# Patient Record
Sex: Male | Born: 1935 | Race: White | Hispanic: No | Marital: Married | Smoking: Former smoker
Health system: Southern US, Community
[De-identification: ages and names within clinical notes are randomized; demographics above are authoritative.]

## PROBLEM LIST (undated history)

## (undated) DIAGNOSIS — I4892 Unspecified atrial flutter: Secondary | ICD-10-CM

## (undated) DIAGNOSIS — F329 Major depressive disorder, single episode, unspecified: Secondary | ICD-10-CM

## (undated) DIAGNOSIS — I779 Disorder of arteries and arterioles, unspecified: Secondary | ICD-10-CM

## (undated) DIAGNOSIS — Z8701 Personal history of pneumonia (recurrent): Secondary | ICD-10-CM

## (undated) DIAGNOSIS — R51 Headache: Secondary | ICD-10-CM

## (undated) DIAGNOSIS — D151 Benign neoplasm of heart: Secondary | ICD-10-CM

## (undated) DIAGNOSIS — J449 Chronic obstructive pulmonary disease, unspecified: Secondary | ICD-10-CM

## (undated) DIAGNOSIS — A389 Scarlet fever, uncomplicated: Secondary | ICD-10-CM

## (undated) DIAGNOSIS — E781 Pure hyperglyceridemia: Secondary | ICD-10-CM

## (undated) DIAGNOSIS — I2510 Atherosclerotic heart disease of native coronary artery without angina pectoris: Secondary | ICD-10-CM

## (undated) DIAGNOSIS — I739 Peripheral vascular disease, unspecified: Secondary | ICD-10-CM

## (undated) DIAGNOSIS — Z9981 Dependence on supplemental oxygen: Secondary | ICD-10-CM

## (undated) DIAGNOSIS — I10 Essential (primary) hypertension: Secondary | ICD-10-CM

## (undated) DIAGNOSIS — C3490 Malignant neoplasm of unspecified part of unspecified bronchus or lung: Secondary | ICD-10-CM

## (undated) DIAGNOSIS — I639 Cerebral infarction, unspecified: Secondary | ICD-10-CM

## (undated) DIAGNOSIS — M1990 Unspecified osteoarthritis, unspecified site: Secondary | ICD-10-CM

## (undated) DIAGNOSIS — R519 Headache, unspecified: Secondary | ICD-10-CM

## (undated) DIAGNOSIS — E119 Type 2 diabetes mellitus without complications: Secondary | ICD-10-CM

## (undated) DIAGNOSIS — K922 Gastrointestinal hemorrhage, unspecified: Secondary | ICD-10-CM

## (undated) DIAGNOSIS — F32A Depression, unspecified: Secondary | ICD-10-CM

## (undated) DIAGNOSIS — K219 Gastro-esophageal reflux disease without esophagitis: Secondary | ICD-10-CM

## (undated) HISTORY — PX: CARDIAC CATHETERIZATION: SHX172

## (undated) HISTORY — DX: Peripheral vascular disease, unspecified: I73.9

## (undated) HISTORY — DX: Cerebral infarction, unspecified: I63.9

## (undated) HISTORY — DX: Malignant neoplasm of unspecified part of unspecified bronchus or lung: C34.90

## (undated) HISTORY — DX: Essential (primary) hypertension: I10

## (undated) HISTORY — DX: Chronic obstructive pulmonary disease, unspecified: J44.9

## (undated) HISTORY — DX: Unspecified osteoarthritis, unspecified site: M19.90

## (undated) HISTORY — DX: Atherosclerotic heart disease of native coronary artery without angina pectoris: I25.10

## (undated) HISTORY — DX: Pure hyperglyceridemia: E78.1

## (undated) HISTORY — DX: Disorder of arteries and arterioles, unspecified: I77.9

## (undated) HISTORY — DX: Personal history of pneumonia (recurrent): Z87.01

## (undated) HISTORY — DX: Benign neoplasm of heart: D15.1

---

## 1998-04-13 ENCOUNTER — Ambulatory Visit (HOSPITAL_COMMUNITY): Admission: RE | Admit: 1998-04-13 | Discharge: 1998-04-13 | Payer: Self-pay | Admitting: Cardiothoracic Surgery

## 1998-04-28 ENCOUNTER — Encounter: Payer: Self-pay | Admitting: Cardiothoracic Surgery

## 1998-05-01 ENCOUNTER — Encounter: Payer: Self-pay | Admitting: Cardiothoracic Surgery

## 1998-05-01 ENCOUNTER — Ambulatory Visit (HOSPITAL_COMMUNITY): Admission: RE | Admit: 1998-05-01 | Discharge: 1998-05-02 | Payer: Self-pay | Admitting: Cardiothoracic Surgery

## 1998-05-01 HISTORY — PX: COMBINED MEDIASTINOSCOPY AND BRONCHOSCOPY: SUR303

## 1998-05-02 ENCOUNTER — Encounter: Payer: Self-pay | Admitting: Cardiothoracic Surgery

## 1998-05-03 ENCOUNTER — Inpatient Hospital Stay (HOSPITAL_COMMUNITY): Admission: RE | Admit: 1998-05-03 | Discharge: 1998-05-09 | Payer: Self-pay | Admitting: Cardiothoracic Surgery

## 1998-05-03 ENCOUNTER — Encounter: Payer: Self-pay | Admitting: Cardiothoracic Surgery

## 1998-05-03 HISTORY — PX: PNEUMONECTOMY: SHX168

## 1998-05-04 ENCOUNTER — Encounter: Payer: Self-pay | Admitting: Cardiothoracic Surgery

## 1998-05-05 ENCOUNTER — Encounter: Payer: Self-pay | Admitting: Cardiothoracic Surgery

## 1998-05-08 ENCOUNTER — Encounter: Payer: Self-pay | Admitting: Cardiothoracic Surgery

## 1999-03-22 ENCOUNTER — Encounter: Payer: Self-pay | Admitting: Cardiothoracic Surgery

## 1999-03-22 ENCOUNTER — Encounter: Admission: RE | Admit: 1999-03-22 | Discharge: 1999-03-22 | Payer: Self-pay | Admitting: Cardiothoracic Surgery

## 1999-05-31 ENCOUNTER — Ambulatory Visit (HOSPITAL_COMMUNITY): Admission: RE | Admit: 1999-05-31 | Discharge: 1999-05-31 | Payer: Self-pay | Admitting: Family Medicine

## 1999-09-20 ENCOUNTER — Encounter: Payer: Self-pay | Admitting: Cardiothoracic Surgery

## 1999-09-20 ENCOUNTER — Encounter: Admission: RE | Admit: 1999-09-20 | Discharge: 1999-09-20 | Payer: Self-pay | Admitting: Cardiothoracic Surgery

## 2000-03-20 ENCOUNTER — Encounter: Admission: RE | Admit: 2000-03-20 | Discharge: 2000-03-20 | Payer: Self-pay | Admitting: Cardiothoracic Surgery

## 2000-03-20 ENCOUNTER — Encounter: Payer: Self-pay | Admitting: Cardiothoracic Surgery

## 2000-09-18 ENCOUNTER — Encounter: Admission: RE | Admit: 2000-09-18 | Discharge: 2000-09-18 | Payer: Self-pay | Admitting: Cardiothoracic Surgery

## 2000-09-18 ENCOUNTER — Encounter: Payer: Self-pay | Admitting: Cardiothoracic Surgery

## 2001-03-12 ENCOUNTER — Encounter: Admission: RE | Admit: 2001-03-12 | Discharge: 2001-03-12 | Payer: Self-pay | Admitting: Cardiothoracic Surgery

## 2001-03-12 ENCOUNTER — Encounter: Payer: Self-pay | Admitting: Cardiothoracic Surgery

## 2001-09-17 ENCOUNTER — Encounter: Payer: Self-pay | Admitting: Cardiothoracic Surgery

## 2001-09-17 ENCOUNTER — Encounter: Admission: RE | Admit: 2001-09-17 | Discharge: 2001-09-17 | Payer: Self-pay | Admitting: Cardiothoracic Surgery

## 2002-10-28 ENCOUNTER — Encounter: Payer: Self-pay | Admitting: Cardiothoracic Surgery

## 2002-10-28 ENCOUNTER — Encounter: Admission: RE | Admit: 2002-10-28 | Discharge: 2002-10-28 | Payer: Self-pay | Admitting: Cardiothoracic Surgery

## 2003-10-19 ENCOUNTER — Emergency Department (HOSPITAL_COMMUNITY): Admission: EM | Admit: 2003-10-19 | Discharge: 2003-10-19 | Payer: Self-pay | Admitting: Emergency Medicine

## 2003-11-03 ENCOUNTER — Encounter: Admission: RE | Admit: 2003-11-03 | Discharge: 2003-11-03 | Payer: Self-pay | Admitting: Cardiothoracic Surgery

## 2004-08-21 ENCOUNTER — Encounter: Admission: RE | Admit: 2004-08-21 | Discharge: 2004-09-13 | Payer: Self-pay | Admitting: Family Medicine

## 2004-11-22 ENCOUNTER — Encounter: Admission: RE | Admit: 2004-11-22 | Discharge: 2004-11-22 | Payer: Self-pay | Admitting: Cardiothoracic Surgery

## 2005-04-22 HISTORY — PX: CAROTID ENDARTERECTOMY: SUR193

## 2005-06-20 HISTORY — PX: TRANSESOPHAGEAL ECHOCARDIOGRAM: SHX273

## 2005-07-04 ENCOUNTER — Inpatient Hospital Stay (HOSPITAL_COMMUNITY): Admission: EM | Admit: 2005-07-04 | Discharge: 2005-07-18 | Payer: Self-pay | Admitting: Emergency Medicine

## 2005-07-05 ENCOUNTER — Encounter (INDEPENDENT_AMBULATORY_CARE_PROVIDER_SITE_OTHER): Payer: Self-pay | Admitting: Interventional Cardiology

## 2005-07-05 ENCOUNTER — Encounter (INDEPENDENT_AMBULATORY_CARE_PROVIDER_SITE_OTHER): Payer: Self-pay | Admitting: *Deleted

## 2005-07-12 ENCOUNTER — Encounter (INDEPENDENT_AMBULATORY_CARE_PROVIDER_SITE_OTHER): Payer: Self-pay | Admitting: Specialist

## 2005-07-12 HISTORY — PX: CORONARY ARTERY BYPASS GRAFT: SHX141

## 2005-07-21 HISTORY — PX: CARDIOVERSION: SHX1299

## 2005-08-16 ENCOUNTER — Inpatient Hospital Stay (HOSPITAL_COMMUNITY): Admission: EM | Admit: 2005-08-16 | Discharge: 2005-08-19 | Payer: Self-pay | Admitting: Emergency Medicine

## 2005-09-10 ENCOUNTER — Encounter (INDEPENDENT_AMBULATORY_CARE_PROVIDER_SITE_OTHER): Payer: Self-pay | Admitting: Specialist

## 2005-09-10 ENCOUNTER — Encounter: Admission: RE | Admit: 2005-09-10 | Discharge: 2005-09-10 | Payer: Self-pay | Admitting: General Surgery

## 2005-09-10 ENCOUNTER — Other Ambulatory Visit: Admission: RE | Admit: 2005-09-10 | Discharge: 2005-09-10 | Payer: Self-pay | Admitting: Interventional Radiology

## 2005-11-07 ENCOUNTER — Encounter: Admission: RE | Admit: 2005-11-07 | Discharge: 2005-11-07 | Payer: Self-pay | Admitting: Cardiothoracic Surgery

## 2005-12-21 HISTORY — PX: RENAL ANGIOGRAM: SHX6061

## 2005-12-30 ENCOUNTER — Ambulatory Visit (HOSPITAL_COMMUNITY): Admission: RE | Admit: 2005-12-30 | Discharge: 2005-12-30 | Payer: Self-pay | Admitting: Interventional Cardiology

## 2006-08-05 ENCOUNTER — Ambulatory Visit: Payer: Self-pay | Admitting: Vascular Surgery

## 2006-10-23 ENCOUNTER — Encounter: Admission: RE | Admit: 2006-10-23 | Discharge: 2006-10-23 | Payer: Self-pay | Admitting: Cardiothoracic Surgery

## 2006-10-23 ENCOUNTER — Ambulatory Visit: Payer: Self-pay | Admitting: Cardiothoracic Surgery

## 2007-06-02 ENCOUNTER — Emergency Department (HOSPITAL_COMMUNITY): Admission: EM | Admit: 2007-06-02 | Discharge: 2007-06-03 | Payer: Self-pay | Admitting: Emergency Medicine

## 2007-07-27 ENCOUNTER — Encounter: Admission: RE | Admit: 2007-07-27 | Discharge: 2007-07-27 | Payer: Self-pay | Admitting: Cardiology

## 2007-07-28 ENCOUNTER — Ambulatory Visit: Payer: Self-pay | Admitting: Vascular Surgery

## 2008-05-17 ENCOUNTER — Encounter: Admission: RE | Admit: 2008-05-17 | Discharge: 2008-06-02 | Payer: Self-pay | Admitting: Family Medicine

## 2008-09-13 ENCOUNTER — Ambulatory Visit: Payer: Self-pay | Admitting: Vascular Surgery

## 2008-10-06 ENCOUNTER — Ambulatory Visit: Payer: Self-pay | Admitting: Cardiothoracic Surgery

## 2009-10-05 ENCOUNTER — Ambulatory Visit: Payer: Self-pay | Admitting: Cardiothoracic Surgery

## 2009-10-05 ENCOUNTER — Encounter: Admission: RE | Admit: 2009-10-05 | Discharge: 2009-10-05 | Payer: Self-pay | Admitting: Cardiothoracic Surgery

## 2009-10-17 ENCOUNTER — Ambulatory Visit: Payer: Self-pay | Admitting: Vascular Surgery

## 2010-01-19 ENCOUNTER — Observation Stay (HOSPITAL_COMMUNITY): Admission: EM | Admit: 2010-01-19 | Discharge: 2010-01-22 | Payer: Self-pay | Admitting: Emergency Medicine

## 2010-01-21 ENCOUNTER — Ambulatory Visit: Payer: Self-pay | Admitting: Vascular Surgery

## 2010-01-21 ENCOUNTER — Encounter (INDEPENDENT_AMBULATORY_CARE_PROVIDER_SITE_OTHER): Payer: Self-pay | Admitting: Internal Medicine

## 2010-05-13 ENCOUNTER — Encounter: Payer: Self-pay | Admitting: Cardiothoracic Surgery

## 2010-07-05 LAB — BASIC METABOLIC PANEL
CO2: 30 mEq/L (ref 19–32)
Calcium: 9 mg/dL (ref 8.4–10.5)
Calcium: 9.1 mg/dL (ref 8.4–10.5)
Creatinine, Ser: 1.04 mg/dL (ref 0.4–1.5)
GFR calc Af Amer: >60 mL/min (ref >60)
GFR calc Af Amer: >60 mL/min (ref >60)
GFR calc non Af Amer: >60 mL/min (ref >60)
GFR calc non Af Amer: >60 mL/min (ref >60)
Glucose, Bld: 105 mg/dL — ABNORMAL HIGH (ref 70–99)
Potassium: 3.7 mEq/L (ref 3.5–5.1)
Sodium: 133 mEq/L — ABNORMAL LOW (ref 135–145)

## 2010-07-05 LAB — LIPASE, BLOOD: Lipase: 33 U/L (ref 11–59)

## 2010-07-05 LAB — CBC
HCT: 39.7 % (ref 39.0–52.0)
HCT: 42.7 % (ref 39.0–52.0)
HCT: 42.7 % (ref 39.0–52.0)
Hemoglobin: 13.2 g/dL (ref 13.0–17.0)
Hemoglobin: 13.9 g/dL (ref 13.0–17.0)
Hemoglobin: 14.3 g/dL (ref 13.0–17.0)
MCH: 31.1 pg (ref 26.0–34.0)
MCHC: 32.6 g/dL (ref 30.0–36.0)
MCHC: 33.2 g/dL (ref 30.0–36.0)
MCHC: 33.5 g/dL (ref 30.0–36.0)
MCV: 93.6 fL (ref 78.0–100.0)
RBC: 4.24 MIL/uL (ref 4.22–5.81)
RBC: 4.52 MIL/uL (ref 4.22–5.81)
RBC: 4.58 MIL/uL (ref 4.22–5.81)
WBC: 10.6 10*3/uL — ABNORMAL HIGH (ref 4.0–10.5)

## 2010-07-05 LAB — MAGNESIUM: Magnesium: 2.2 mg/dL (ref 1.5–2.5)

## 2010-07-05 LAB — POCT CARDIAC MARKERS
Myoglobin, poc: 125 ng/mL (ref 12–200)
Troponin i, poc: <0.05 ng/mL (ref 0.00–0.09)

## 2010-07-05 LAB — LIPID PANEL
Cholesterol: 217 mg/dL — ABNORMAL HIGH (ref 0–200)
HDL: 43 mg/dL (ref >39)
LDL Cholesterol: 131 mg/dL — ABNORMAL HIGH (ref 0–99)
Total CHOL/HDL Ratio: 5 RATIO
Triglycerides: 215 mg/dL — ABNORMAL HIGH (ref <150)

## 2010-07-05 LAB — CARDIAC PANEL(CRET KIN+CKTOT+MB+TROPI)
CK, MB: 3.2 ng/mL (ref 0.3–4.0)
CK, MB: 3.6 ng/mL (ref 0.3–4.0)
Relative Index: 2.1 (ref 0.0–2.5)
Relative Index: 2.6 — ABNORMAL HIGH (ref 0.0–2.5)

## 2010-07-05 LAB — GLUCOSE, CAPILLARY
Glucose-Capillary: 107 mg/dL — ABNORMAL HIGH (ref 70–99)
Glucose-Capillary: 114 mg/dL — ABNORMAL HIGH (ref 70–99)
Glucose-Capillary: 127 mg/dL — ABNORMAL HIGH (ref 70–99)
Glucose-Capillary: 86 mg/dL (ref 70–99)
Glucose-Capillary: 89 mg/dL (ref 70–99)

## 2010-07-05 LAB — TSH: TSH: 1.396 u[IU]/mL (ref 0.350–4.500)

## 2010-07-05 LAB — PROTIME-INR: Prothrombin Time: 13.2 seconds (ref 11.6–15.2)

## 2010-07-05 LAB — COMPREHENSIVE METABOLIC PANEL
ALT: 22 U/L (ref 0–53)
AST: 34 U/L (ref 0–37)
Alkaline Phosphatase: 61 U/L (ref 39–117)
CO2: 30 mEq/L (ref 19–32)
Calcium: 9.3 mg/dL (ref 8.4–10.5)
Chloride: 98 mEq/L (ref 96–112)
GFR calc Af Amer: >60 mL/min (ref >60)
GFR calc non Af Amer: >60 mL/min (ref >60)
Potassium: 3.9 mEq/L (ref 3.5–5.1)
Sodium: 137 mEq/L (ref 135–145)

## 2010-07-05 LAB — POCT I-STAT, CHEM 8
BUN: 18 mg/dL (ref 6–23)
Calcium, Ion: 1.17 mmol/L (ref 1.12–1.32)
Creatinine, Ser: 1.1 mg/dL (ref 0.4–1.5)
TCO2: 31 mmol/L (ref 0–100)

## 2010-07-05 LAB — DIFFERENTIAL
Basophils Relative: 1 % (ref 0–1)
Eosinophils Absolute: 0.3 10*3/uL (ref 0.0–0.7)
Eosinophils Relative: 3 % (ref 0–5)
Lymphs Abs: 1.9 10*3/uL (ref 0.7–4.0)

## 2010-07-05 LAB — D-DIMER, QUANTITATIVE: D-Dimer, Quant: 2.7 ug/mL-FEU — ABNORMAL HIGH (ref 0.00–0.48)

## 2010-07-05 LAB — T4, FREE: Free T4: 1.24 ng/dL (ref 0.80–1.80)

## 2010-07-05 LAB — CK TOTAL AND CKMB (NOT AT ARMC)
CK, MB: 4.5 ng/mL — ABNORMAL HIGH (ref 0.3–4.0)
Total CK: 151 U/L (ref 7–232)

## 2010-09-04 NOTE — Procedures (Signed)
CAROTID DUPLEX EXAM   INDICATION:  Carotid artery disease.   HISTORY:  Diabetes:  Yes.  Cardiac:  CABG.  Hypertension:  Yes.  Smoking:  Previous.  Previous Surgery:  Right carotid endarterectomy on 07/12/2005.  CV History:  Asymptomatic.  Amaurosis Fugax No, Paresthesias No, Hemiparesis No                                       RIGHT             LEFT  Brachial systolic pressure:         128               130  Brachial Doppler waveforms:         Normal            Normal  Vertebral direction of flow:        Antegrade         Antegrade  DUPLEX VELOCITIES (cm/sec)  CCA peak systolic                   83                117  ECA peak systolic                   99                132  ICA peak systolic                   58                167  ICA end diastolic                   16                45  PLAQUE MORPHOLOGY:                  Mixed             Calcific  PLAQUE AMOUNT:                      Mild              Moderate  PLAQUE LOCATION:                    CCA               ICA/ECA/CCA   IMPRESSION:  1. Patent right carotid endarterectomy site with no right internal      carotid artery stenosis noted.  2. Doppler velocity suggests a 40-59% stenosis of the left internal      carotid artery; however, this may be underestimated due to calcific      shadowing.  3. Velocities of the left internal carotid artery appear less than      previously recorded when compared to the previous exam on      07/28/2007 with the right internal carotid artery remaining stable.       ___________________________________________  Quita Skye. Hart Rochester, M.D.   CH/MEDQ  D:  09/14/2008  T:  09/14/2008  Job:  [id number]

## 2010-09-04 NOTE — Procedures (Signed)
CAROTID DUPLEX EXAM   INDICATION:  Followup, carotid artery disease.   HISTORY:  Diabetes:  No.  Cardiac:  CABG on 07/12/05.  Hypertension:  Yes.  Smoking:  Quit.  Previous Surgery:  Right CEA with DPA on 07/12/05 by Dr. Hart Rochester.  CV History:  Asymptomatic.  Amaurosis Fugax No, Paresthesias No, Hemiparesis No                                       RIGHT             LEFT  Brachial systolic pressure:         160               158  Brachial Doppler waveforms:         Biphasic          Biphasic  Vertebral direction of flow:        Antegrade         Antegrade  DUPLEX VELOCITIES (cm/sec)  CCA peak systolic                   91                114  ECA peak systolic                   177               197  ICA peak systolic                   83                207  ICA end diastolic                   26                56  PLAQUE MORPHOLOGY:                  Mixed             Calcified with  shadow  PLAQUE AMOUNT:                      Mild              Moderate  PLAQUE LOCATION:                    ECA/CCA           ICA/ECA   IMPRESSION:  1. Right internal carotid artery shows no evidence of restenosis,      status post carotid endarterectomy.  2. Left internal carotid artery shows evidence of 60-79% stenosis (low      end of range) an increase from the previous study.  3. Bilateral external carotid artery stenosis.  4. Known right thyroid nodule noted.     ___________________________________________  Quita Skye Hart Rochester, M.D.   AS/MEDQ  D:  07/28/2007  T:  07/28/2007  Job:  [id number]

## 2010-09-04 NOTE — Assessment & Plan Note (Signed)
OFFICE VISIT   Calvin [name], Calvin [name]  DOB:  1932-11-25                                        October 05, 2009  CHART #:  [id number]   HISTORY:  The patient returns to the office today in followup for his  lung cancer and history of myxoma resection.  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  After consultation with Oncology, he received no  further treatment and now is 11 years post resection without evidence of  recurrence.  In 2007, he underwent coronary artery bypass grafting,  excision of an atrial myxoma, and concomitant right carotid  endarterectomy.  Considering his previous history and age of 51 years,  he is doing reasonably well, though he still bothered by exertional  shortness of breath.  He is able to do some yard work, but notes that  climbing stairs or climbing hills quickly fatigues him.  He has had no  hemoptysis, no weight loss.   REVIEW OF SYSTEMS:  The patient denies constitutional symptoms.  Does  have shortness of breath.  Denies hemoptysis.  Denies amaurosis or TIAs.  He used to see Dr. Hart Rochester in several weeks for followup carotid  ultrasound.  He denies change in bowel habits.  Denies any blood in  urine.  Denies pedal edema.  Denies psychiatric history.   CURRENT MEDICATIONS:  He continues on:  1. Bystolic 10 mg a day.  2. Felodipine 10 mg a day.  3. Metformin 500 mg a day.  4. Aspirin 325 a day.  5. Multivitamin.  6. Fish oil.  7. Combivent.  8. He had been on Crestor, currently not on statin because of muscle      aches.  Also, on losartan and hydrochlorothiazide.   PHYSICAL EXAMINATION:  General:  The patient appears unchanged from seen  last year.  Considering his age, he still seems fairly robust.  Vital  Signs:  Blood pressure is 139/80, pulse is 72, respiratory rate is 18,  and O2 sat on room air is 88%.  Lungs:  His right lung is clear.  He  does not have any  cervical, supraclavicular, or axillary adenopathy.  Cardiac:  Regular rate and rhythm.  I do not appreciate any murmur.  Abdomen:  Moderate obesity without palpable masses.  Extremities:  Lower  extremities are without swelling.  He does have +1 DP and PT pulses.   DIAGNOSTIC TESTS:  Chest x-ray shows only postoperative findings of the  left pneumonectomy, otherwise are unchanged.   IMPRESSION:  The patient now 11 years after resection of a T2 N1 M0 3.2-  cm squamous cell carcinoma of the lung and subsequent coronary artery  bypass and myxoma resection with concomitant right carotid  endarterectomy in 2007.  Overall, the patient continues to do well.  He  is currently not on home oxygen and doing relatively well, though if he  becomes more dyspneic, may require home oxygen with.  He notes that he  is to have a followup echocardiogram done in Dr. Amil Amen' office next  week.  I will plan to see him back in 1 year with a followup chest x-  ray.   Sheliah Plane, MD  Electronically Signed   EG/MEDQ  D:  10/05/2009  T:  10/05/2009  Job:  [id number]   cc:   Quita Skye. Hart Rochester, M.D.  Francisca December, M.D.

## 2010-09-04 NOTE — Assessment & Plan Note (Signed)
OFFICE VISIT   Calvin [name], Calvin [name]  DOB:  03/08/1933                                       09/13/2008  EAVWU#:[id number]   The patient returns today for his annual follow-up regarding his carotid  occlusive disease.  He underwent a right carotid endarterectomy by me in  conjunction with removal of an atrial myxoma with Dr. Tyrone Sage in 2007.  His right carotid has remained widely patent and duplex scan today  continues to show no evidence of restenosis.  He does have some moderate  narrowing in the left internal carotid, which we have been following,  and again today the stenosis appears stable.  He denies any neurologic  symptoms such as aphasia, amaurosis fugax, diplopia, blurred vision or  syncope.  He is also not having any chest pain but does have some mild  dyspnea on exertion since he only has 1 more.  Lower extremity symptoms  are absent, including rest pain, claudication or infection.  He is  taking 1 aspirin a day.   PHYSICAL EXAMINATION:  Blood pressure is 144/68, heart rate 68,  respirations 14.  His carotid pulses are 3+ with soft bruit on the left.  Cardiovascular exam reveals a regular rhythm, no murmurs.  Abdomen is  soft, nontender, with no masses.  Has 3+ femoral and dorsalis pedis  pulses bilaterally.   I reassured him regarding these carotid findings, and we will see him on  an annual basis on the carotid protocol to follow the moderate left  internal carotid stenosis.  If he develops any symptoms in the interim,  he will be in touch with [place].   Quita Skye Hart Rochester, M.D.  Electronically Signed   JDL/MEDQ  D:  09/13/2008  T:  09/14/2008  Job:  [id number]

## 2010-09-04 NOTE — Assessment & Plan Note (Signed)
OFFICE VISIT   Calvin [name], KLUTTZ  DOB:  03/26/1933                                        October 23, 2006  CHART #:  [id number]   [name] returns today for a followup visit primarily for followup  yearly visit for previous carcinoma of the lung which was resected in  January of 2000, invasive squamous cell carcinoma, 3.2 cm, for which a  pneumonectomy was performed.  Since that time the patient has had no  evidence of recurrence, now more than 8 years later.  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  In  spite of this, he seems to continue to do reasonably well.  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   He had been complaining of headaches and this is being evaluated.  A CT  scan of the head was done.  He also notes that an echocardiogram was  done Aug 28, 2006, by Dr. Amil Amen.  I do not have the reports on these,  but the patient notes that he was told they were okay.   On exam today, his blood pressure 152/72, pulse of 56, respiratory rate  is 18, O2 sat is 93%.  Sternum:  Stable and well healed.  I do not  appreciate any murmurs.  Right Lung:  Breath sounds are clear.  He has  no pedal edema.   Followup chest x-ray shows no evidence of recurrence.   The patient continues on amiodarone 200 mg a day.  He will ask Dr.  Amil Amen about decreasing or potentially stopping this if it is no longer  felt to be necessary.  He continues using Combivent 2 puffs once a day  and rarely a second time per day.   Overall, I am very pleased with his progress.  I plan to see him back in  1 year with a followup chest x-ray.   Sheliah Plane, MD  Electronically Signed   EG/MEDQ  D:  10/23/2006  T:  10/23/2006  Job:  [id number]   cc:   Francisca December, M.D.  Vianne Bulls, M.D.

## 2010-09-04 NOTE — Assessment & Plan Note (Signed)
OFFICE VISIT   TRASON, SHIFFLET  DOB:  10-Dec-1932                                       07/28/2007  ZOXWR#:[id number]   Patient is two years status post right carotid endarterectomy done in  conjunction with coronary artery bypass grafting and removal of an  atrial myxoma by Dr. Tyrone Sage.  He has been followed for a moderate left  internal carotid stenosis, which remains asymptomatic.  He denies any  neurologic symptoms such as hemiparesis, aphasia, amaurosis fugax,  diplopia, blurred vision, or syncope.  He complains of some left thigh  discomfort which feels tight, particularly at night when he goes to  bed, with no calf or foot symptoms and no numbness or tingling.  He has  no history of nonhealing ulcers.  He has no specific claudication-type  symptoms.  He does take one aspirin per day.   PHYSICAL EXAMINATION:  Blood pressure 151/72, heart rate 65,  respirations 14.  His carotid pulses are 3+ with soft bruit on the left.  Neurologic exam is normal.  No palpable adenopathy in the neck.  Chest:  Clear to auscultation.  Abdomen is soft and nontender with no masses.  He has 3+ femoral, popliteal, and posterior tibial pulses palpable  bilaterally.   Carotid duplex exam reveals slight worsening of his left internal  carotid artery stenosis in the 70% range with the right internal carotid  being widely patent post endarterectomy.  He was reassured regarding  these findings and will return in one year for a follow-up carotid  duplex exam unless he has symptoms in the interim.   Quita Skye Hart Rochester, M.D.  Electronically Signed   JDL/MEDQ  D:  07/28/2007  T:  07/29/2007  Job:  [id number]   cc:   C. Duane Lope, M.D.

## 2010-09-04 NOTE — Assessment & Plan Note (Signed)
OFFICE VISIT   Calvin, [name]  DOB:  Jul 10, 1932                                        October 06, 2008  CHART #:  [id number]   The patient returns to the office today for followup visit and review of  his chest x-ray.  He is a patient well-known, who in January 2000  underwent a left pneumonectomy for a 3.2-cm T2, N1, M0 invasive squamous  cell carcinoma after consultation with Oncology.  The patient received  no further treatment and has done well from an Oncology point of view  without evidence of recurrence, now 10 years following resection.  In  2007 he underwent coronary artery bypass grafting and excision of atrial  myxoma.  Overall, the patient continues to do reasonably well.  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   PHYSICAL EXAMINATION:  VITAL SIGNS:  His blood pressure is 125/69, pulse  64, respiratory rate 18, and O2 sats 90%.  LUNGS:  He has no breath sounds over the left, clear over the right  without wheezing.  NECK:  I do not appreciate any cervical or supraclavicular adenopathy.  He has a well-healed right carotid endarterectomy incision.  CARDIAC:  I do not appreciate any murmur or gallop.  ABDOMEN:  Protuberant abdomen without palpable masses.  EXTREMITIES:  He has no edema in lower extremity.   Chest x-ray shows opacity of the left lung, left pleural space.  There  is no evidence of lesions in the right lung.   The patient continues on Bystolic, Benicar, felodipine, metformin,  aspirin, multivitamins, fish oil, Combivent, Crestor, Zyrtec, and Zantac  p.r.n.  Overall, he seems to be doing reasonably well, but with  borderline O2 saturations at rest, but remaining active.  I discussed  with the patient if he continues to have shortness of breath with  exertion to discuss with Dr. Tenny Craw' referral to  Pulmonology and consideration of home oxygen or  modification of his  current bronchodilator therapy.  I will plan to see him back in 1 year  with a followup chest x-ray.   Sheliah Plane, MD  Electronically Signed   EG/MEDQ  D:  10/06/2008  T:  10/07/2008  Job:  [id number]   cc:   C. Duane Lope, M.D.  Francisca December, M.D.

## 2010-09-04 NOTE — Procedures (Signed)
CAROTID DUPLEX EXAM   INDICATION:  Follow up known carotid disease.   HISTORY:  Diabetes:  Yes.  Cardiac:  CABG.  Hypertension:  Yes.  Smoking:  Previous.  Previous Surgery:  Right carotid endarterectomy on 07/12/05.  CV History:  Asymptomatic.  Amaurosis Fugax No, Paresthesias No, Hemiparesis No.                                       RIGHT             LEFT  Brachial systolic pressure:         130               130  Brachial Doppler waveforms:           Normal          Normal  Vertebral direction of flow:        Antegrade         Antegrade  DUPLEX VELOCITIES (cm/sec)  CCA peak systolic                   123               129  ECA peak systolic                   212               216  ICA peak systolic                   122               173  ICA end diastolic                   27                56  PLAQUE MORPHOLOGY:                  Mixed             Calcific  PLAQUE AMOUNT:                      Mild              Moderate  PLAQUE LOCATION:                    CCA               ICA, CCA, ECA   IMPRESSION:  1. Patent right carotid, post carotid endarterectomy.  2. Doppler velocities suggest 40% to 59% stenosis in the left internal      carotid artery.  3. Antegrade flow in bilateral vertebral arteries.  4. No significant changes from previous examination.   ___________________________________________  Quita Skye Hart Rochester, M.D.   NT/MEDQ  D:  10/17/2009  T:  10/17/2009  Job:  [id number]

## 2010-09-07 NOTE — Consult Note (Signed)
NAME:  Calvin [name], Calvin [name] NO.:  [id number]   MEDICAL RECORD NO.:  [id number]          PATIENT TYPE:  INP   LOCATION:  3729                         FACILITY:  [facility]   PHYSICIAN:  Sheliah Plane, MD    DATE OF BIRTH:  05-Aug-1932   DATE OF CONSULTATION:  07/05/2005  DATE OF DISCHARGE:  02/03/2005                                   CONSULTATION   FOLLOWUP CARDIOLOGIST:  Lyn Records, M.D.   PRIMARY CARE PHYSICIAN:  C. Duane Lope, M.D.   REASON FOR CONSULTATION:  Question left atrial mass.   HISTORY OF PRESENT ILLNESS:  The patient is a 77 year old male known to me  for at least the past 7 years, who presented with carcinoma of the left lung  and underwent left pneumonectomy. Since that time he has been seen on a  regular basis with followup chest x-rays and has done well with the  exception of mild shortness of breath with exertion. He comes to the  emergency room on July 04, 2005, because of worsening left-sided chest pain  and increasing shortness of breath especially at rest which he has not  previously had. He admitted that over the past several days having some  nausea, dizziness and on-and-off headaches.   PAST MEDICAL HISTORY:  1.  History of lung cancer, resected with pneumonectomy 7 years ago. The      patient's old chart is still not available from [facility] and will      have to obtain these records from my office.  2.  History of hypertension.  3.  History of hypertriglyceridemia. He denies diabetes.   PAST SURGICAL HISTORY:  As noted above; no other surgery.   SOCIAL AND FAMILY HISTORY:  The patient smoked for 40 years, quit prior to  his pneumonectomy and has not smoked since. He denies alcohol use. He worked  as a Naval architect. He notes he has a sister with heart rate problems. Mother  died of myocardial infarction. Father died of stomach cancer.   ALLERGIES:  AMOXICILLIN, BEE VENOM.   MEDICATIONS:  1.  Aspirin 81 mg a day.  2.   Multivitamins.  3.  Zetia 10 mg a day.  4.  Combivent 2 puffs.  5.  Benicar 40/12.5.   REVIEW OF SYSTEMS:  The patient denies any change in weight. He denies any  fever, chills, night sweats. As noted above, he has had increasing shortness  of breath even at rest which is new. Denies hemoptysis. Does occasionally  have wheezing. He denies vomiting, but has recently had nausea. Wife relates  that his blood pressure has been up and down. Has had no melena,  hematochezia or diarrhea. He denies psychiatric history, denies  claudication, denies amaurosis or TIAs. He does have a history of some  carotid disease which he has been followed by carotid Doppler's in the past  by Dr. Hart Rochester.   PHYSICAL EXAMINATION:  VITAL SIGNS:  The patient's temperature is 98.6,  blood pressure is 120/70, pulse of 80, respiratory rate 18, saturations 95%  on room air.  GENERAL:  The patient is awake and alert, neurologically intact, just  completing a transesophageal echocardiogram. He is hard of hearing.  NECK:  Without jugular venous distention.  CHEST:  Has no breath sounds on the left. There is no wheezing over the  right. The left chest incision is well healed.  HEART:  He has a regular S1 and S2 with no S3 or S4. He has an early  systolic murmur heard along the left sternal border.  ABDOMEN:  Soft, nontender, without palpable masses or tenderness.  NEUROLOGIC EXAM:  Grossly intact.   LABORATORY FINDINGS:  On admission showed hematocrit of 40. Creatinine of  1.0. Fibrin split products were elevated at 1.7. Chest x-ray is unchanged  from previous films, with postoperative changes. Since admission it was  noted the patient had an elevated D-dimer. A CT scan was done to rule out  possibility of pulmonary embolus. There was no evidence of pulmonary  embolus. He had some marginally enlarged lymph nodes and question of  bilateral adenomas, adrenal adenomas. There was a question of a thrombus in  the left  atrium which has led to echocardiography. He had a TEE this  afternoon which he just returned from that has a 1.5-cm pedunculated mass in  the left atrium, smooth appearing, freely mobile, but not interfering with  the function of the mitral valve; there is very trace mitral regurgitation.  The patient also had an adenosine stress test which was negative for  ischemia.   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 There is an outside  possibility that this mass could be clot, but it certainly appears with  echocardiography to be a myxoma.   RECOMMENDATION:  At this point I would recommend obtaining pulmonary  function studies, cardiology to continue to work up including cardiac  catheterization and presumably will lead to cardiac surgery for removal of  the myxoma. However, before surgery, because of his age of 40 years, history  of smoking, he would be at high risk for coronary disease in spite of a  negative adenosine stress test. After this information is obtained, we will  further discuss with the patient his options.      Sheliah Plane, MD  Electronically Signed     EG/MEDQ  D:  07/05/2005  T:  07/05/2005  Job:  [id number]

## 2010-09-07 NOTE — Op Note (Signed)
NAME:  Calvin [name], Calvin [name] NO.:  [id number]   MEDICAL RECORD NO.:  [id number]          PATIENT TYPE:  INP   LOCATION:  3742                         FACILITY:  [facility]   PHYSICIAN:  Francisca December, M.D.  DATE OF BIRTH:  [birth year]   DATE OF PROCEDURE:  08/16/2005  DATE OF DISCHARGE:                                 OPERATIVE REPORT   PROCEDURE:  Direct current cardioversion.   INDICATIONS:  Mr. [name] is a 77 year old man now five weeks S/P single-  vessel CABG, left atrial myxoma excision, bilateral carotid endarterectomy,  with a history of normal LV systolic function.  He had the abrupt onset of  tachypalpitation yesterday evening.  He came to the hospital and was found  to be in atrial flutter with 2:1 block.  He has been resistant to IV  amiodarone and IV Cardizem as far as heart rate control.  He is to undergo  elective cardioversion at this time.   ANESTHESIA:  Quita Skye. Krista Blue, M.D., 150 mg of Pentothal IV.   FINDINGS:  While monitoring, heart rate, blood pressure, O2 saturation and  ECG and under the direct supervision of Dr. Claybon Jabs of the anesthesia  department, the patient received 150 mg of IV Pentothal.  After establishing  deep anesthesia, the patient received a single dose of transthoracic  biphasic synchronized direct current energy at 50 joules.  There was prompt  return to sinus rhythm.   IMPRESSION:  Successful elective cardioversion, atrial flutter 2:1 block to  sinus rhythm.   PLAN:  Continue IV amiodarone and IV Cardizem, the latter at 5 mg/hr.  Begin  p.o. amiodarone.  Continue Lovenox x24 hours more and continue telemetry  monitoring.      Francisca December, M.D.  Electronically Signed     JHE/MEDQ  D:  08/16/2005  T:  08/17/2005  Job:  [id number]

## 2010-09-07 NOTE — Op Note (Signed)
NAME:  Calvin [name], Calvin [name] NO.:  [id number]   MEDICAL RECORD NO.:  [id number]          PATIENT TYPE:  INP   LOCATION:  2306                         FACILITY:  [facility]   PHYSICIAN:  Burna Forts, M.D.DATE OF BIRTH:  08-Jul-1932   DATE OF PROCEDURE:  07/12/2005  DATE OF DISCHARGE:                                 OPERATIVE REPORT   PROCEDURE PERFORMED:  Transesophageal echocardiogram.   INDICATIONS FOR PROCEDURE:  Mr. Kamel is a 77 year old gentleman who presents  today for coronary artery bypass grafting, carotid endarterectomy and  possible excision of an atrial myxoma.  This is being performed by Sheliah Plane, MD.  On the morning of surgery the patient was brought to the  holding area where under local anesthesia with sedation, pulmonary catheter  and radial arterial lines were placed.  He was taken to the operating room  for routine induction of general anesthesia.  After induction, the TEE probe  was protected, lubricated and passed oropharyngeally into the stomach and  slightly withdrawn for imaging of the cardiac structures.   PRE CARDIOPULMONARY BYPASS TEE EXAMINATION:  Left ventricle.  This was  mildly thickened, concentrically thickened.  Left ventricular chamber seen  in a short axis view.  Papillary muscles are well outlined.  There is good  overall contractility with just some suggestion of inferior wall  hypokinesis.  Overall assessment of ejection fraction would be good and  above 40%.  Long axis views were obtained as well, again showing well  anterior posterior aspects of the heart in the apical areas.  Again,  satisfactory contractility is noted.   Mitral valve.  There is a mildly thickened anterior leaflet noted.  Posterior leaflet was also seen with just a mild degree of prolapse.  There  is good overall opening of the mitral valve during diastolic inflow and  appropriate coaptation during the systolic ejection phase.  Doppler  examination  reveals only trace mitral regurgitant flow noted.  Several small  jets were noted in multiple views that were obtained.   Left atrium.  In the left atrial chamber now is seen a fairly large circular  to round solid mass that appears to be attached by a very small stalk just  behind the area of the left atrium in the proximity of the aortic valve on  the screen.  It appears to be near the interatrial septal area of the wall.  It does appear to be on a small stalk.  It is quite mobile and moving  throughout the left atrial chamber.  Its size is approximately 2.2 cm in  length and about 1.5 cm in its diameter.  The interatrial septum wall is not  able to be visualized completely.  There do not appear to be shunts or  regurgitant flow areas noted.  The size of this mass, however, does not  obstruct mitral inflow.   Aortic valve.  This is a mildly aortic sclerotic looking valve.  The three  cusps are seen well.  Edges were seen well, satisfactorily and appropriately  during systolic ejection.  There is no ejection flow during systolic outflow  and there is no diastolic insufficiency noted.   Right ventricle.  Normal right ventricular chamber is appreciated.   Tricuspid valve.  Normal, thin, compliant, mobile, tricuspid valve.   Right atrium.  Normal right atrial chamber.  Pulmonary artery catheter noted  within.   Patient placed on cardiopulmonary bypass.  Coronary artery bypass grafting  was carried out.  Carotid endarterectomy had already been performed.  The  atrial area had been incised and the atrial myxoma was excised by Dr.  Tyrone Sage.  Deairing maneuvers were carried out.  Closures carried out.  Patient was rewarmed and separated from cardiopulmonary bypass with the  initial attempt.   POST CARDIOPULMONARY BYPASS TEE EXAMINATION:  Left ventricle.  Newly  bypassed.  There is small left ventricular chamber indicative of a low  volume status; however, there is good excellent left  ventricular chamber  function seen in the short axis view with all segmental wall areas  contractile and thickening.   Mitral valve.  Essentially unchanged from the prebypass period.   Left atrium.  The area of the myxoma is interrogated in detail.  There did  not appear to be any remnants, fragments or additional shunt areas of  imperfections noted in the area of the left atrial chamber where the myxoma  was excised.  Obviously the myxoma itself had been removed.   Rest of the cardiac examination was as previously described.  Patient did  satisfactory and was returned to cardiac intensive care unit in stable  condition.           ______________________________  Burna Forts, M.D.     JTM/MEDQ  D:  07/12/2005  T:  07/15/2005  Job:  [id number]

## 2010-09-07 NOTE — Discharge Summary (Signed)
NAME:  Calvin [name], Calvin [name] NO.:  [id number]   MEDICAL RECORD NO.:  [id number]          PATIENT TYPE:  INP   LOCATION:  2008                         FACILITY:  [facility]   PHYSICIAN:  Sheliah Plane, MD    DATE OF BIRTH:  [birth year]   DATE OF ADMISSION:  07/04/2005  DATE OF DISCHARGE:  07/18/2005                                 DISCHARGE SUMMARY   PRIMARY DIAGNOSES:  1.  Critical carotid artery disease.  2.  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  3.  Left atrial myxoma.   IN-HOSPITAL DIAGNOSES:  1.  Diabetes mellitus type 2.  2.  Acute blood loss anemia postoperatively.  3.  Volume overload postoperatively.   SECONDARY DIAGNOSES:  1.  Status post left pneumonectomy for squamous cell carcinoma seven years      ago.  2.  Hypertension.  3.  Hyperlipidemia.   ALLERGIES:  AMOXICILLIN and BEE VENOM.   IN-HOSPITAL OPERATIONS/PROCEDURES:  1.  Cardiac catheterization with left heart catheterization, coronary      angiography, left ventriculogram.  2.  Right carotid endarterectomy with Dacron patch angioplasty.  3.  Coronary artery bypass grafting x1 with reverse saphenous vein graft to      the distal right coronary artery.  4.  Excision of left atrial myxoma.  5.  Intraoperative transesophageal echocardiogram.   HISTORY AND PHYSICAL/HOSPITAL COURSE:  The patient is a 77 year old male who  approximately seven years ago had undergo left pneumonectomy for squamous  cell carcinoma of the lung.  The patient has done well since.  He recently  presented to the emergency room with onset of right chest pain.  CT scan was  done to rule out pulmonary embolism which showed to be negative.  No  evidence of recurrent carcinoma was noted as well.  It did show a vague  filling defect in left atrium.  Following CT scan, echocardiogram was  ordered including a transesophageal echocardiogram which confirmed a  moderate-sized left atrial myxoma.  Following  this, a cardiac  catheterization was done and this was showed the patient to have sequential  critical right coronary artery lesions.  He was also found to have severe  carotid disease greater than 80-90% on the right and to a lesser degree on  the left.  Also noted a small left kidney with severe left renal artery  stenosis and normal size right kidney with moderate renal artery stenosis.  Following these studies, Dr. Tyrone Sage was consulted.   Dr. Tyrone Sage discussed with the patient undergoing coronary artery bypass  grafting as well as excision of the left atrial myxoma.  Dr. Tyrone Sage  discussed risks and benefits with the patient.  The patient acknowledged  understanding and agreed to proceed.  On the following evaluation of  bilateral carotid duplex ultrasound, the right showing 80-99% ICA stenosis.  Vascular surgery was consulted.  The patient was seen and evaluated by Dr.  Hart Rochester.  Dr. Hart Rochester discussed with the patient undergoing right carotid  endarterectomy.  He discussed risks and benefits.  The patient acknowledged  understanding and agreed  to proceed.  Surgery was scheduled for  March23,2007.  Prior to surgery, the patient had no complications.   The patient was taken to the operating room on March23,2007 where he  underwent coronary bypass grafting x1 with reverse saphenous vein graft to  the distal right coronary artery and excision of left atrial myxoma done  concomitantly with right carotid endarterectomy.  The patient tolerated  these procedures well and was transferred up to the intensive care unit in  stable condition.  Following surgery, the patient was seen to be  hemodynamically stable.  The patient was extubated later that evening early  morning following surgery.  Following extubation, the patient was seen to be  alert and oriented x3.  Neuro intact.  During the patient's postoperative  course, he did develop acute blood loss anemia.  His hemoglobin and  hematocrit  had dropped down to 7.9 and 23.6 on postoperative day #4.  Iron  pill was started at that time.  A hemoglobin and hematocrit were monitored  during the remainder of his hospital stay.  They did increase slightly and  prior to discharge, hemoglobin and hematocrit were 8.3 and 24.6.  The  patient will be discharged home on iron pills.  Also following surgery, the  patient was seen to have elevated blood sugars.  The patient's hemoglobin  A1c was checked and seen to be 6.7%.  The patient's blood sugars had been  controlled using Lantus insulin.  Due to this being new onset, the patient  was switched from the Lantus insulin to Glucophage p.o.  Blood sugars were  controlled.  He will be given a Glucometer for him prior to discharge home.  The patient also had respiratory insufficiency.  He continued to require  nasal cannula O2 with ambulation.  He was able to be off the oxygen at rest  sating greater than 90% but with ambulation would decrease O2 saturations  down to 80s.  The patient was encouraged to use his incentive spirometer.  He was restarted back on his Combivent inhaler.  After aggressive pulmonary  toilet, the patient was able to be weaned off oxygen sating greater than 90%  at rest as well as with ambulation.  The patient complains the shortness of  breath had improved as well.   The patient's chest tubes and lines were discontinued in the normal fashion.  He was out of bed ambulating well following surgery.  The patient's  incisions remained clear, dry and intact.  Neuro remained intact during his  postoperative course.  Vital signs were monitored and were well controlled.  Due to the patient's bilateral renal artery stenosis, it was recommended  during his immediate postoperative course not to start on ACE inhibitor.  Possibility of starting the patient on ACE inhibitor in several weeks.  The patient's weight was monitored during his postoperative course.  He was seen  to be  overloaded and started on diuretics.  Prior to discharge home, the  patient was back near his baseline.  He will be discharged home on  diuretics.  The patient was tolerating diet well.  No nausea, vomiting  noted.  Bowel movements within normal limits.  The patient remained afebrile  during his postoperative course.   The patient is discharged home [phone] on postoperative day #6.  A  follow-up appointment was scheduled with Dr. Tyrone Sage for [phone] 11:20  a.m.  The patient is to follow up with Dr. Katrinka Blazing in the next 1-2 weeks.  He  is to  call for an appointment.  The patient will obtain a PA and lateral  chest x-ray done before this appointment which he will then bring with him  to Dr. Dennie Maizes appointment.  Mr. Yapp received instructions on diet,  activity level, incisional care.  He was told no driving until released to  do so.  No heavy lifting over 10 pounds.  The patient is told he is allowed  to shower washing his incisions using soap and water.  He is to contact the  office if he develops any drainage or opening from any of his incision  sites.  The patient was told to ambulate 3-4 times per day, progress as  tolerated and to continue his breathing exercises.  The patient was educated  on diet to be low-fat, low-salt.   DISCHARGE MEDICATIONS:  1.  Aspirin 325 mg daily.  2.  Lopressor 25 mg b.i.d.  3.  Zetia 10 mg daily.  4.  Niferex 150 mg b.i.d.  5.  Lasix 40 mg daily x5 days.  6.  Potassium chloride 20 mEq daily x5 days.  7.  Folic acid 1 mg daily.  8.  Multivitamin daily.  9.  Combivent inhaler as used at home.  10. Glucophage 500 mg b.i.d.  11. Oxycodone 5 mg 1-2 tablets q.4-6h. p.r.n. pain.      Theda Belfast, [place]      Sheliah Plane, MD  Electronically Signed    KMD/MEDQ  D:  07/18/2005  T:  07/18/2005  Job:  [id number]   cc:   Sheliah Plane, MD  [address]  [place]  [place] [address]   Quita Skye. Hart Rochester, M.D.  [address]   [place]  [place] [address]   Lyn Records, M.D.  Fax: [phone]

## 2010-09-07 NOTE — Op Note (Signed)
NAME:  Calvin [name], Calvin [name] NO.:  [id number]   MEDICAL RECORD NO.:  [id number]          PATIENT TYPE:  INP   LOCATION:  2306                         FACILITY:  [facility]   PHYSICIAN:  Quita Skye. Hart Rochester, M.D.  DATE OF BIRTH:  03/18/1933   DATE OF PROCEDURE:  07/12/2005  DATE OF DISCHARGE:                                 OPERATIVE REPORT   PREOP DIAGNOSIS:  Severe coronary artery disease, atrial myxoma, and severe  right internal carotid stenosis.   POSTOP DIAGNOSIS:  Severe coronary artery disease, atrial myxoma, and severe  right internal carotid stenosis.   OPERATION:  For Dr. Hart Rochester, right carotid endarterectomy with Dacron patch  angioplasty.   SURGEON:  Dr. Hart Rochester.   FIRST ASSISTANT:  Sheliah Plane, MD   SECOND ASSISTANT:  Coral Ceo, PA.   Dr. Tyrone Sage will dictate the cardiac portion of the procedure.  Procedure  for Dr. Hart Rochester is right carotid endarterectomy with Dacron patch  angioplasty.   PROCEDURE:  The patient was taken to the operating room, placed in the  supine position at which time satisfactory general endotracheal anesthesia  was administered.  The neck, chest, abdomen and lower extremities were  prepped with Betadine scrub solution draped routine sterile manner after  appropriate monitoring lines were inserted by anesthesia.  Right neck  incision was made on the anterior border sternocleidomastoid muscle carried  down through subcutaneous tissue and platysma using Bovie.  The common  facial vein, external jugular veins ligated with 3-0 silk ties, divided  exposing the common, internal and external carotid arteries.  There was  calcified atherosclerotic plaque in the distal common carotid extending up  the internal carotid artery posteriorly with a severe stenotic lesion  proximally.  Distal vessel appeared normal with a diminished pulse.  #10  shunt was prepared.  The patient was heparinized.  The carotid vessels were  occluded with vascular  clamps.  Longitudinal opening made in the common  carotid 15 blade extended up the internal carotid with Potts scissors to a  point distal to the disease.  The plaque was at least 80-90% stenotic in  severity and heavily ulcerated.  #10 shunt was inserted without difficulty  reestablishing flow in about 2 minutes.  Standard endarterectomy was then  performed using elevator and Potts scissors with eversion endarterectomy the  external carotid.  The plaque feathered off the distal internal carotid  artery nicely not requiring any tacking sutures.  The lumen was thoroughly  irrigated heparin saline.  All loose debris carefully removed.  Arteriotomy  was closed with a patch using continuous 6-0 Prolene.  Prior to completion  of the closure the shunt was removed and after antegrade and retrograde  flushing the closure was completed reestablishing  flow initially up the external and up the internal branch.  Carotid was  occluded for less than 2 minutes for removal of the shunt.  No protamine was  given at this time.  The wound was packed during the cardiac portion of the  procedure and then following that Dr. Tyrone Sage closed the wound in routine  fashion.           ______________________________  Quita Skye Hart Rochester, M.D.     JDL/MEDQ  D:  07/12/2005  T:  07/15/2005  Job:  [id number]

## 2010-09-07 NOTE — Op Note (Signed)
NAME:  Calvin [name], KONDRACKI NO.:  [id number]   MEDICAL RECORD NO.:  [id number]          PATIENT TYPE:  INP   LOCATION:  2306                         FACILITY:  [facility]   PHYSICIAN:  Sheliah Plane, MD    DATE OF BIRTH:  1933-04-14   DATE OF PROCEDURE:  07/12/2005  DATE OF DISCHARGE:                                 OPERATIVE REPORT   PREOPERATIVE DIAGNOSES:  1.  Critical carotid artery disease.  2.  Right severe coronary occlusive disease with unstable angina.  3.  Left atrial myxoma.  4.  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   SURGEON:  Sheliah Plane, MD   FIRST ASSISTANT:  Stephanie Acre Dominick, [place]   PROCEDURE PERFORMED:  Coronary artery bypass grafting x 1 with reverse  saphenous vein graft to the distal right coronary artery and excision of  left atrial myxoma done concomitantly with right carotid endarterectomy by  Dr. Hart Rochester, dictated under separate note.   BRIEF HISTORY:  The patient is a 77 year old male who approximately 8 years  prior had undergone left pneumonectomy for carcinoma of the lung.  Since  that time, he has done relatively well but was admitted urgently through the  emergency room for new onset of right chest pain.  CT scan was performed to  rule out pulmonary embolus.  No evidence of recurrent carcinoma was noted.  However, t did have a vague filling defect in the left atrium.  This led to  echocardiogram and including a transesophageal echocardiogram which  confirmed a moderate-size left atrial myxoma.  A cardiac catheterization was  performed prior to resection of the left atrial myxoma and the patient was  found to have sequential critical right coronary artery lesions.  He also  was found to have severe carotid disease greater than 80-90% on the right  and to a lesser degree on the left.  He also has a small left shrunken  kidney with a severe left renal artery stenosis and a normal-sized right  kidney with  moderate renal artery stenosis.  Creatinine is 1.  Carotid  endarterectomy, coronary artery bypass grafting and resection of atrial  myxoma were recommended to the patient who agreed and signed informed  consent.  He was aware of increased risk because of his previous  pneumonectomy and limited respiratory reserve.   DESCRIPTION OF PROCEDURE:  With Swan-Ganz and arterial line and monitors in  place, the patient underwent general endotracheal anesthesia without  incident.  The skin of the chest and legs and neck was then prepped with  Betadine and draped in the usual sterile manner.  Transesophageal echo probe  confirmed the presence of a pedunculated left atrial mass.  Dictated under  separate note is description of the right carotid endarterectomy performed  first by Dr. Hart Rochester.   Following the carotid endarterectomy, endo vein was harvested from the right  thigh with Guidant scope.  The vein was of good quality and caliber.  Median  sternotomy was performed.  The right lung and mediastinum was shifted to the  left secondary  to the old pneumonectomy.  The pericardium was opened.  There  was shift of the mediastinal contents but this was able to be compensated  for and access to the aorta was possible.  The patient was systemically  heparinized.  Ascending aorta was cannulated.  Superior and inferior vena  caval cannulas were placed directly into the cava.  Umbilical Silastic tapes  were placed around the superior and inferior vena cava.  An aortic root vent  and cardioplegia needle was introduced into the ascending aorta.  The  patient was placed on cardiopulmonary bypass 2.4 L/min/M2.  The right  coronary artery distally was identified and opened and using a running 7-0  Prolene, the distal anastomosis was performed.  Additional cold blood  cardioplegia was administered down the vein.   The table tapes were then secured.  The right atrium was opened and through  the intraatrial septum  in the area of fossa ovalis, the left atrium was  entered.  This gave good visualization of a 1.5 cm left atrial myxoma with a  small pedunculated stalk.  Circumferential portion of atrial wall was  excised and the specimen removed and submitted to pathology.  The  intraatrial septum was then closed with a horizontal mattress 4-0 Prolene  suture.  Prior to complete closure of the left atrium, the left atrium was  allowed and left side of the heart was allowed to passively fill and de-air.  The closure was completed.  The right atrium was then closed with a  horizontal mattress 4-0 Prolene suture.  Prior to complete closure of the  right atrium, the aortic root vent cardioplegia needle was removed and a  single punch aortotomy was performed.  The vein graft to the distal right  coronary artery was then anastomosed to the ascending aorta.  Air was  evacuated from the graft and the aorta cross-clamp was removed.  The right  atrium was allowed to fill and de-air and the right atriotomy closure was  completed.  A second layer of running 4-0 Prolene was used on the atriotomy.  A McGoon needle was placed in the ascending aorta for further de-airing of  the heart.  Atrial and ventricular pacing wires were applied.  Wrap markers  were applied.  With the patient's body temperature rewarmed to 37 degrees,  he was ventilated and weaned from cardiopulmonary bypass without difficulty,  remained hemodynamically stable.  He was decannulated in the usual fashion.  Protamine sulfate was administered with operative field hemostatic.   Two atrial and two ventricular pacing wires were applied.  Graft markers  were applied.  The left pleural tube and two mediastinal tubes were left in  place.  The sternum was closed with #6 stainless steel wire.  Fascia closed  with interrupted 0 Vicryl running 3-0 Vicryl and subcutaneous tissue with 4- 0 subcuticular stitch in skin edges.  Dry dressings were applied.  Sponge   and needle count was reported as correct.  The patient tolerated the  procedure without obvious complication and was transferred to the surgical  intensive care unit for further postoperative care.      Sheliah Plane, MD  Electronically Signed     EG/MEDQ  D:  07/15/2005  T:  07/15/2005  Job:  [id number]   cc:   Lyn Records, M.D.  Fax: [phone]   C. Duane Lope, M.D.  Fax: [phone]

## 2010-09-07 NOTE — H&P (Signed)
NAME:  Calvin [name], Calvin [name] NO.:  [id number]   MEDICAL RECORD NO.:  [id number]          PATIENT TYPE:  EMS   LOCATION:  MAJO                         FACILITY:  [facility]   PHYSICIAN:  Melissa L. Ladona Ridgel, MD  DATE OF BIRTH:  13-Apr-1933   DATE OF ADMISSION:  07/04/2005  DATE OF DISCHARGE:                                HISTORY & PHYSICAL   CHIEF COMPLAINT:  Chest pain and shortness of breath, acute in onset.   PRIMARY CARE PHYSICIAN:  C. Duane Lope, M.D.   CARDIOLOGIST:  Lyn Records, M.D.   HISTORY OF PRESENT ILLNESS:  The patient is a 77 year old male who was up  and around this morning when he developed the acute onset of left-sided  chest pain with shortness of breath.  The patient relates that he has been  having chronic left-sided pain which seems to wax and wane and exacerbate  especially when he is eating.  He relates a lot of belching.  He states that  last week, he saw Dr. Tenny Craw in the office and complained of some dizziness,  nausea, left-sided pain for which he was given Phenergan.  The patient  persisted in having symptoms at home and therefore he came to the emergency  room.  At this time he had a significant association with shortness of  breath.   REVIEW OF SYSTEMS:  He states that he has been having on and off headaches  and he has had this left-sided chest discomfort right about where his  incision was for his pneumonectomy.  He has been dizzy on and off.  He  denied fever or chills.  He denied vomiting, but did have nausea.  His wife  relates this week that his blood pressure has been up and down.  His heart  rate also has been up and down.  The patient relates no melena, no  hematochezia, no diarrhea, no constipation.  All other review of systems  appear to be negative, except for the patient is hard of hearing.   PAST MEDICAL HISTORY:  The patient has had cancer of the left lung, question  of whether this was non small cell.  Records are not  available at this time.  He had pneumonectomy and no chemotherapy.  He has hypertension.  He denies  diabetes, but he does have high triglycerides.   PAST SURGICAL HISTORY:  7 years ago, he had a pneumonectomy.  Other than  that he has had no surgical procedures.   SOCIAL HISTORY:  He smokes for at least 40 years, but had quit around the  time of his surgery.  He denies ethanol.  He was a Naval architect by trade.   FAMILY HISTORY:  His sister has problems with a high heart rate.  Mother is  deceased with an MI, father is deceased with stomach cancer.   ALLERGIES:  AMOXICILLIN, BEE VENOM.   MEDICATIONS:  1.  Aspirin 81 mg daily.  2.  Multivitamin one tablet daily.  3.  Zetia 10 mg once daily.  4.  Combivent two puffs.  5.  Benicar 40/12.5 mg once daily.   PHYSICAL EXAMINATION:  VITAL SIGNS:  Temperature 99 at max, his current is  98.5, blood pressure 123/72, pulse 89, respirations 22, saturation 94%.  GENERAL:  The patient is in no acute distress.  He is normocephalic and  atraumatic.  Pupils equal, round, and reactive to light.  Extraocular  muscles are intact.  Mucous membranes are moist.  NECK:  Supple.  There is no JVD, but he does have a prominent stroke and a J  wave.  He is very hard of hearing.  He has dry mucous membranes.  He is  mostly edentulous with the presence of frontal lower teeth.  CHEST:  No breath sounds in the left field.  He has positive breath sounds  in the right field without rhonchi, rales, or wheezes.  CARDIOVASCULAR:  Regular rate and rhythm.  Positive S1 and S2.  No S3 or S4.  He has distant heart sounds with a 2/6 systolic ejection murmur heard at the  left sternal border and at the right second intercostal space.  ABDOMEN:  Soft, nontender, and nondistended with positive bowel sounds.  EXTREMITIES:  No cyanosis, clubbing, or edema.  He has a corn on his left  foot.  NEUROLOGY:  He has decreased hearing.  Cranial nerves II-XII grossly intact.  Power  is 5/5.  DTR's are 2+.   LABORATORY DATA:  White count 10.7, hemoglobin 14.6, hematocrit 40.2,  platelets 271, sodium 138, potassium 4.0, chloride 99, CO2 30, BUN 13,  creatinine 1.0, glucose 116, calcium 9.6.  Point of care enzymes is negative  x1. Fibrin derivatives are elevated at 1.7.   EKG shows normal sinus rhythm at 80 beats per minute with no significant ST  T wave changes.   Chest x-ray shows pneumonectomy on the left, no post surgical changes are  identified on the x-ray.   ASSESSMENT:  This is a 77 year old white male with left-sided chest pain  with associated shortness of breath, nausea, with burping which came on very  acutely this morning.  The patient is noted to have an elevated D-dimer, but  otherwise has a negative cardiac workup.   Problem 1.  Cardiovascular.  Left-sided chest pain possibly related to his  incisional scar.  However, pertinent studies that need to be elucidated  would be a chest CT to rule out PE since his D-dimer is elevated.  The  patient also should be ruled out on serial cardiac markers.  We will start  him on Lovenox 1 mg/kg, continue his aspirin, Benicar, and consider starting  Lopressor.   Problem 2.  Pulmonary.  He has a history of a pneumonectomy on the left  secondary to lung cancer, the type of which is unknown at this time.  We  will attempt to obtain his old records.  We will obtain a chest CT to rule  out PE.   Problem 3.  GI dyspepsia with frequent eructations.  The patient may need to  have a CT of the abdomen to evaluate possible sources for gastric upset as  well as pain.  Plan is to go ahead and do a chest, abdomen, and pelvis CT if  the patient is agreeable.   Problem 4.  GU.  He has a negative UA.  We will follow up his I&O's.   Problem 5.  Endocrine.  We will check a TSH.   Problem 6.  DVT prophylaxis will be with Lovenox.      Melissa L. Ladona Ridgel,  MD  Electronically Signed    MLT/MEDQ  D:  07/04/2005  T:   07/04/2005  Job:  [id number]

## 2010-09-07 NOTE — Op Note (Signed)
NAME:  Calvin [name], Calvin [name] NO.:  [id number]   MEDICAL RECORD NO.:  [id number]          PATIENT TYPE:  AMB   LOCATION:  SDS                          FACILITY:  [facility]   PHYSICIAN:  Corky Crafts, MDDATE OF BIRTH:  May 15, 1932   DATE OF PROCEDURE:  12/30/2005  DATE OF DISCHARGE:  12/30/2005                                 OPERATIVE REPORT   PROCEDURES PERFORMED:  Abdominal aortogram, bilateral selective renal  angiogram.   OPERATOR:  Corky Crafts, MD   INDICATIONS:  Refractory hypertension and renal artery stenosis by MR  angiogram.   PROCEDURE NARRATIVE:  The risks, benefits of renal angiography were  explained to the patient and informed consent was obtained.  He was brought  to the [facility] lab, placed on the table. He was prepped and draped in the usual  sterile fashion.  His right groin was infiltrated with 1% lidocaine.  A 5-  French arterial sheath was placed into his right groin using modified  Seldinger technique.  A pigtail catheter was advanced to the abdominal  aorta.  A power injection of contrast was done in the AP projection to  visualize the renal arteries as well as the infrarenal abdominal aorta.  A 5-  [place] JR-4.0 catheter was then advanced to the ostium of the right renal  artery and placed under fluoroscopic guidance.  Digital angiography was  performed using injection assist device.  The left renal artery was then  selectively engaged with the same JR-4.0 catheter.  Digitally angiography  was performed using the assist device.  The sheath will be removed using  manual compression.   IMPRESSIONS:  1. Mild bilateral renal artery stenosis.  There is mild plaquing of the      right renal artery.  The single renal artery supplies a normal size      right kidney.  The left renal artery is small and has a proximal 40%      stenosis.  There was no pressure gradient across this stenosis.  The      left kidney is atrophic.   RECOMMENDATIONS:  1. The patient should continue with his secondary prevention for      atherosclerotic vascular disease including aspirin.  Should also      continue with aggressive antihypertensive medications.  2. The patient will have 4 hours of bedrest and assuming there is no      bleeding will be allowed to go home this evening.  3. The patient will follow-up with Dr. Amil Amen and Dr. Tenny Craw.   INDICATIONS:  Thanks      Corky Crafts, MD  Electronically Signed    JSV/MEDQ  D:  12/30/2005  T:  12/31/2005  Job:  [id number]   cc:   C. Duane Lope, M.D.

## 2010-09-07 NOTE — H&P (Signed)
NAME:  Calvin [name], Calvin [name] NO.:  [id number]   MEDICAL RECORD NO.:  [id number]          PATIENT TYPE:  INP   LOCATION:  3742                         FACILITY:  [facility]   PHYSICIAN:  Lyn Records, M.D.   DATE OF BIRTH:  05-11-1932   DATE OF ADMISSION:  08/16/2005  DATE OF DISCHARGE:                                HISTORY & PHYSICAL   INDICATION FOR ADMISSION:  Atrial flutter with rapid ventricular response.   PRIMARY CARDIOLOGIST:  Dr. Garnette Scheuermann.   HISTORY OF PRESENT ILLNESS:  The patient is a very pleasant 77 year old  white male with past medical history of lung cancer (status post left  pneumonectomy in 2000), history of left atrial myxoma (status post excision,  [id number]), single-vessel coronary artery disease, diabetes mellitus,  peripheral vascular disease (status post right carotid endarterectomy), past  history of goiter, presents for evaluation of heart rate racing started  approximately 8:30 tonight.  The patient reports recent hospitalization in  March for evaluation of left atrial mass.  At complete workup there  including TEE, cath, followed by single-vessel CABG, right carotid  endarterectomy and left atrial myxoma excision.  The patient initially did  well and after discharge, over the last week to 10 days, he has been having  aching on the right side of his chest typically relieved by applying  pressure with a pillow or taking oxycodone.  He states, I feel better when  I will overall on that side.  Tonight, while watching TV, he started to  feel woozy, noticed his heart racing and he went to check his blood  pressure and his heart rate was noted to be in the 150-160 range.  He then  came to the emergency room for further evaluation.  On arrival, the patient  was found to be in atrial flutter with rapid ventricular response with heart  rate in the 150-160 range.  He was given Cardizem bolus 25mg  and started on  Cardizem drip  5 mg/hr his blood pressure  fell 180/110 to 105/60.  He is  currently hemodynamically stable with heart rate varying from 130-150 and  blood pressure in the 105-120 range.  The patient is stable and asymptomatic  at this point.   CURRENT MEDICATIONS:  1.  Inferex 50 mg b.i.d.  2.  Metoprolol 25 mg daily.  3.  Glucophage 1 g b.i.d.  4.  Multivitamin once daily.  5.  Zetia 10 mg daily.  6.  Aspirin once daily.  7.  Combivent once daily.   ALLERGIES:  PENICILLIN.   FAMILY HISTORY:  Positive for heart disease.   SOCIAL HISTORY:  He is married.  He has a history of tobacco, quit in 2000  and no significant alcohol intake.   EXAM:  VITAL SIGNS:  He is afebrile.  Pulse rate is 130-150, blood pressure  is 105/60.  He is saturating 96% on room air.  GENERAL:  He is a very pleasant white male, alert and oriented x4, in no  acute distress.  He does have mild facial droop on the right.  NECK:  His neck shows a right carotid endarterectomy incision which is  healing, no significant bruit noted.  LUNGS:  His right lung field is clear.  Status post left pneumonectomy.  HEART:  Irregular regular with soft 1-2/6 murmur noted.  ABDOMEN:  Obese, soft, nontender and nondistended.  No rebound or guarding.  EXTREMITIES: Warm with trace pedal edema.   LABORATORY AND ACCESSORY CLINICAL DATA:  His ECG shows atrial flutter at  rate of 147 beats per minute with nonspecific ST-T wave changes.   His blood work shows white count of 12.6, hemoglobin 11.9, platelet count  412,000; BUN and creatinine of 13 and 1.0, potassium of 4.0, glucose of 121.  Myoglobin was 92.  MB was 1.7.  Troponin I is less than 0.05.   IMPRESSION:  1.  New-onset atrial flutter with rapid ventricular response starting 8:30      p.m. on August 15, 2005, now with duration of approximately 5 hours.      Patient with decrease in blood pressure on starting Cardizem drip,      limiting potential antiarrhythmic because of history of coronary disease      as well as  blood pressure changes.  2.  Recent left atrial myxoma incision and single-vessel coronary artery      bypass graft.  3.  History of lung cancer.  4.  History of goiter.   PLAN:  1.  From A flutter standpoint, the patient will be admitted to a telemetry      bed to Dr. Garnette Scheuermann with [facility].  We will continue IV      Cardizem and start an amiodarone drip.  Should he continue to have      difficulty with rate control, likely will benefit from elective DC      cardioversion in the morning.  Will check a TSH, serial cardiac enzymes      and will start Lovenox 1 mg/kg subcu b.i.d. as well as an aspirin.  I      will continue his prior medications.  Further measures per Dr. Garnette Scheuermann.      Calvin [name]., M.D.  Electronically Signed      Lyn Records, M.D.  Electronically Signed    TWK/MEDQ  D:  08/16/2005  T:  08/16/2005  Job:  [id number]

## 2010-09-07 NOTE — Discharge Summary (Signed)
NAME:  Calvin [name], HIPPE NO.:  [id number]   MEDICAL RECORD NO.:  [id number]          PATIENT TYPE:  INP   LOCATION:  3742                         FACILITY:  [facility]   PHYSICIAN:  Lyn Records, M.D.   DATE OF BIRTH:  08-30-1932   DATE OF ADMISSION:  08/15/2005  DATE OF DISCHARGE:  08/19/2005                                 DISCHARGE SUMMARY   DISCHARGE DIAGNOSES:  1.  Atrial flutter with rapid ventricular response spontaneously converted      to normal sinus rhythm.  2.  Atrial myxoma, status post excision, March 2007.  3.  Single-vessel coronary artery disease.  4.  Coronary artery bypass graft times one with reverse saphenous vein graft      to the distal right coronary artery, March 2007.  5.  History of lung carcinoma, status post left pneumonectomy.  6.  History of coronary artery disease.  7.  Diabetes.  8.  Hyperlipidemia times three years.  9.  Right carotid stenosis, status post right carotid endarterectomy.   HOSPITAL COURSE:  [name] is a 77 year old male patient who in March 2007  underwent atrial myxoma excision as well as single-vessel bypass as  mentioned above. There is known right carotid stenosis and underwent a right  carotid endarterectomy as well. He began having tachypalpitations in April  2007 and felt woozy and presents to the emergency room where he was found  to be in atrial flutter with a rapid ventricular response. Ultimately he  underwent cardioversion on August 16, 2005, with successful  conversion with  one transthoracic biphasic synchronized direct current energy at 50 joules.  He was continued on IV amiodarone, IV Cardizem, and over the next several  days was switched over to oral medications. By August 19, 2005, he was felt  to be ready for discharge home.   His lab studies during this day included:  Hemoglobin 11.7, hematocrit 35.7,  platelet count 428,000. Normal hepatic function studies. Potassium 3.7, BUN  6, creatinine 0.9.  TSH 1.057.   Chest x-ray shows findings compatible with a prior pneumonectomy. He has a  small right pleural effusion. On discharge his EKG shows a normal sinus  rhythm with a ventricular rate of 85 beats per minute, nonspecific lateral  ST-T wave changes, otherwise normal.   DISCHARGE MEDICATIONS:  1.  Amiodarone 200 mg two tablets b.i.d.  2.  Cardizem 180 mg daily.  3.  Inferex 150 mg b.i.d.  4.  Metoprolol 50 mg b.i.d.  5.  Zetia 10 mg daily.  6.  Enteric-coated aspirin 325 mg daily.  7.  Glucophage 1000 mg b.i.d.  8.  Combivent inhaler p.r.n.   The patient is to call for chronic symptoms, no change in activity or wound  care from bypass/myxoma resection. He is to be on a low-fat, diabetic diet.  He has seen Dr. Amil Amen during this hospital stay and will follow up with  him in two weeks on Sep 03, 2005, at 10:20 a.m.      Guy Franco, P.A.      Lyn Records,  M.D.  Electronically Signed    LB/MEDQ  D:  08/19/2005  T:  08/19/2005  Job:  [id number]

## 2010-09-07 NOTE — Cardiovascular Report (Signed)
NAME:  Calvin [name], Calvin [name] NO.:  [id number]   MEDICAL RECORD NO.:  [id number]          PATIENT TYPE:  INP   LOCATION:  3729                         FACILITY:  [facility]   PHYSICIAN:  Francisca December, M.D.  DATE OF BIRTH:  26-Apr-1932   DATE OF PROCEDURE:  07/08/2005  DATE OF DISCHARGE:                              CARDIAC CATHETERIZATION   PROCEDURES PERFORMED:  1.  Left heart catheterization.  2.  Coronary angiography.  3.  Left ventriculogram.   INDICATIONS:  [name] is a 77 year old man who was admitted four days  ago with acute chest pain. Myocardial infarction was ruled out. A CT scan of  the chest for PE protocol identified a filling defect in the left atrium.  Myocardial perfusion scintigraphy was performed and was normal. A TEE was  performed and identified a left atrial myxoma. He is brought to  catheterization laboratory at this time to identify the extent of coronary  disease and to provide for preoperative planning and risk assessment.   PROCEDURE NOTE:  The patient is brought to the cardiac catheterization  laboratory in the fasting state. The right groin was prepped and draped in  the usual sterile fashion. Local anesthesia was obtained with infiltration  of 1% lidocaine. A 6-French catheter sheath was inserted percutaneously in  the right femoral artery utilizing an anterior approach over a guiding J-  wire. A 110 cm pigtail catheter was used to measure pressures in the  ascending aorta and in the left ventricle both prior to and following the  ventriculogram. A 30 degree RAO cine left ventriculogram was performed  utilizing a power injector. 36 cc were injected at 12 cc per second. The  pigtail catheter was then removed and exchanged for a 6-French #4 left  Judkins catheter.   Cineangiography of the left coronary artery was conducted in the LAO and RAO  projections. The left Judkins catheter was exchanged for a 6-French #4 right  Judkins  catheter. Cineangiography of the right coronary artery was conducted  in LAO and RAO projections. At the end of the procedure, catheter and  catheter sheaths were removed. Hemostasis was achieved by direct pressure.  The patient was transported to the recovery area in stable condition with an  intact distal pulse.   HEMODYNAMIC RESULTS:  Systemic arterial pressure was 121/76 with a mean of  97 mmHg. There was no systolic gradient across the aortic valve. The left  ventricular end-diastolic pressure was 12 mmHg pre ventriculogram.   ANGIOGRAPHY:  The left ventriculogram demonstrated normal chamber size and  normal global systolic function without regional wall motion abnormality. A  visual estimate of the ejection fraction is 65%. There was no mitral  regurgitation. There was left coronary artery calcification seen. The aortic  valve was trileaflet and opened normally during systole.   There was a right dominant coronary system present. The main left coronary  artery was normal.   Left anterior descending artery and its branches were normal. No significant  obstructions were seen within the main trunk of the anterior descending  artery  and it is transapical. A single small diagonal branch arises and is  without significant obstruction.   The left circumflex coronary and its branches are minimally diseased; the  vessel gives rise to a very small first marginal branch. The second marginal  branch is large and perfuses the inferolateral mid to apical portion of the  LV. There is a 30% stenosis in the left circumflex just prior to the origin  of this vessel. The ongoing circumflex then gives rise to a single small  posterolateral branch.   The right coronary artery and its branches are highly diseased; the vessel  contains a tubular 80% proximal narrowing and then in the mid-portion just  before the bifurcation of a right ventricular branch there was a subtotal 95-  99% stenosis. This  was rather focal in nature. The ongoing right coronary  was relatively small. It gives rise to a small posterior descending artery  and small posterolateral segment and branch. There were no obstructions in  the distal portion of the right coronary. Collateral vessels are not seen.   FINAL IMPRESSION:  1.  Atherosclerotic cardiovascular disease, single vessel.  2.  Intact ventricular size and systolic function.  3.  Previous noninvasive study indicative of the presence of a left atrial      myxoma.   PLAN/RECOMMENDATIONS:  Dr. Sheliah Plane to review the cineangiograms and  consider operative risk. If the decision is made not to proceed with  surgical intervention then percutaneous revascularization of the right  coronary could be undertaken.      Francisca December, M.D.  Electronically Signed     JHE/MEDQ  D:  07/08/2005  T:  07/09/2005  Job:  [id number]   cc:   Sheliah Plane, MD  [address]  [place]  [place] [address]   C. Duane Lope, M.D.  Fax: [phone]

## 2010-10-18 ENCOUNTER — Other Ambulatory Visit: Payer: Self-pay

## 2010-10-26 ENCOUNTER — Other Ambulatory Visit (INDEPENDENT_AMBULATORY_CARE_PROVIDER_SITE_OTHER): Payer: Medicare Other

## 2010-10-26 DIAGNOSIS — I6529 Occlusion and stenosis of unspecified carotid artery: Secondary | ICD-10-CM

## 2010-10-26 DIAGNOSIS — Z48812 Encounter for surgical aftercare following surgery on the circulatory system: Secondary | ICD-10-CM

## 2010-10-31 ENCOUNTER — Other Ambulatory Visit: Payer: Self-pay | Admitting: Cardiothoracic Surgery

## 2010-10-31 DIAGNOSIS — I2510 Atherosclerotic heart disease of native coronary artery without angina pectoris: Secondary | ICD-10-CM

## 2010-11-01 ENCOUNTER — Ambulatory Visit (INDEPENDENT_AMBULATORY_CARE_PROVIDER_SITE_OTHER): Payer: Medicare Other | Admitting: Cardiothoracic Surgery

## 2010-11-01 ENCOUNTER — Ambulatory Visit
Admission: RE | Admit: 2010-11-01 | Discharge: 2010-11-01 | Disposition: A | Discharge status: Home / Self Care | Payer: Medicare Other | Source: Ambulatory Visit | Attending: Cardiothoracic Surgery | Admitting: Cardiothoracic Surgery

## 2010-11-01 DIAGNOSIS — I2510 Atherosclerotic heart disease of native coronary artery without angina pectoris: Secondary | ICD-10-CM

## 2010-11-01 DIAGNOSIS — C3490 Malignant neoplasm of unspecified part of unspecified bronchus or lung: Secondary | ICD-10-CM

## 2010-11-01 DIAGNOSIS — D492 Neoplasm of unspecified behavior of bone, soft tissue, and skin: Secondary | ICD-10-CM

## 2010-11-01 NOTE — Assessment & Plan Note (Signed)
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  Subsequently, the patient had a right carotid endarterectomy and atrial myxoma resected in 2007.  He continues to do well though he over the years has had noted increasing shortness of breath with exertion.  He still remains functional, doing yard work using a Engineering geologist.  He notes that his shortness of breath is more severe when he bends over.  His room air saturation is 88% but in discussion today, he is reluctant at this point start using oxygen.  PHYSICAL EXAMINATION:  VITAL SIGNS:  His blood pressure is 131/75, pulse is 68, respiratory rate is 18, and on room air his O2 saturation is 88%. CHEST:  Sternum is stable and well healed.  He has very slight expiratory wheeze over the right lung. LYMPHATIC:  I do not appreciate any cervical, supraclavicular, or axillary adenopathy. ABDOMEN:  Benign. EXTREMITIES:  He has no pedal edema.  Followup chest x-ray is unchanged without evidence of recurrent tumor in the right lung.  The patient did have a follow up echocardiogram done 1 year ago without evidence of recurrent myxoma and normal LV size and function.  A CT scan was done on admission for shortness of breath in September 2011 that showed no evidence of pulmonary emboli or recurrent tumor.  Overall, the patient continues to do well following pneumonectomy and atrial myxoma excision though he is starting have increasing symptoms of dyspnea on exertion.  I have discussed with him the testing for his O2 saturation with exercise, referral to a pulmonologist.  At this point, he does not wish to do either of these and will further discuss it with Dr. Tenny Craw on his  next visit.  I do plan to see him back in 6 months.  Sheliah Plane, MD Electronically Signed  EG/MEDQ  D:  11/01/2010  T:  11/01/2010  Job:  [id number]  cc:   Duane Lope, MD Corky Crafts, MD

## 2010-11-07 NOTE — Procedures (Unsigned)
CAROTID DUPLEX EXAM  INDICATION:  Followup carotid artery disease.  HISTORY: Diabetes:  Yes. Cardiac:  CABG. Hypertension:  Yes. Smoking:  Previously. Previous Surgery:  Right carotid endarterectomy 07/12/2005. CV History:  Currently asymptomatic. Amaurosis Fugax No, Paresthesias No, Hemiparesis No                                      RIGHT             LEFT Brachial systolic pressure:         133               130 Brachial Doppler waveforms:         Normal            Normal Vertebral direction of flow:        Antegrade         Antegrade DUPLEX VELOCITIES (cm/sec) CCA peak systolic                   109               89 ECA peak systolic                   94                211 ICA peak systolic                   53                209 ICA end diastolic                   17                61 PLAQUE MORPHOLOGY:                  Mixed             Calcific PLAQUE AMOUNT:                      Mild              Moderate PLAQUE LOCATION:                    CCA               ICA, CCA, ECA  IMPRESSION: 1. Patent right carotid endarterectomy site with no evidence of     restenosis of the internal carotid artery. 2. Left internal carotid artery velocity suggests 40%-59% stenosis     (high end of range). 3. Antegrade vertebral arteries bilaterally.  ___________________________________________ Quita Skye Hart [place], M.D.  EM/MEDQ  D:  10/26/2010  T:  10/26/2010  Job:  [id number]

## 2010-12-27 ENCOUNTER — Other Ambulatory Visit: Payer: Self-pay | Admitting: Diagnostic Neuroimaging

## 2011-01-03 ENCOUNTER — Ambulatory Visit
Admission: RE | Admit: 2011-01-03 | Discharge: 2011-01-03 | Disposition: A | Discharge status: Home / Self Care | Payer: Medicare Other | Source: Ambulatory Visit | Attending: Diagnostic Neuroimaging | Admitting: Diagnostic Neuroimaging

## 2011-01-03 MED ORDER — GADOBENATE DIMEGLUMINE 529 MG/ML IV SOLN
20.0000 mL | Freq: Once | INTRAVENOUS | Status: AC | PRN
  Administered 2011-01-03: 20 mL via INTRAVENOUS

## 2011-01-11 ENCOUNTER — Other Ambulatory Visit: Payer: Medicare Other

## 2011-01-11 LAB — DIFFERENTIAL
Basophils Absolute: 0
Basophils Relative: 0
Eosinophils Absolute: 0.1
Neutrophils Relative %: 79 — ABNORMAL HIGH

## 2011-01-11 LAB — POCT CARDIAC MARKERS: Troponin i, poc: <0.05

## 2011-01-11 LAB — CBC
MCHC: 33.5
MCV: 92.6
Platelets: 201
WBC: 10.2

## 2011-01-11 LAB — URINALYSIS, ROUTINE W REFLEX MICROSCOPIC
Hgb urine dipstick: NEGATIVE
Protein, ur: NEGATIVE
Urobilinogen, UA: 0.2

## 2011-01-11 LAB — I-STAT 8, (EC8 V) (CONVERTED LAB)
Acid-Base Excess: 6 — ABNORMAL HIGH
TCO2: 34
pCO2, Ven: 51.7 — ABNORMAL HIGH
pH, Ven: 7.399 — ABNORMAL HIGH

## 2011-01-11 LAB — POCT I-STAT CREATININE
Creatinine, Ser: 1.3
Operator id: 282201

## 2011-05-08 ENCOUNTER — Other Ambulatory Visit: Payer: Self-pay | Admitting: Cardiothoracic Surgery

## 2011-05-08 DIAGNOSIS — C3490 Malignant neoplasm of unspecified part of unspecified bronchus or lung: Secondary | ICD-10-CM

## 2011-05-09 ENCOUNTER — Ambulatory Visit (INDEPENDENT_AMBULATORY_CARE_PROVIDER_SITE_OTHER): Payer: Medicare Other | Admitting: Cardiothoracic Surgery

## 2011-05-09 ENCOUNTER — Ambulatory Visit
Admission: RE | Admit: 2011-05-09 | Discharge: 2011-05-09 | Disposition: A | Discharge status: Home / Self Care | Payer: Medicare Other | Source: Ambulatory Visit | Attending: Cardiothoracic Surgery | Admitting: Cardiothoracic Surgery

## 2011-05-09 ENCOUNTER — Encounter: Payer: Self-pay | Admitting: Cardiothoracic Surgery

## 2011-05-09 VITALS — BP 111/60 | HR 65 | Resp 20 | Ht 71.0 in | Wt 210.0 lb

## 2011-05-09 DIAGNOSIS — C3490 Malignant neoplasm of unspecified part of unspecified bronchus or lung: Secondary | ICD-10-CM

## 2011-05-09 DIAGNOSIS — E781 Pure hyperglyceridemia: Secondary | ICD-10-CM

## 2011-05-09 DIAGNOSIS — I10 Essential (primary) hypertension: Secondary | ICD-10-CM

## 2011-05-09 DIAGNOSIS — Z85118 Personal history of other malignant neoplasm of bronchus and lung: Secondary | ICD-10-CM

## 2011-05-09 DIAGNOSIS — D151 Benign neoplasm of heart: Secondary | ICD-10-CM

## 2011-05-09 DIAGNOSIS — I779 Disorder of arteries and arterioles, unspecified: Secondary | ICD-10-CM

## 2011-05-09 DIAGNOSIS — I2510 Atherosclerotic heart disease of native coronary artery without angina pectoris: Secondary | ICD-10-CM | POA: Insufficient documentation

## 2011-05-09 DIAGNOSIS — H9190 Unspecified hearing loss, unspecified ear: Secondary | ICD-10-CM

## 2011-05-09 NOTE — Progress Notes (Signed)
[address]            [place] [address]          [phone]      Calvin [name] [facility] Medical Record #[id number] Date of Birth: 08-19-1932  Referring: Corky Crafts., * Primary Care: Daisy Floro, MD, MD  Chief Complaint:   Follow up from Lung CA and myxoma  History of Present Illness:     Patient is a 78 year old who 13 years ago underwent left pneumonectomy for a squamous cell carcinoma. 6 years ago he underwent coronary bypass grafting and resection of atrial myxoma. He has known underlying pulmonary disease is currently somewhat limited by shortness of breath. He does use oxygen at night. He has been recently taking care of his wife who had knee surgery complicated by pulmonary emboli. He returns today for followup visit and chest x-ray after previous pneumonectomy.    Past Medical History  Diagnosis Date  . HTN (hypertension)   . History of lung cancer   . Hypertriglyceridemia   . CAD (coronary artery disease)   . Atrial myxoma     Left  . Carotid artery disease   . Hearing loss      History  Smoking status  . Former Smoker  . Types: Cigarettes  . Quit date: 07/21/1997  Smokeless tobacco  . Never Used    History  Alcohol Use No     Allergies  Allergen Reactions  . Statins Other (See Comments)    Muscle aches  . Amoxicillin Rash    Current Outpatient Prescriptions  Medication Sig Dispense Refill  . acetaminophen (TYLENOL) 325 MG tablet Take 650 mg by mouth every 6 (six) hours as needed.      Marland Kitchen albuterol-ipratropium (COMBIVENT) 18-103 MCG/ACT inhaler Inhale 2 puffs into the lungs every 6 (six) hours as needed.      Marland Kitchen aspirin 325 MG EC tablet Take 325 mg by mouth daily.      . cetirizine (ZYRTEC) 10 MG tablet Take 10 mg by mouth daily. PRN      . felodipine (PLENDIL) 10 MG 24 hr tablet Take 10 mg by mouth daily.      . fish oil-omega-3 fatty acids 1000 MG capsule Take 1,200 g by mouth daily.      Marland Kitchen  losartan-hydrochlorothiazide (HYZAAR) 100-25 MG per tablet Take 1 tablet by mouth daily.      . metFORMIN (GLUCOPHAGE) 500 MG tablet Take 500 mg by mouth daily with breakfast.      . nebivolol (BYSTOLIC) 10 MG tablet Take 10 mg by mouth daily.      . ranitidine (ZANTAC) 150 MG tablet Take 150 mg by mouth 2 (two) times daily. PRN           Physical Exam: BP 111/60  Pulse 65  Resp 20  Ht 5\' 11"  (1.803 m)  Wt 210 lb (95.255 kg)  BMI 29.29 kg/m2  SpO2 95%  General appearance: alert, cooperative and no distress Neurologic: intact Heart: regular rate and rhythm, S1, S2 normal, no murmur, click, rub or gallop Lungs: diminished breath sounds left chest Abdomen: soft, non-tender; bowel sounds normal; no masses,  no organomegaly Extremities: extremities normal, atraumatic, no cyanosis or edema, no edema, redness or tenderness in the calves or thighs and no ulcers, gangrene or trophic changes Patient has no cervical supraclavicular adenopathy  Diagnostic Studies &  Laboratory data:     Recent Radiology Findings:   Dg Chest 2 View  05/09/2011  *RADIOLOGY REPORT*  Clinical Data: History of left lung cancer 13 years ago  CHEST - 2 VIEW  Comparison: November 01, 2010  Findings: Opacification, volume loss, and pleural calcification, consistent with prior pneumonectomy, are again noted on the left and unchanged.  The right lung remains clear.  Degenerative changes appear stable within the mid thoracic spine.  IMPRESSION: Stable chest x-ray with evidence of prior left pneumonectomy.  Original Report Authenticated By: Brandon Melnick, M.D.      Recent Lab Findings: Lab Results  Component Value Date   WBC 8.3 01/22/2010   HGB 13.2 01/22/2010   HCT 39.7 01/22/2010   PLT 181 01/22/2010   GLUCOSE 105* 01/20/2010   CHOL  Value: 217        ATP III CLASSIFICATION:  <200     mg/dL   Desirable  409-811  mg/dL   Borderline High  >=914    mg/dL   High       * 7/82/9562   TRIG 215* 01/19/2010   HDL 43 01/19/2010    LDLCALC  Value: 131        Total Cholesterol/HDL:CHD Risk Coronary Heart Disease Risk Table                     Men   Women  1/2 Average Risk   3.4   3.3  Average Risk       5.0   4.4  2 X Average Risk   9.6   7.1  3 X Average Risk  23.4   11.0        Use the calculated Patient Ratio above and the CHD Risk Table to determine the patient's CHD Risk.        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 Head atraumatic soma resected in 2007 His functional status with home oxygen at night appear stable Recent MRI scan of the head showed no evidence of metastatic disease, the scan was obtained because of headaches. Plan see the patient back in one year   Delight Ovens MD  Beeper [phone] Office [phone] 05/09/2011 11:40 AM

## 2011-05-17 ENCOUNTER — Inpatient Hospital Stay (HOSPITAL_COMMUNITY)
Admission: EM | Admit: 2011-05-17 | Discharge: 2011-05-19 | DRG: 069 | Disposition: A | Discharge status: Home / Self Care | Payer: Medicare Other | Attending: Internal Medicine | Admitting: Internal Medicine

## 2011-05-17 ENCOUNTER — Other Ambulatory Visit: Payer: Self-pay

## 2011-05-17 ENCOUNTER — Emergency Department (HOSPITAL_COMMUNITY): Payer: Medicare Other

## 2011-05-17 ENCOUNTER — Encounter (HOSPITAL_COMMUNITY): Payer: Self-pay | Admitting: *Deleted

## 2011-05-17 DIAGNOSIS — I6529 Occlusion and stenosis of unspecified carotid artery: Secondary | ICD-10-CM | POA: Diagnosis present

## 2011-05-17 DIAGNOSIS — Z79899 Other long term (current) drug therapy: Secondary | ICD-10-CM

## 2011-05-17 DIAGNOSIS — E871 Hypo-osmolality and hyponatremia: Secondary | ICD-10-CM | POA: Diagnosis present

## 2011-05-17 DIAGNOSIS — E119 Type 2 diabetes mellitus without complications: Secondary | ICD-10-CM | POA: Diagnosis present

## 2011-05-17 DIAGNOSIS — I10 Essential (primary) hypertension: Secondary | ICD-10-CM | POA: Diagnosis present

## 2011-05-17 DIAGNOSIS — Z85118 Personal history of other malignant neoplasm of bronchus and lung: Secondary | ICD-10-CM

## 2011-05-17 DIAGNOSIS — Z87891 Personal history of nicotine dependence: Secondary | ICD-10-CM

## 2011-05-17 DIAGNOSIS — H9190 Unspecified hearing loss, unspecified ear: Secondary | ICD-10-CM | POA: Diagnosis present

## 2011-05-17 DIAGNOSIS — Z7982 Long term (current) use of aspirin: Secondary | ICD-10-CM

## 2011-05-17 DIAGNOSIS — R200 Anesthesia of skin: Secondary | ICD-10-CM | POA: Diagnosis present

## 2011-05-17 DIAGNOSIS — R202 Paresthesia of skin: Secondary | ICD-10-CM | POA: Diagnosis present

## 2011-05-17 DIAGNOSIS — E785 Hyperlipidemia, unspecified: Secondary | ICD-10-CM | POA: Diagnosis present

## 2011-05-17 DIAGNOSIS — I2510 Atherosclerotic heart disease of native coronary artery without angina pectoris: Secondary | ICD-10-CM | POA: Diagnosis present

## 2011-05-17 DIAGNOSIS — G459 Transient cerebral ischemic attack, unspecified: Principal | ICD-10-CM | POA: Diagnosis present

## 2011-05-17 DIAGNOSIS — E669 Obesity, unspecified: Secondary | ICD-10-CM | POA: Diagnosis present

## 2011-05-17 DIAGNOSIS — Z951 Presence of aortocoronary bypass graft: Secondary | ICD-10-CM

## 2011-05-17 LAB — CREATININE, SERUM: Creatinine, Ser: 0.88 mg/dL (ref 0.50–1.35)

## 2011-05-17 LAB — COMPREHENSIVE METABOLIC PANEL
AST: 17 U/L (ref 0–37)
Alkaline Phosphatase: 70 U/L (ref 39–117)
BUN: 17 mg/dL (ref 6–23)
CO2: 32 mEq/L (ref 19–32)
Chloride: 94 mEq/L — ABNORMAL LOW (ref 96–112)
Creatinine, Ser: 0.99 mg/dL (ref 0.50–1.35)
GFR calc non Af Amer: 78 mL/min — ABNORMAL LOW (ref >90)
Potassium: 3.8 mEq/L (ref 3.5–5.1)
Total Bilirubin: 0.4 mg/dL (ref 0.3–1.2)

## 2011-05-17 LAB — DIFFERENTIAL
Basophils Absolute: 0 10*3/uL (ref 0.0–0.1)
Lymphocytes Relative: 19 % (ref 12–46)
Monocytes Absolute: 0.8 10*3/uL (ref 0.1–1.0)
Monocytes Relative: 8 % (ref 3–12)
Neutro Abs: 6.7 10*3/uL (ref 1.7–7.7)
Neutrophils Relative %: 70 % (ref 43–77)

## 2011-05-17 LAB — POCT I-STAT, CHEM 8
BUN: 19 mg/dL (ref 6–23)
Creatinine, Ser: 1 mg/dL (ref 0.50–1.35)
Glucose, Bld: 128 mg/dL — ABNORMAL HIGH (ref 70–99)
Hemoglobin: 15 g/dL (ref 13.0–17.0)
Potassium: 3.9 mEq/L (ref 3.5–5.1)
Sodium: 134 mEq/L — ABNORMAL LOW (ref 135–145)

## 2011-05-17 LAB — GLUCOSE, CAPILLARY: Glucose-Capillary: 128 mg/dL — ABNORMAL HIGH (ref 70–99)

## 2011-05-17 LAB — CBC
HCT: 41.9 % (ref 39.0–52.0)
HCT: 41.3 % (ref 39.0–52.0)
Hemoglobin: 14.3 g/dL (ref 13.0–17.0)
Hemoglobin: 14.3 g/dL (ref 13.0–17.0)
MCH: 31.4 pg (ref 26.0–34.0)
MCV: 90.8 fL (ref 78.0–100.0)
RBC: 4.55 MIL/uL (ref 4.22–5.81)
WBC: 9.5 10*3/uL (ref 4.0–10.5)

## 2011-05-17 LAB — APTT: aPTT: 28 seconds (ref 24–37)

## 2011-05-17 LAB — CK TOTAL AND CKMB (NOT AT ARMC): CK, MB: 3.8 ng/mL (ref 0.3–4.0)

## 2011-05-17 LAB — HEMOGLOBIN A1C: Hgb A1c MFr Bld: 6.2 % — ABNORMAL HIGH (ref <5.7)

## 2011-05-17 MED ORDER — ASPIRIN EC 325 MG PO TBEC
325.0000 mg | DELAYED_RELEASE_TABLET | Freq: Every day | ORAL | Status: DC
  Filled 2011-05-17: qty 1

## 2011-05-17 MED ORDER — ACETAMINOPHEN 325 MG PO TABS
650.0000 mg | ORAL_TABLET | Freq: Four times a day (QID) | ORAL | Status: DC | PRN
  Administered 2011-05-18: 650 mg via ORAL
  Filled 2011-05-17: qty 2

## 2011-05-17 MED ORDER — ENOXAPARIN SODIUM 40 MG/0.4ML ~~LOC~~ SOLN
40.0000 mg | SUBCUTANEOUS | Status: DC
  Administered 2011-05-17 – 2011-05-18 (×2): 40 mg via SUBCUTANEOUS
  Filled 2011-05-17 (×4): qty 0.4

## 2011-05-17 MED ORDER — MORPHINE SULFATE 2 MG/ML IJ SOLN
2.0000 mg | Freq: Once | INTRAMUSCULAR | Status: AC
  Administered 2011-05-17: 2 mg via INTRAVENOUS
  Filled 2011-05-17: qty 1

## 2011-05-17 MED ORDER — ASPIRIN 81 MG PO CHEW
324.0000 mg | CHEWABLE_TABLET | Freq: Once | ORAL | Status: AC
  Administered 2011-05-17: 324 mg via ORAL

## 2011-05-17 MED ORDER — SENNOSIDES-DOCUSATE SODIUM 8.6-50 MG PO TABS: 1.0000 | ORAL_TABLET | Freq: Every evening | ORAL | Status: DC | PRN

## 2011-05-17 MED ORDER — FELODIPINE ER 10 MG PO TB24
10.0000 mg | ORAL_TABLET | Freq: Every day | ORAL | Status: DC
  Administered 2011-05-17 – 2011-05-18 (×2): 10 mg via ORAL
  Filled 2011-05-17 (×3): qty 1

## 2011-05-17 MED ORDER — THERA M PLUS PO TABS
1.0000 | ORAL_TABLET | Freq: Every day | ORAL | Status: DC
  Administered 2011-05-17 – 2011-05-19 (×3): 1 via ORAL
  Filled 2011-05-17 (×3): qty 1

## 2011-05-17 MED ORDER — LORATADINE 10 MG PO TABS
10.0000 mg | ORAL_TABLET | Freq: Every day | ORAL | Status: DC
  Administered 2011-05-17 – 2011-05-19 (×3): 10 mg via ORAL
  Filled 2011-05-17 (×3): qty 1

## 2011-05-17 MED ORDER — OMEGA-3-ACID ETHYL ESTERS 1 G PO CAPS
1.0000 g | ORAL_CAPSULE | Freq: Every day | ORAL | Status: DC
  Administered 2011-05-17 – 2011-05-19 (×3): 1 g via ORAL
  Filled 2011-05-17 (×3): qty 1

## 2011-05-17 MED ORDER — INSULIN ASPART 100 UNIT/ML ~~LOC~~ SOLN: 0.0000 [IU] | Freq: Every day | SUBCUTANEOUS | Status: DC

## 2011-05-17 MED ORDER — NEBIVOLOL HCL 10 MG PO TABS
10.0000 mg | ORAL_TABLET | Freq: Every day | ORAL | Status: DC
  Administered 2011-05-17 – 2011-05-18 (×2): 10 mg via ORAL
  Filled 2011-05-17 (×3): qty 1

## 2011-05-17 MED ORDER — INSULIN ASPART 100 UNIT/ML ~~LOC~~ SOLN
0.0000 [IU] | Freq: Three times a day (TID) | SUBCUTANEOUS | Status: DC
  Filled 2011-05-17: qty 3

## 2011-05-17 MED ORDER — ONDANSETRON HCL 4 MG/2ML IJ SOLN: 4.0000 mg | Freq: Four times a day (QID) | INTRAMUSCULAR | Status: DC | PRN

## 2011-05-17 MED ORDER — FAMOTIDINE 20 MG PO TABS
20.0000 mg | ORAL_TABLET | Freq: Two times a day (BID) | ORAL | Status: DC
  Administered 2011-05-17 – 2011-05-19 (×4): 20 mg via ORAL
  Filled 2011-05-17 (×5): qty 1

## 2011-05-17 NOTE — ED Notes (Signed)
Presents c/o headache, left sided facial numbness, left arm numbness, and leg numbness. Pt also has slight deviation of naso mandibular fold on left side and left sided mouth droop. All other neuro assessment wnl. Pt in no distress.

## 2011-05-17 NOTE — ED Provider Notes (Signed)
History     CSN: [id number]  Arrival date & time 05/17/11  1144   First MD Initiated Contact with Patient 05/17/11 1126      Chief Complaint  Patient presents with  . Code Stroke    (Consider location/radiation/quality/duration/timing/severity/associated sxs/prior treatment) HPI Patient is a 78 yo M who presented today as a code stroke by EMS after developing LUE and LLE numbness as well as left facial numbness and possible facial droop.  Patient also had mild dysarthria.  Patient was driving his wife home from the doctor when this developed suddenly.  Symptoms persisted when he arrived home and EMS was contacted.  Upon arrival patient has headache but reports that the numbness has resolved in the LUE and left face.  Possible left facial droop was noted and patient continued to have LLE numbness.  There were no associated motor or visual deficits.  Patient has slightly strange speech but reported that he thought his speech was normal for him. No family was at bedside.  Patient was hemodynamically stable with mild headache on my assessment.  Past Medical History  Diagnosis Date  . HTN (hypertension)   . History of lung cancer   . Hypertriglyceridemia   . CAD (coronary artery disease)   . Atrial myxoma     Left  . Carotid artery disease   . Hearing loss   . Diabetes mellitus     Past Surgical History  Procedure Date  . Cabg x 1 with reverse saphenous vein graft to distal rt coronary artery and excision of left atrial myxoma 07/12/2005    Dr Tyrone Sage  . Left pneumonectomy 05/03/1998    Dr Tyrone Sage  . Combined mediastinoscopy and bronchoscopy 05/01/1998    Dr Tyrone Sage  . Rt  carotid endarterectomy     Dr Hart [place]  . Coronary artery bypass graft     Family History  Problem Relation Age of Onset  . Heart disease Mother   . Heart disease Sister   . Stomach cancer Father     History  Substance Use Topics  . Smoking status: Former Smoker    Types: Cigarettes    Quit date:  07/21/1997  . Smokeless tobacco: Never Used  . Alcohol Use: No      Review of Systems  Constitutional: Negative.   Eyes: Negative.   Respiratory: Negative.   Cardiovascular: Negative.   Gastrointestinal: Negative.   Genitourinary: Negative.   Musculoskeletal: Negative.   Skin: Negative.   Neurological: Positive for facial asymmetry, speech difficulty, numbness and headaches.  Hematological: Negative.   Psychiatric/Behavioral: Negative.   All other systems reviewed and are negative.    Allergies  Statins and Amoxicillin  Home Medications  No current outpatient prescriptions on file.  BP 121/71  Pulse 73  Temp(Src) 98.5 F (36.9 C) (Oral)  Resp 20  Ht 5\' 11"  (1.803 m)  Wt 209 lb 7 oz (95 kg)  BMI 29.21 kg/m2  SpO2 95%  Physical Exam  Nursing note and vitals reviewed. Constitutional: He is oriented to person, place, and time. He appears well-developed and well-nourished. No distress.  HENT:  Head: Normocephalic and atraumatic.       Facial asymmetry with possible left facial droop  Eyes: Conjunctivae and EOM are normal. Pupils are equal, round, and reactive to light.  Neck: Normal range of motion.  Cardiovascular: Normal rate, regular rhythm, normal heart sounds and intact distal pulses.  Exam reveals no gallop and no friction rub.   No murmur heard. Pulmonary/Chest: Effort  normal. No respiratory distress. He has no wheezes. He has no rales.       Patient with transmitted lung sounds from the right only on the left which are diminished given history of prior left lung removal  Abdominal: Soft. Bowel sounds are normal. He exhibits no distension. There is no tenderness. There is no rebound and no guarding.  Musculoskeletal: Normal range of motion. He exhibits no edema and no tenderness.  Neurological: He is alert and oriented to person, place, and time. He exhibits normal muscle tone. Coordination normal.       LLE decreased sensation, Possible slight left facial  droop vs facial asymmetry, no other deficits noted  Skin: Skin is warm and dry. No rash noted.  Psychiatric: He has a normal mood and affect.    ED Course  Procedures (including critical care time)  CRITICAL CARE Performed by: Cyndra Numbers   Total critical care time: 30 minutes  Critical care time was exclusive of separately billable procedures and treating other patients.  Critical care was necessary to treat or prevent imminent or life-threatening deterioration.  Critical care was time spent personally by me on the following activities: development of treatment plan with patient and/or surrogate as well as nursing, discussions with consultants, evaluation of patient's response to treatment, examination of patient, obtaining history from patient or surrogate, ordering and performing treatments and interventions, ordering and review of laboratory studies, ordering and review of radiographic studies, pulse oximetry and re-evaluation of patient's condition.  Date: 05/17/2011  Rate: 86  Rhythm: normal sinus rhythm  QRS Axis: normal  Intervals: normal  ST/T Wave abnormalities: nonspecific T wave changes  Conduction Disutrbances:none  Narrative Interpretation:   Old EKG Reviewed: unchanged  Labs Reviewed  COMPREHENSIVE METABOLIC PANEL - Abnormal; Notable for the following:    Sodium 134 (*)    Chloride 94 (*)    Glucose, Bld 127 (*)    GFR calc non Af Amer 78 (*)    GFR calc Af Amer 90 (*)    All other components within normal limits  CK TOTAL AND CKMB - Abnormal; Notable for the following:    Relative Index 3.4 (*)    All other components within normal limits  POCT I-STAT, CHEM 8 - Abnormal; Notable for the following:    Sodium 134 (*)    Glucose, Bld 128 (*)    All other components within normal limits  CBC - Abnormal; Notable for the following:    WBC 10.8 (*)    All other components within normal limits  CREATININE, SERUM - Abnormal; Notable for the following:    GFR  calc non Af Amer 81 (*)    All other components within normal limits  GLUCOSE, CAPILLARY - Abnormal; Notable for the following:    Glucose-Capillary 128 (*)    All other components within normal limits  GLUCOSE, CAPILLARY - Abnormal; Notable for the following:    Glucose-Capillary 124 (*)    All other components within normal limits  PROTIME-INR  APTT  CBC  DIFFERENTIAL  TROPONIN I  HEMOGLOBIN A1C  POCT CBG MONITORING  POCT CBG MONITORING  HEMOGLOBIN A1C  LIPID PANEL   Ct Head Wo Contrast  05/17/2011  *RADIOLOGY REPORT*  Clinical Data: Sudden onset of left-sided weakness.  CT HEAD WITHOUT CONTRAST  Technique:  Contiguous axial images were obtained from the base of the skull through the vertex without contrast.  Comparison: 11/22/2006 CT.  01/03/2011 MR.  Findings: No intracranial hemorrhage.  Small vessel  disease type changes. No CT evidence of large acute infarct.  Small acute infarct cannot be excluded by CT.  Global atrophy without hydrocephalus.  No intracranial mass lesion detected on this unenhanced exam.  Vascular calcifications.  Right middle cerebral artery in the region of the sylvian fissure appears slightly dense but without change.  IMPRESSION: No intracranial hemorrhage or CT evidence of large acute infarct.  Small vessel disease type changes.  Critical Value/emergent results were called by telephone at the time of interpretation on 05/17/2011  at 11:39 am  to  Dr. Thad Ranger, who verbally acknowledged these results.  Original Report Authenticated By: Fuller Canada, M.D.     1. Numbness and tingling of left arm and leg       MDM  Patient arrived as a code stroke with stable vital signs as well as normal blood glucose. He was taken directly to scanner with Dr. Thad Ranger from the stroke team at bedside.  His symptoms were already improving and mild.  Patient was not felt to be a good candidate for TPA.  Stroke team recommended admission for monitoring.  RN swallow screen  completed and ASA given.  Patient did have return of his symptoms with worsening of his headache.  No new symptoms were noted.  Patient received morphine 2 mg IV and Dr. Thad Ranger was notified of change.  Patient will still be admitted to hospitalist.  Patient was admitted to hospitalist and plan was discussed with family.        Cyndra Numbers, MD 05/18/11 1027

## 2011-05-17 NOTE — ED Notes (Signed)
Wife's number-[phone]

## 2011-05-17 NOTE — H&P (Signed)
PCP:  Daisy Floro, MD, MD   DOA:  05/17/2011 11:44 AM  Chief Complaint:  Left facial and left-sided numbness  HPI: This is a 79 years old man who was brought him to the ER today with chief complaint of left sided and left facial numbness started about 9:45 AM. Associated with headache however he denies any motor weakness, slurring of speech or vision complaints. In the ER or the stroke was initiated, patient was seen by neurology service, CT head showed no acute findings and patient's symptoms started to improve so  Code stroke was cancelled . While in the ED his symptoms recurred again however then improved. Patient was given aspirin and we were asked to admit for further management.  Allergies: Patient denied any allergies to medication however was previously listed as below Allergies  Allergen Reactions  . Statins Other (See Comments)    Muscle aches  . Amoxicillin Rash    Prior to Admission medications   Medication Sig Start Date End Date Taking? Authorizing Provider  acetaminophen (TYLENOL) 325 MG tablet Take 650 mg by mouth every 6 (six) hours as needed. For pain   Yes Historical Provider, MD  albuterol-ipratropium (COMBIVENT) 18-103 MCG/ACT inhaler Inhale 2 puffs into the lungs every 6 (six) hours as needed. For shortness of breath   Yes Historical Provider, MD  aspirin 325 MG EC tablet Take 325 mg by mouth daily.   Yes Historical Provider, MD  cetirizine (ZYRTEC) 10 MG tablet Take 10 mg by mouth daily as needed. For allergies   Yes Historical Provider, MD  felodipine (PLENDIL) 10 MG 24 hr tablet Take 10 mg by mouth daily.   Yes Historical Provider, MD  losartan-hydrochlorothiazide (HYZAAR) 100-25 MG per tablet Take 1 tablet by mouth daily.   Yes Historical Provider, MD  metFORMIN (GLUCOPHAGE-XR) 500 MG 24 hr tablet Take 500 mg by mouth daily with breakfast.   Yes Historical Provider, MD  Multiple Vitamins-Minerals (MULTIVITAMINS THER. W/MINERALS) TABS Take 1 tablet by mouth  daily.   Yes Historical Provider, MD  nebivolol (BYSTOLIC) 10 MG tablet Take 10 mg by mouth daily.   Yes Historical Provider, MD  Omega-3 Fatty Acids (FISH OIL) 1200 MG CAPS Take 1 capsule by mouth 2 (two) times daily.   Yes Historical Provider, MD  ranitidine (ZANTAC) 150 MG tablet Take 150 mg by mouth 2 (two) times daily as needed. For indigestion   Yes Historical Provider, MD    Past Medical History  Diagnosis Date  . HTN (hypertension)   . History of lung cancer   . Hypertriglyceridemia   . CAD (coronary artery disease)   . Atrial myxoma     Left  . Carotid artery disease   . Hearing loss   . Diabetes mellitus     Past Surgical History  Procedure Date  . Cabg x 1 with reverse saphenous vein graft to distal rt coronary artery and excision of left atrial myxoma 07/12/2005    Dr Tyrone Sage  . Left pneumonectomy 05/03/1998    Dr Tyrone Sage  . Combined mediastinoscopy and bronchoscopy 05/01/1998    Dr Tyrone Sage  . Rt  carotid endarterectomy     Dr Hart [place]  . Coronary artery bypass graft     Social History:  reports that he quit smoking about 15 years ago. He reports that he does not drink alcohol or use illicit drugs.  Family History Denies family history of stroke   Problem Relation Age of Onset  . Heart disease-mother  Mother  Sister   . Stomach cancer Father     Review of Systems:  Constitutional: Denies fever, chills, diaphoresis, appetite change and fatigue.  HEENT: Denies photophobia, eye pain, redness, hearing loss, ear pain, congestion, sore throat, rhinorrhea, sneezing, mouth sores, trouble swallowing, neck pain, neck stiffness and tinnitus.   Respiratory: Denies SOB, DOE, cough, chest tightness,  and wheezing.   Cardiovascular: Denies chest pain, palpitations and leg swelling.  Gastrointestinal: Denies nausea, vomiting, abdominal pain, diarrhea, constipation, blood in stool and abdominal distention.  Genitourinary: Denies dysuria, urgency, frequency, hematuria,  flank pain and difficulty urinating.  Neurological:c/o numbness and headaches.  Denies dizziness, seizures, syncope, weakness, light-headedness,     Physical Exam:  Filed Vitals:   05/17/11 1138 05/17/11 1238 05/17/11 1338  BP: 146/69 143/76 141/72  Pulse: 88 91 94  Temp:   98.6 F (37 C)  TempSrc:   Oral  Resp: 14 15 14   SpO2: 95% 95% 92%    Constitutional: Vital signs reviewed.  Patient is a in no acute distress and cooperative with exam. Alert and oriented x3.  Head: Normocephalic and atraumatic Eyes: PERRL, EOMI, conjunctivae normal, No scleral icterus.  Neck: Supple, Trachea midline normal ROM, No JVD, mass, thyromegaly, or carotid bruit present.  Cardiovascular: RRR, S1 normal, S2 normal, no MRG, pulses symmetric and intact bilaterally Pulmonary/Chest: CTAB, no wheezes, rales, or rhonchi Abdominal: Soft. Non-tender, non-distended, bowel sounds are normal, no masses, organomegaly, or guarding present.  Ext: no edema and no cyanosis, pulses palpable bilaterally  Neurological: A&O x3, flat right nasolabial fold and mouth deviated to the left.Strenght is normal and symmetric bilaterally, no focal motor deficit, sensory intact to light touch bilaterally.    Labs on Admission:  Results for orders placed during the hospital encounter of 05/17/11 (from the past 48 hour(s))  PROTIME-INR     Status: Normal   Collection Time   05/17/11 11:25 AM      Component Value Range Comment   Prothrombin Time 12.8  11.6 - 15.2 (seconds)    INR 0.94  0.00 - 1.49    APTT     Status: Normal   Collection Time   05/17/11 11:25 AM      Component Value Range Comment   aPTT 28  24 - 37 (seconds)   CBC     Status: Normal   Collection Time   05/17/11 11:25 AM      Component Value Range Comment   WBC 9.5  4.0 - 10.5 (K/uL)    RBC 4.58  4.22 - 5.81 (MIL/uL)    Hemoglobin 14.3  13.0 - 17.0 (g/dL)    HCT 62.1  30.8 - 65.7 (%)    MCV 91.5  78.0 - 100.0 (fL)    MCH 31.2  26.0 - 34.0 (pg)    MCHC 34.1   30.0 - 36.0 (g/dL)    RDW 84.6  96.2 - 95.2 (%)    Platelets 191  150 - 400 (K/uL)   DIFFERENTIAL     Status: Normal   Collection Time   05/17/11 11:25 AM      Component Value Range Comment   Neutrophils Relative 70  43 - 77 (%)    Neutro Abs 6.7  1.7 - 7.7 (K/uL)    Lymphocytes Relative 19  12 - 46 (%)    Lymphs Abs 1.8  0.7 - 4.0 (K/uL)    Monocytes Relative 8  3 - 12 (%)    Monocytes Absolute 0.8  0.1 - 1.0 (K/uL)  Eosinophils Relative 3  0 - 5 (%)    Eosinophils Absolute 0.3  0.0 - 0.7 (K/uL)    Basophils Relative 0  0 - 1 (%)    Basophils Absolute 0.0  0.0 - 0.1 (K/uL)   COMPREHENSIVE METABOLIC PANEL     Status: Abnormal   Collection Time   05/17/11 11:25 AM      Component Value Range Comment   Sodium 134 (*) 135 - 145 (mEq/L)    Potassium 3.8  3.5 - 5.1 (mEq/L)    Chloride 94 (*) 96 - 112 (mEq/L)    CO2 32  19 - 32 (mEq/L)    Glucose, Bld 127 (*) 70 - 99 (mg/dL)    BUN 17  6 - 23 (mg/dL)    Creatinine, Ser 1.61  0.50 - 1.35 (mg/dL)    Calcium 09.6  8.4 - 10.5 (mg/dL)    Total Protein 6.8  6.0 - 8.3 (g/dL)    Albumin 3.8  3.5 - 5.2 (g/dL)    AST 17  0 - 37 (U/L)    ALT 16  0 - 53 (U/L)    Alkaline Phosphatase 70  39 - 117 (U/L)    Total Bilirubin 0.4  0.3 - 1.2 (mg/dL)    GFR calc non Af Amer 78 (*) >90 (mL/min)    GFR calc Af Amer 90 (*) >90 (mL/min)   CK TOTAL AND CKMB     Status: Abnormal   Collection Time   05/17/11 11:25 AM      Component Value Range Comment   Total CK 112  7 - 232 (U/L)    CK, MB 3.8  0.3 - 4.0 (ng/mL)    Relative Index 3.4 (*) 0.0 - 2.5    TROPONIN I     Status: Normal   Collection Time   05/17/11 11:25 AM      Component Value Range Comment   Troponin I <0.30  <0.30 (ng/mL)   POCT I-STAT, CHEM 8     Status: Abnormal   Collection Time   05/17/11 11:37 AM      Component Value Range Comment   Sodium 134 (*) 135 - 145 (mEq/L)    Potassium 3.9  3.5 - 5.1 (mEq/L)    Chloride 98  96 - 112 (mEq/L)    BUN 19  6 - 23 (mg/dL)    Creatinine, Ser  0.45  0.50 - 1.35 (mg/dL)    Glucose, Bld 409 (*) 70 - 99 (mg/dL)    Calcium, Ion 8.11  1.12 - 1.32 (mmol/L)    TCO2 31  0 - 100 (mmol/L)    Hemoglobin 15.0  13.0 - 17.0 (g/dL)    HCT 91.4  78.2 - 95.6 (%)     Radiological Exams on Admission: Ct Head Wo Contrast  05/17/2011  *RADIOLOGY REPORT*  Clinical Data: Sudden onset of left-sided weakness.  CT HEAD WITHOUT  IMPRESSION: No intracranial hemorrhage or CT evidence of large acute infarct.  Small vessel disease type changes.  Critical Value/emergent results were called by telephone at the time of interpretation on 05/17/2011  at 11:39 am  to  Dr. Thad Ranger, who verbally acknowledged these results.  Original Report Authenticated By: Fuller Canada, M.D.    Assessment/Plan Principal Problem:  *Numbness and tingling of left arm and leg Active Problems:  HTN (hypertension)  Diabetes mellitus History of lung cancer status post pneumonectomy on nocturnal home oxygen. History of atrial myxoma status post exertion History of coronary disease status post  CABG Plan Admit to neurology /[facility] floor Stroke workup including MRI/MRA head, 2-D echocardiogram, carotid Doppler. Continue aspirin Neurology saw the patient and the ER, however consult note is not available at this time, follow any further neurology recommendations. Swallow screen, neuro  checks as per orders Check hemoglobin A1c and lipid panel Hold metformin, place on insulin sliding scale. Continue Bystolic  and felodipine, hold losartan/HCTZ for now. Nocturnal 02 supplementation to keep the O2 sat more than 92%. Full code. Further plan as per hospital progress and consultant input.   Time Spent on Admission: Approximately 50 minutes  Toney Difatta 05/17/2011, 2:59 PM

## 2011-05-17 NOTE — ED Notes (Signed)
Analgesia has decreased headache but numbness on left side remains.

## 2011-05-17 NOTE — ED Notes (Signed)
3023-01 Ready 

## 2011-05-17 NOTE — ED Notes (Addendum)
Pt states sx had been subsiding since presenting to the hospital but now states numbness in face arm and leg increasing. No motor components affected. edp aware and neuro being notified.

## 2011-05-17 NOTE — Progress Notes (Signed)
VASCULAR LAB PRELIMINARY  PRELIMINARY  PRELIMINARY  PRELIMINARY  Carotid duplex  completed.    Preliminary report:  Right:  No evidence of hemodynamically significant internal carotid artery stenosis.  CEA patent  Left:  60-79% internal carotid artery stenosis.  Bilateral:  Vertebral artery flow is antegrade.     Terance Hart, RVT 05/17/2011, 4:34 PM

## 2011-05-17 NOTE — ED Notes (Addendum)
Stroke log: Encoded and called: 1059 Pt arrival: 1120 EDP: 1121 Stroke Team: 1102 LSN: [id number] Arrival in CT: 1103 Lab: 1100 CT Read: 1139 Code stroke cancelled: 1145

## 2011-05-17 NOTE — Consult Note (Signed)
Referring Physician: Alto Denver    Chief Complaint: Left sided numbness  HPI: Calvin [name] is an 78 y.o. male who reports that he awakened normal today.  Was coming from an appointment with his wife and noted numbness in his LLE.  By the time he reached home his left face and left upper extremity was numb as well.  EMS was called and patient was brought in as a code stroke.  Initial NIHSS was 1.  After presentation symptoms resolved with only some mild facial asymmetry as residual.  LSN: [id number] tPA Given: No: Resolution of symptoms mRankin: 0  Past Medical History  Diagnosis Date  . HTN (hypertension)   . History of lung cancer   . Hypertriglyceridemia   . CAD (coronary artery disease)   . Atrial myxoma     Left  . Carotid artery disease   . Hearing loss   . Diabetes mellitus     Past Surgical History  Procedure Date  . Cabg x 1 with reverse saphenous vein graft to distal rt coronary artery and excision of left atrial myxoma 07/12/2005    Dr Tyrone Sage  . Left pneumonectomy 05/03/1998    Dr Tyrone Sage  . Combined mediastinoscopy and bronchoscopy 05/01/1998    Dr Tyrone Sage  . Rt  carotid endarterectomy     Dr Hart [place]  . Coronary artery bypass graft     Family History  Problem Relation Age of Onset  . Heart disease Mother   . Heart disease Sister   . Stomach cancer Father    Social History:  reports that he quit smoking about 13 years ago. His smoking use included Cigarettes. He has never used smokeless tobacco. He reports that he does not drink alcohol or use illicit drugs.  Allergies:  Allergies  Allergen Reactions  . Statins Other (See Comments)    Muscle aches  . Amoxicillin Rash    Medications:  I have reviewed the patient's current medications. Prior to Admission:  Prescriptions prior to admission  Medication Sig Dispense Refill  . acetaminophen (TYLENOL) 325 MG tablet Take 650 mg by mouth every 6 (six) hours as needed. For pain      . albuterol-ipratropium (COMBIVENT)  18-103 MCG/ACT inhaler Inhale 2 puffs into the lungs every 6 (six) hours as needed. For shortness of breath      . aspirin 325 MG EC tablet Take 325 mg by mouth daily.      . cetirizine (ZYRTEC) 10 MG tablet Take 10 mg by mouth daily as needed. For allergies      . felodipine (PLENDIL) 10 MG 24 hr tablet Take 10 mg by mouth daily.      Marland Kitchen losartan-hydrochlorothiazide (HYZAAR) 100-25 MG per tablet Take 1 tablet by mouth daily.      . metFORMIN (GLUCOPHAGE-XR) 500 MG 24 hr tablet Take 500 mg by mouth daily with breakfast.      . Multiple Vitamins-Minerals (MULTIVITAMINS THER. W/MINERALS) TABS Take 1 tablet by mouth daily.      . nebivolol (BYSTOLIC) 10 MG tablet Take 10 mg by mouth daily.      . Omega-3 Fatty Acids (FISH OIL) 1200 MG CAPS Take 1 capsule by mouth 2 (two) times daily.      . ranitidine (ZANTAC) 150 MG tablet Take 150 mg by mouth 2 (two) times daily as needed. For indigestion        ROS: History obtained from the patient  General ROS: negative for - chills, fatigue, fever, night sweats, weight gain  or weight loss Psychological ROS: negative for - behavioral disorder, hallucinations, memory difficulties, mood swings or suicidal ideation Ophthalmic ROS: negative for - blurry vision, double vision, eye pain or loss of vision ENT ROS: negative for - epistaxis, nasal discharge, oral lesions, sore throat, tinnitus or vertigo Allergy and Immunology ROS: negative for - hives or itchy/watery eyes Hematological and Lymphatic ROS: negative for - bleeding problems, bruising or swollen lymph nodes Endocrine ROS: negative for - galactorrhea, hair pattern changes, polydipsia/polyuria or temperature intolerance Respiratory ROS: negative for - cough, hemoptysis, shortness of breath or wheezing Cardiovascular ROS: negative for - chest pain, dyspnea on exertion, edema or irregular heartbeat Gastrointestinal ROS: negative for - abdominal pain, diarrhea, hematemesis, nausea/vomiting or stool  incontinence Genito-Urinary ROS: negative for - dysuria, hematuria, incontinence or urinary frequency/urgency Musculoskeletal ROS: negative for - joint swelling or muscular weakness Neurological ROS: as noted in HPI Dermatological ROS: negative for rash and skin lesion changes  Physical Examination: Blood pressure 150/72, pulse 92, temperature 98.3 F (36.8 C), temperature source Oral, resp. rate 18, height 5\' 11"  (1.803 m), weight 95 kg (209 lb 7 oz), SpO2 94.00%.  Neurologic Examination: Mental Status: Alert, oriented, thought content appropriate.  Speech fluent without evidence of aphasia but dysarthric.  Able to follow 3 step commands without difficulty. Cranial Nerves: II: visual fields grossly normal, pupils equal, round, reactive to light and accommodation III,IV, VI: ptosis not present, extra-ocular motions intact bilaterally V,VII: decrease in right NLF, facial light touch sensation normal bilaterally VIII: hearing normal bilaterally IX,X: gag reflex present XI: trapezius strength/neck flexion strength normal bilaterally XII: tongue strength normal  Motor: Right : Upper extremity   5/5    Left:     Upper extremity   5/5  Lower extremity   5/5     Lower extremity   5/5 Tone and bulk:normal tone throughout; no atrophy noted Sensory: Pinprick and light touch intact throughout, bilaterally Deep Tendon Reflexes: 2+ in the upper extremities, 1+ at the knees and ankles Plantars: Right: equivocal    Left: upgoing Cerebellar: normal finger-to-nose and normal heel-to-shin test   Results for orders placed during the hospital encounter of 05/17/11 (from the past 48 hour(s))  PROTIME-INR     Status: Normal   Collection Time   05/17/11 11:25 AM      Component Value Range Comment   Prothrombin Time 12.8  11.6 - 15.2 (seconds)    INR 0.94  0.00 - 1.49    APTT     Status: Normal   Collection Time   05/17/11 11:25 AM      Component Value Range Comment   aPTT 28  24 - 37 (seconds)     CBC     Status: Normal   Collection Time   05/17/11 11:25 AM      Component Value Range Comment   WBC 9.5  4.0 - 10.5 (K/uL)    RBC 4.58  4.22 - 5.81 (MIL/uL)    Hemoglobin 14.3  13.0 - 17.0 (g/dL)    HCT 16.1  09.6 - 04.5 (%)    MCV 91.5  78.0 - 100.0 (fL)    MCH 31.2  26.0 - 34.0 (pg)    MCHC 34.1  30.0 - 36.0 (g/dL)    RDW 40.9  81.1 - 91.4 (%)    Platelets 191  150 - 400 (K/uL)   DIFFERENTIAL     Status: Normal   Collection Time   05/17/11 11:25 AM      Component  Value Range Comment   Neutrophils Relative 70  43 - 77 (%)    Neutro Abs 6.7  1.7 - 7.7 (K/uL)    Lymphocytes Relative 19  12 - 46 (%)    Lymphs Abs 1.8  0.7 - 4.0 (K/uL)    Monocytes Relative 8  3 - 12 (%)    Monocytes Absolute 0.8  0.1 - 1.0 (K/uL)    Eosinophils Relative 3  0 - 5 (%)    Eosinophils Absolute 0.3  0.0 - 0.7 (K/uL)    Basophils Relative 0  0 - 1 (%)    Basophils Absolute 0.0  0.0 - 0.1 (K/uL)   COMPREHENSIVE METABOLIC PANEL     Status: Abnormal   Collection Time   05/17/11 11:25 AM      Component Value Range Comment   Sodium 134 (*) 135 - 145 (mEq/L)    Potassium 3.8  3.5 - 5.1 (mEq/L)    Chloride 94 (*) 96 - 112 (mEq/L)    CO2 32  19 - 32 (mEq/L)    Glucose, Bld 127 (*) 70 - 99 (mg/dL)    BUN 17  6 - 23 (mg/dL)    Creatinine, Ser 0.45  0.50 - 1.35 (mg/dL)    Calcium 40.9  8.4 - 10.5 (mg/dL)    Total Protein 6.8  6.0 - 8.3 (g/dL)    Albumin 3.8  3.5 - 5.2 (g/dL)    AST 17  0 - 37 (U/L)    ALT 16  0 - 53 (U/L)    Alkaline Phosphatase 70  39 - 117 (U/L)    Total Bilirubin 0.4  0.3 - 1.2 (mg/dL)    GFR calc non Af Amer 78 (*) >90 (mL/min)    GFR calc Af Amer 90 (*) >90 (mL/min)   CK TOTAL AND CKMB     Status: Abnormal   Collection Time   05/17/11 11:25 AM      Component Value Range Comment   Total CK 112  7 - 232 (U/L)    CK, MB 3.8  0.3 - 4.0 (ng/mL)    Relative Index 3.4 (*) 0.0 - 2.5    TROPONIN I     Status: Normal   Collection Time   05/17/11 11:25 AM      Component Value Range  Comment   Troponin I <0.30  <0.30 (ng/mL)   POCT I-STAT, CHEM 8     Status: Abnormal   Collection Time   05/17/11 11:37 AM      Component Value Range Comment   Sodium 134 (*) 135 - 145 (mEq/L)    Potassium 3.9  3.5 - 5.1 (mEq/L)    Chloride 98  96 - 112 (mEq/L)    BUN 19  6 - 23 (mg/dL)    Creatinine, Ser 8.11  0.50 - 1.35 (mg/dL)    Glucose, Bld 914 (*) 70 - 99 (mg/dL)    Calcium, Ion 7.82  1.12 - 1.32 (mmol/L)    TCO2 31  0 - 100 (mmol/L)    Hemoglobin 15.0  13.0 - 17.0 (g/dL)    HCT 95.6  21.3 - 08.6 (%)   CBC     Status: Abnormal   Collection Time   05/17/11  4:12 PM      Component Value Range Comment   WBC 10.8 (*) 4.0 - 10.5 (K/uL)    RBC 4.55  4.22 - 5.81 (MIL/uL)    Hemoglobin 14.3  13.0 - 17.0 (g/dL)    HCT  41.3  39.0 - 52.0 (%)    MCV 90.8  78.0 - 100.0 (fL)    MCH 31.4  26.0 - 34.0 (pg)    MCHC 34.6  30.0 - 36.0 (g/dL)    RDW 16.1  09.6 - 04.5 (%)    Platelets 207  150 - 400 (K/uL)   CREATININE, SERUM     Status: Abnormal   Collection Time   05/17/11  4:12 PM      Component Value Range Comment   Creatinine, Ser 0.88  0.50 - 1.35 (mg/dL)    GFR calc non Af Amer 81 (*) >90 (mL/min)    GFR calc Af Amer >90  >90 (mL/min)   GLUCOSE, CAPILLARY     Status: Abnormal   Collection Time   05/17/11  4:53 PM      Component Value Range Comment   Glucose-Capillary 128 (*) 70 - 99 (mg/dL)    Ct Head Wo Contrast  05/17/2011  *RADIOLOGY REPORT*  Clinical Data: Sudden onset of left-sided weakness.  CT HEAD WITHOUT CONTRAST  Technique:  Contiguous axial images were obtained from the base of the skull through the vertex without contrast.  Comparison: 11/22/2006 CT.  01/03/2011 MR.  Findings: No intracranial hemorrhage.  Small vessel disease type changes. No CT evidence of large acute infarct.  Small acute infarct cannot be excluded by CT.  Global atrophy without hydrocephalus.  No intracranial mass lesion detected on this unenhanced exam.  Vascular calcifications.  Right middle cerebral  artery in the region of the sylvian fissure appears slightly dense but without change.  IMPRESSION: No intracranial hemorrhage or CT evidence of large acute infarct.  Small vessel disease type changes.  Critical Value/emergent results were called by telephone at the time of interpretation on 05/17/2011  at 11:39 am  to  Dr. Thad Ranger, who verbally acknowledged these results.  Original Report Authenticated By: Fuller Canada, M.D.    Assessment: 78 y.o. male presenting with left sided numbness and dysarthria.  It should be noted that dentures were not present during evaluation.  Patient reported resolution of symptoms and CT of the brain was unremarkable.  Patent with multiple stroke risk factors.  TIA work up appropriate.  Stroke Risk Factors - carotid stenosis, diabetes mellitus, hyperlipidemia, hypertension and CAD  Plan: 1. HgbA1c, fasting lipid panel 2. MRI, MRA  of the brain without contrast 3. PT consult, OT consult, Speech consult 4. Echocardiogram 5. Carotid dopplers 6. Prophylactic therapy-Antiplatelet med: Plavix - dose 75mg  daily 7. Risk factor modification 8. Telemetry monitoring   Thana Farr, MD [facility] [phone] 05/17/2011, 6:23 PM

## 2011-05-18 ENCOUNTER — Inpatient Hospital Stay (HOSPITAL_COMMUNITY): Payer: Medicare Other

## 2011-05-18 LAB — LIPID PANEL
LDL Cholesterol: 144 mg/dL — ABNORMAL HIGH (ref 0–99)
Triglycerides: 291 mg/dL — ABNORMAL HIGH (ref <150)

## 2011-05-18 LAB — GLUCOSE, CAPILLARY
Glucose-Capillary: 121 mg/dL — ABNORMAL HIGH (ref 70–99)
Glucose-Capillary: 120 mg/dL — ABNORMAL HIGH (ref 70–99)

## 2011-05-18 LAB — HEMOGLOBIN A1C: Hgb A1c MFr Bld: 6 % — ABNORMAL HIGH (ref <5.7)

## 2011-05-18 MED ORDER — CLOPIDOGREL BISULFATE 75 MG PO TABS
75.0000 mg | ORAL_TABLET | Freq: Every day | ORAL | Status: DC
  Administered 2011-05-18: 75 mg via ORAL
  Filled 2011-05-18 (×2): qty 1

## 2011-05-18 MED ORDER — ROSUVASTATIN CALCIUM 10 MG PO TABS
10.0000 mg | ORAL_TABLET | Freq: Every day | ORAL | Status: DC
  Administered 2011-05-18: 10 mg via ORAL
  Filled 2011-05-18 (×2): qty 1

## 2011-05-18 MED ORDER — LORAZEPAM 2 MG/ML IJ SOLN
1.0000 mg | Freq: Once | INTRAMUSCULAR | Status: AC
  Administered 2011-05-18: 1 mg via INTRAVENOUS
  Filled 2011-05-18: qty 1

## 2011-05-18 MED ORDER — CLOPIDOGREL BISULFATE 75 MG PO TABS: 75.0000 mg | ORAL_TABLET | Freq: Every day | ORAL | Status: DC

## 2011-05-18 NOTE — Progress Notes (Signed)
Subjective: HEADACHE  Objective: Weight change:   Intake/Output Summary (Last 24 hours) at 05/18/11 1447 Last data filed at 05/18/11 1300  Gross per 24 hour  Intake    720 ml  Output    300 ml  Net    420 ml    Filed Vitals:   05/18/11 1334  BP: 136/73  Pulse: 69  Temp: 98 F (36.7 C)  Resp: 18  Head: Normocephalic and atraumatic   Cardiovascular: RRR, S1 normal, S2 normal, no MRG, pulses symmetric and intact bilaterally  Pulmonary/Chest: CTAB, no wheezes, rales, or rhonchi  Abdominal: Soft. Non-tender, non-distended, bowel sounds are normal, no masses, organomegaly, or guarding present.  Ext: no edema and no cyanosis, pulses palpable bilaterally  Neurological: A&O x3, PERSISTENT facial droop. Strenght is normal and symmetric bilaterally, no focal motor deficit, sensory intact to light touch bilaterally.     Lab Results: Results for orders placed during the hospital encounter of 05/17/11 (from the past 24 hour(s))  HEMOGLOBIN A1C     Status: Abnormal   Collection Time   05/17/11  4:12 PM      Component Value Range   Hemoglobin A1C 6.2 (*) <5.7 (%)   Mean Plasma Glucose 131 (*) <117 (mg/dL)  CBC     Status: Abnormal   Collection Time   05/17/11  4:12 PM      Component Value Range   WBC 10.8 (*) 4.0 - 10.5 (K/uL)   RBC 4.55  4.22 - 5.81 (MIL/uL)   Hemoglobin 14.3  13.0 - 17.0 (g/dL)   HCT 69.6  29.5 - 28.4 (%)   MCV 90.8  78.0 - 100.0 (fL)   MCH 31.4  26.0 - 34.0 (pg)   MCHC 34.6  30.0 - 36.0 (g/dL)   RDW 13.2  44.0 - 10.2 (%)   Platelets 207  150 - 400 (K/uL)  CREATININE, SERUM     Status: Abnormal   Collection Time   05/17/11  4:12 PM      Component Value Range   Creatinine, Ser 0.88  0.50 - 1.35 (mg/dL)   GFR calc non Af Amer 81 (*) >90 (mL/min)   GFR calc Af Amer >90  >90 (mL/min)  GLUCOSE, CAPILLARY     Status: Abnormal   Collection Time   05/17/11  4:53 PM      Component Value Range   Glucose-Capillary 128 (*) 70 - 99 (mg/dL)  GLUCOSE, CAPILLARY      Status: Abnormal   Collection Time   05/17/11  9:16 PM      Component Value Range   Glucose-Capillary 124 (*) 70 - 99 (mg/dL)   Comment 1 Documented in Chart     Comment 2 Notify RN    HEMOGLOBIN A1C     Status: Abnormal   Collection Time   05/18/11  5:00 AM      Component Value Range   Hemoglobin A1C 6.0 (*) <5.7 (%)   Mean Plasma Glucose 126 (*) <117 (mg/dL)  LIPID PANEL     Status: Abnormal   Collection Time   05/18/11  5:00 AM      Component Value Range   Cholesterol 238 (*) 0 - 200 (mg/dL)   Triglycerides 725 (*) <150 (mg/dL)   HDL 36 (*) >36 (mg/dL)   Total CHOL/HDL Ratio 6.6     VLDL 58 (*) 0 - 40 (mg/dL)   LDL Cholesterol 644 (*) 0 - 99 (mg/dL)  GLUCOSE, CAPILLARY     Status: Abnormal  Collection Time   05/18/11  7:04 AM      Component Value Range   Glucose-Capillary 121 (*) 70 - 99 (mg/dL)  GLUCOSE, CAPILLARY     Status: Abnormal   Collection Time   05/18/11 11:58 AM      Component Value Range   Glucose-Capillary 120 (*) 70 - 99 (mg/dL)    Micro Results: No results found for this or any previous visit (from the past 240 hour(s)).  Studies/Results: Dg Chest 2 View  05/09/2011  *RADIOLOGY REPORT*  Clinical Data: History of left lung cancer 13 years ago  CHEST - 2 VIEW  Comparison: November 01, 2010  Findings: Opacification, volume loss, and pleural calcification, consistent with prior pneumonectomy, are again noted on the left and unchanged.  The right lung remains clear.  Degenerative changes appear stable within the mid thoracic spine.  IMPRESSION: Stable chest x-ray with evidence of prior left pneumonectomy.  Original Report Authenticated By: Brandon Melnick, M.D.   Ct Head Wo Contrast  05/17/2011  *RADIOLOGY REPORT*  Clinical Data: Sudden onset of left-sided weakness.  CT HEAD WITHOUT CONTRAST  Technique:  Contiguous axial images were obtained from the base of the skull through the vertex without contrast.  Comparison: 11/22/2006 CT.  01/03/2011 MR.  Findings: No  intracranial hemorrhage.  Small vessel disease type changes. No CT evidence of large acute infarct.  Small acute infarct cannot be excluded by CT.  Global atrophy without hydrocephalus.  No intracranial mass lesion detected on this unenhanced exam.  Vascular calcifications.  Right middle cerebral artery in the region of the sylvian fissure appears slightly dense but without change.  IMPRESSION: No intracranial hemorrhage or CT evidence of large acute infarct.  Small vessel disease type changes.  Critical Value/emergent results were called by telephone at the time of interpretation on 05/17/2011  at 11:39 am  to  Dr. Thad Ranger, who verbally acknowledged these results.  Original Report Authenticated By: Fuller Canada, M.D.   Medications: Scheduled Meds:   . clopidogrel  75 mg Oral Q breakfast  . enoxaparin  40 mg Subcutaneous Q24H  . famotidine  20 mg Oral BID  . felodipine  10 mg Oral Daily  . insulin aspart  0-15 Units Subcutaneous TID WC  . insulin aspart  0-5 Units Subcutaneous QHS  . loratadine  10 mg Oral Daily  . LORazepam  1 mg Intravenous Once  . multivitamins ther. w/minerals  1 tablet Oral Daily  . nebivolol  10 mg Oral Daily  . omega-3 acid ethyl esters  1 g Oral Daily  . rosuvastatin  10 mg Oral q1800  . DISCONTD: aspirin  325 mg Oral Daily  . DISCONTD: clopidogrel  75 mg Oral Q breakfast   Continuous Infusions:  PRN Meds:.acetaminophen, ondansetron (ZOFRAN) IV, senna-docusate  Assessment/Plan:  LOS: 1 day  Numbness and Tingling of teh left arm and leg: improving. CVA work up pending. On plavix and crestor. Neurology input appreciated.  Diabetes Mellitus: continue with SSI CAD s/p CABG: Continue with plavix, crestor and nebivolol.   HTN: controlled. PT/OT  Evaluation.  Halo Laski 05/18/2011, 2:47 PM

## 2011-05-18 NOTE — Progress Notes (Signed)
Stroke Team Progress Note  HISTORY Calvin [name] is an 78 y.o. male who reports that he awakened normal 1/125/13. Was coming from an appointment with his wife and noted numbness in his LLE. By the time he reached home his left face and left upper extremity was numb as well. EMS was called and patient was brought in as a code stroke. Initial NIHSS was 1. After presentation symptoms resolved with only some mild facial asymmetry as residual.  LSN: [id number]  tPA Given: No: Resolution of symptoms  mRankin: 0 NIH- Interval: Baseline (01/25 1149) Level of Consciousness (1a. ): Alert, keenly responsive (01/25 2000) LOC Questions (1b. ): Answers both questions correctly (01/25 2000) LOC Commands (1c. ): Performs both tasks correctly (01/25 2000) Best Gaze (2. ): Normal (01/25 2000) Visual (3. ): No visual loss (01/25 2000) Facial Palsy (4. ): Normal symmetrical movements (01/25 2000) Motor Arm, Left (5a. ): No drift (01/25 2000) Motor Arm, Right (5b. ): No drift (01/25 2000) Motor Leg, Left (6a. ): No drift (01/25 2000) Motor Leg, Right (6b. ): No drift (01/25 2000) Limb Ataxia (7. ): Absent (01/25 2000) Sensory (8. ): Normal, no sensory loss (01/25 2000) Best Language (9. ): Mild-to-moderate aphasia (01/25 2000) Dysarthria (10. ): Mild-to-moderate dysarthria, patient slurs at least some words and, at worst, can be understood with some difficulty (01/25 2000) Inattention/Extinction: No Abnormality (01/25 2000) Total: 2  (01/25 2000)  SUBJECTIVE [name], but improved from yesterday.  Was unable to do MRI this am due to SOB and L shoulder pain.  Wears O2 at night at home.  OBJECTIVE Most recent Vital Signs: Temp: 98.4 F  (01/26 0558) Temp src: Oral  BP: 126/73 mmHg (01/26 0558) Pulse Rate: 65  (01/26 0558) Respiratory Rate: 18 O2 Saturation: 93%  CBG (last 3)  Basename 05/18/11 0704 05/17/11 2116 05/17/11 1653  GLUCAP 121* 124* 128*    Current Meds: .  aspirin  325 mg Oral Daily  . enoxaparin  40 mg Subcutaneous Q24H  . famotidine  20 mg Oral BID  . felodipine  10 mg Oral Daily  . insulin aspart  0-15 Units Subcutaneous TID WC  . insulin aspart  0-5 Units Subcutaneous QHS  . loratadine  10 mg Oral Daily  .  morphine injection  2 mg Intravenous Once  . multivitamins ther. w/minerals  1 tablet Oral Daily  . nebivolol  10 mg Oral Daily  . omega-3 acid ethyl esters  1 g Oral Daily  PRN acetaminophen, ondansetron (ZOFRAN) IV, senna-docusate  Diet:  Carb Control, thin liquids Activity:  Up with assistance DVT Prophylaxis:  lovenox  Mental Status: Alert, oriented, thought content appropriate.  Speech fluent with mild dysarthria, no aphasia. Able to follow 3 step commands without difficulty. Cranial Nerves: II- Visual fields grossly intact. III/IV/VI-Extraocular movements intact.  Pupils reactive bilaterally. V/VII-flattened L NLF, VIII-grossly intact IX/X-normal gag XI-bilateral shoulder shrug XII-midline tongue extension Motor: 5/5 bilaterally with normal tone and bulk. Sensory:  light touch intact throughout, bilaterally.   Deep Tendon Reflexes: 2+ UEs/ 1+ LEs Plantars: Downgoing bilaterally Cerebellar: Normal finger-to-nose, normal rapid alternating movements and normal heel-to-shin test.  Normal gait and station.   Significant Diagnostic Studies: CBC    Component Value Date/Time   WBC 10.8* 05/17/2011 1612   RBC 4.55 05/17/2011 1612   HGB 14.3 05/17/2011 1612   HCT 41.3 05/17/2011 1612   PLT 207 05/17/2011 1612   MCV 90.8 05/17/2011 1612  MCH 31.4 05/17/2011 1612   MCHC 34.6 05/17/2011 1612   RDW 12.9 05/17/2011 1612   LYMPHSABS 1.8 05/17/2011 1125   MONOABS 0.8 05/17/2011 1125   EOSABS 0.3 05/17/2011 1125   BASOSABS 0.0 05/17/2011 1125   CMP    Component Value Date/Time   NA 134* 05/17/2011 1137   K 3.9 05/17/2011 1137   CL 98 05/17/2011 1137   CO2 32 05/17/2011 1125   GLUCOSE 128* 05/17/2011 1137   BUN 19 05/17/2011 1137    CREATININE 0.88 05/17/2011 1612   CALCIUM 10.1 05/17/2011 1125   PROT 6.8 05/17/2011 1125   ALBUMIN 3.8 05/17/2011 1125   AST 17 05/17/2011 1125   ALT 16 05/17/2011 1125   ALKPHOS 70 05/17/2011 1125   BILITOT 0.4 05/17/2011 1125   GFRNONAA 81* 05/17/2011 1612   GFRAA >90 05/17/2011 1612   COAGS Lab Results  Component Value Date   INR 0.94 05/17/2011   Lipid Panel    Component Value Date/Time   CHOL 238* 05/18/2011 0500   TRIG 291* 05/18/2011 0500   HDL 36* 05/18/2011 0500   CHOLHDL 6.6 05/18/2011 0500   VLDL 58* 05/18/2011 0500   LDLCALC 144* 05/18/2011 0500   HgbA1C  Lab Results  Component Value Date   HGBA1C 6.2* 05/17/2011    05/17/2011   CT HEAD WITHOUT CONTRAST  Findings: No intracranial hemorrhage.  Small vessel disease type changes. No CT evidence of large acute infarct.  Small acute infarct cannot be excluded by CT.  Global atrophy without hydrocephalus.  No intracranial mass lesion detected on this unenhanced exam.  Vascular calcifications.  Right middle cerebral artery in the region of the sylvian fissure appears slightly dense but without change.  IMPRESSION: No intracranial hemorrhage or CT evidence of large acute infarct.  Small vessel disease type changes. Calvin [name], M.D.    05/17/11 Carotid Dopplers:  Preliminary report: Right: No evidence of hemodynamically significant internal carotid artery stenosis. CEA patent Left: 60-79% internal carotid artery stenosis. Bilateral: Vertebral artery flow is antegrade.   Therapies: ST: no need identified. PT: pending ASSESSMENT [name] is a 78 y.o. male with   1. Probable R CVA, L sided weakness improving. Unable to tolerate MRI this am.  Rec re-try with ativan and   O2 [place]el.  Primary team to manage. Head CT neg.  2. Hyperlipidemia- not on therapy.  LDL 144, goal <80  Has had myalgia with statins in past.    3. DM2- A1C controlled.  Being covered with SSI, on metformin at home.  4. CAD- asymp.  5. HTN- controlled.  Stroke  risk factors: HTN, Hyperlipidemia, DM, CAD,  obesity Hospital day # 1  TREATMENT/PLAN 1. Add Plavix 75 mg PO Q am for secondary stroke prevention.  D/C asa 2. Add crestor 10 mg PO QD.- if myalgias develop, consider pravachol and zetia or Welchol. 3. Re-try MRI head as above. 4. Physical Therapy consult pending.  Marya Fossa PA-C [facility] [phone] 05/18/11 1010  Pt seen and examined by Dr. Anne Hahn who agrees with above.

## 2011-05-18 NOTE — Progress Notes (Signed)
  Echocardiogram 2D Echocardiogram has been performed.  Calvin [name], Real Cons 05/18/2011, 4:40 PM

## 2011-05-18 NOTE — Evaluation (Signed)
Calvin [name],PT Acute Rehabilitation [phone] (pager)  

## 2011-05-18 NOTE — Evaluation (Signed)
Speech Language Pathology Evaluation Patient Details Name: Calvin [name] MRN: [id number] DOB: May 28, 1932 Today's Date: 05/18/2011  Problem List:  Patient Active Problem List  Diagnoses  . HTN (hypertension)  . History of lung cancer  . Hypertriglyceridemia  . CAD (coronary artery disease)  . Atrial myxoma  . Carotid artery disease  . Hearing loss  . Numbness and tingling of left arm and leg  . Diabetes mellitus   Past Medical History:  Past Medical History  Diagnosis Date  . HTN (hypertension)   . History of lung cancer   . Hypertriglyceridemia   . CAD (coronary artery disease)   . Atrial myxoma     Left  . Carotid artery disease   . Hearing loss   . Diabetes mellitus    Past Surgical History:  Past Surgical History  Procedure Date  . Cabg x 1 with reverse saphenous vein graft to distal rt coronary artery and excision of left atrial myxoma 07/12/2005    Dr Tyrone Sage  . Left pneumonectomy 05/03/1998    Dr Tyrone Sage  . Combined mediastinoscopy and bronchoscopy 05/01/1998    Dr Tyrone Sage  . Rt  carotid endarterectomy     Dr Hart Rochester  . Coronary artery bypass graft     SLP Assessment/Plan/Recommendation  Clinical Impression Statement: Based on today's assessment, cognitive-linguistic function appears to have returned to baseline. No acute SLP f/u indicated at this time. Please reconsult as needed.  SLP Recommendation/Assessment: Patient does not need any further Speech [facility]  [facility] MA, CCC-SLP [phone]  Ferdinand Lango Meryl 05/18/2011, 9:58 AM

## 2011-05-18 NOTE — Evaluation (Signed)
Physical Therapy Evaluation Patient Details Name: Calvin [name] MRN: [id number] DOB: Sep 23, 1932 Today's Date: 05/18/2011  Problem List:  Patient Active Problem List  Diagnoses  . HTN (hypertension)  . History of lung cancer  . Hypertriglyceridemia  . CAD (coronary artery disease)  . Atrial myxoma  . Carotid artery disease  . Hearing loss  . Numbness and tingling of left arm and leg  . Diabetes mellitus    Past Medical History:  Past Medical History  Diagnosis Date  . HTN (hypertension)   . History of lung cancer   . Hypertriglyceridemia   . CAD (coronary artery disease)   . Atrial myxoma     Left  . Carotid artery disease   . Hearing loss   . Diabetes mellitus    Past Surgical History:  Past Surgical History  Procedure Date  . Cabg x 1 with reverse saphenous vein graft to distal rt coronary artery and excision of left atrial myxoma 07/12/2005    Dr Tyrone Sage  . Left pneumonectomy 05/03/1998    Dr Tyrone Sage  . Combined mediastinoscopy and bronchoscopy 05/01/1998    Dr Tyrone Sage  . Rt  carotid endarterectomy     Dr Hart Rochester  . Coronary artery bypass graft     PT Assessment/Plan/Recommendation PT Assessment Clinical Impression Statement: Pt s/p R CVA. Pt presented today with WNL strength. He was able to complete all functional activities and transfers with supervision/independently. He is at low risk for falling as evident of scoring a 23/24 on the DGI and being able to tandum stand for 30 sec without LOB. Pt is able to ambulate without any assistive device but was educated on using a walker if he feels unsteady when he first goes home. Pt states he is SOB after activity but his SPO2 never went under 92%. PT does not recommend any futher PT services at this time. Pt lives at home with wife and feels that he will do fine at home, PT recommends D/C to home with no further follow up PT.  PT Recommendation/Assessment: Patent does not need any further PT services No Skilled PT:  Patient at baseline level of functioning;Patient is independent with all acitivity/mobility;All education completed PT Recommendation Follow Up Recommendations: No PT follow up Equipment Recommended: None recommended by PT (has RW at home if needed) PT Goals  n/a  PT Evaluation Precautions/Restrictions  none Prior Functioning  Home Living Lives With: Spouse (wife had knee surgery 3 months ago with post op PE) Receives Help From: Family Type of Home: House Home Layout: One level Home Access: Stairs to enter Entrance Stairs-Rails: None Secretary/administrator of Steps: 1 Bathroom Shower/Tub: Health visitor: Handicapped height Bathroom Accessibility: Yes How Accessible: Accessible via walker Home Adaptive Equipment: Walker - rolling;Straight cane Prior Function Level of Independence: Independent with basic ADLs;Independent with homemaking with ambulation;Independent with transfers;Independent with gait Driving: Yes Vocation: Retired Producer, television/film/video: Awake/alert Overall Cognitive Status: Appears within functional limits for tasks assessed Orientation Level: Oriented X4 Sensation/Coordination Sensation Light Touch: Appears Intact Stereognosis: Not tested Hot/Cold: Not tested Proprioception: Not tested Coordination Gross Motor Movements are Fluid and Coordinated: Yes Fine Motor Movements are Fluid and Coordinated: Yes Extremity Assessment RUE Assessment RUE Assessment: Within Functional Limits LUE Assessment LUE Assessment: Within Functional Limits RLE Assessment RLE Assessment: Within Functional Limits LLE Assessment LLE Assessment: Within Functional Limits Mobility (including Balance) Bed Mobility Bed Mobility: Yes Supine to Sit: 7: Independent Sitting - Scoot to Edge of Bed: 7: Independent Transfers Transfers: Yes  Sit to Stand: 7: Independent Stand to Sit: 7: Independent Ambulation/Gait Ambulation/Gait: Yes Ambulation/Gait  Assistance: 5: Supervision Ambulation Distance (Feet): 200 Feet Assistive device: None Gait Pattern: Within Functional Limits Stairs: No Wheelchair Mobility Wheelchair Mobility: No  Posture/Postural Control Posture/Postural Control: No significant limitations Balance Balance Assessed: Yes Static Sitting Balance Static Sitting - Balance Support: Feet unsupported;No upper extremity supported Static Sitting - Level of Assistance: 7: Independent Static Standing Balance Static Standing - Balance Support: No upper extremity supported Static Standing - Level of Assistance: 7: Independent Dynamic Standing Balance Dynamic Standing - Balance Support: No upper extremity supported;During functional activity Dynamic Standing - Level of Assistance: 5: Stand by assistance Dynamic Standing - Balance Activities: Other (comment) Dynamic Standing - Comments: tandum standing, standing with feet together, eyes closed  Dynamic Gait Index Level Surface: Normal Change in Gait Speed: Normal Gait with Horizontal Head Turns: Normal Gait with Vertical Head Turns: Normal Gait and Pivot Turn: Mild Impairment Step Over Obstacle: Normal Step Around Obstacles: Normal Steps: Normal Total Score: 23  End of Session PT - End of Session Equipment Utilized During Treatment: Gait belt Activity Tolerance: Patient tolerated treatment well Patient left: in chair;with call bell in reach General Behavior During Session: [facility] for tasks performed Cognition: [facility] for tasks performed  Elvera Bicker 05/18/2011, 11:23 AM

## 2011-05-18 NOTE — Progress Notes (Signed)
OT NOTE:  OT consult received and appreciated. Per P.T. And pt., pt. Functioning at baseline and with no acute OT needs currently. Will sign off acutely. Thanks!  Cassandria Anger, OTR/L Pager: [phone] 05/18/2011 .

## 2011-05-19 LAB — GLUCOSE, CAPILLARY: Glucose-Capillary: 115 mg/dL — ABNORMAL HIGH (ref 70–99)

## 2011-05-19 MED ORDER — ROSUVASTATIN CALCIUM 10 MG PO TABS: 10.0000 mg | ORAL_TABLET | Freq: Every day | ORAL | Status: DC

## 2011-05-19 MED ORDER — CLOPIDOGREL BISULFATE 75 MG PO TABS: 75.0000 mg | ORAL_TABLET | Freq: Every day | ORAL | Status: AC

## 2011-05-19 NOTE — Progress Notes (Signed)
Stroke Team Progress Note  HISTORY Calvin [name] is an 78 y.o. male who reports that he awakened normal 1/125/13. Was coming from an appointment with his wife and noted numbness in his LLE. By the time he reached home his left face and left upper extremity was numb as well. EMS was called and patient was brought in as a code stroke. Initial NIHSS was 1. After presentation symptoms resolved with only some mild facial asymmetry as residual.  LSN: [id number]  tPA Given: No: Resolution of symptoms  mRankin: 0 NIH- Interval: Baseline (01/25 1149) Level of Consciousness (1a. ): Alert, keenly responsive (01/25 2000) LOC Questions (1b. ): Answers both questions correctly (01/25 2000) LOC Commands (1c. ): Performs both tasks correctly (01/25 2000) Best Gaze (2. ): Normal (01/25 2000) Visual (3. ): No visual loss (01/25 2000) Facial Palsy (4. ): Normal symmetrical movements (01/25 2000) Motor Arm, Left (5a. ): No drift (01/25 2000) Motor Arm, Right (5b. ): No drift (01/25 2000) Motor Leg, Left (6a. ): No drift (01/25 2000) Motor Leg, Right (6b. ): No drift (01/25 2000) Limb Ataxia (7. ): Absent (01/25 2000) Sensory (8. ): Normal, no sensory loss (01/25 2000) Best Language (9. ): Mild-to-moderate aphasia (01/25 2000) Dysarthria (10. ): Mild-to-moderate dysarthria, patient slurs at least some words and, at worst, can be understood with some difficulty (01/25 2000) Inattention/Extinction: No Abnormality (01/25 2000) Total: 2  (01/25 2000)  SUBJECTIVE Patient says he feels good this am.  Getting ready for breakfast.  No weakness.     OBJECTIVE Most recent Vital Signs: Temp: 97.7 F (36.5 C) (01/27 0602) Temp src: Oral (01/27 0602) BP: 129/74 mmHg (01/27 0602) Pulse Rate: 68  (01/27 0602) Respiratory Rate: 20 O2 Saturation: 91%  CBG (last 3)  Basename 05/19/11 0703 05/18/11 2152 05/18/11 1637  GLUCAP 127* 120* 113*   Intake/Output from previous day: 01/26 0701 - 01/27 0700 In: 1321  [P.O.:1321] Out: 1175 [Urine:1175]  Current Meds: . aspirin  325 mg Oral Daily  . enoxaparin  40 mg Subcutaneous Q24H  . famotidine  20 mg Oral BID  . felodipine  10 mg Oral Daily  . insulin aspart  0-15 Units Subcutaneous TID WC  . insulin aspart  0-5 Units Subcutaneous QHS  . loratadine  10 mg Oral Daily  .  morphine injection  2 mg Intravenous Once  . multivitamins ther. w/minerals  1 tablet Oral Daily  . nebivolol  10 mg Oral Daily  . omega-3 acid ethyl esters  1 g Oral Daily  PRN acetaminophen, ondansetron (ZOFRAN) IV, senna-docusate  Diet:  Carb Control, thin liquids Activity:  Up with assistance DVT Prophylaxis:  lovenox  Mental Status: Alert, oriented, thought content appropriate.  Speech fluent with mild dysarthria, no aphasia. Able to follow 3 step commands without difficulty. Cranial Nerves: II- Visual fields grossly intact. III/IV/VI-Extraocular movements intact.  Pupils reactive bilaterally. V/VII-flattened R NLF, VIII-grossly intact IX/X-normal gag XI-bilateral shoulder shrug XII-midline tongue extension Motor: 5/5 bilaterally with normal tone and bulk. Sensory:  light touch intact throughout, bilaterally.   Deep Tendon Reflexes: 2+ UEs/ 1+ LEs Plantars: Downgoing bilaterally  Significant Diagnostic Studies: CBC    Component Value Date/Time   WBC 10.8* 05/17/2011 1612   RBC 4.55 05/17/2011 1612   HGB 14.3 05/17/2011 1612   HCT 41.3 05/17/2011 1612   PLT 207 05/17/2011 1612   MCV 90.8 05/17/2011 1612   MCH 31.4 05/17/2011 1612   MCHC 34.6 05/17/2011 1612   RDW 12.9 05/17/2011 1612  LYMPHSABS 1.8 05/17/2011 1125   MONOABS 0.8 05/17/2011 1125   EOSABS 0.3 05/17/2011 1125   BASOSABS 0.0 05/17/2011 1125   CMP    Component Value Date/Time   NA 134* 05/17/2011 1137   K 3.9 05/17/2011 1137   CL 98 05/17/2011 1137   CO2 32 05/17/2011 1125   GLUCOSE 128* 05/17/2011 1137   BUN 19 05/17/2011 1137   CREATININE 0.88 05/17/2011 1612   CALCIUM 10.1 05/17/2011 1125   PROT  6.8 05/17/2011 1125   ALBUMIN 3.8 05/17/2011 1125   AST 17 05/17/2011 1125   ALT 16 05/17/2011 1125   ALKPHOS 70 05/17/2011 1125   BILITOT 0.4 05/17/2011 1125   GFRNONAA 81* 05/17/2011 1612   GFRAA >90 05/17/2011 1612   COAGS Lab Results  Component Value Date   INR 0.94 05/17/2011   Lipid Panel    Component Value Date/Time   CHOL 238* 05/18/2011 0500   TRIG 291* 05/18/2011 0500   HDL 36* 05/18/2011 0500   CHOLHDL 6.6 05/18/2011 0500   VLDL 58* 05/18/2011 0500   LDLCALC 144* 05/18/2011 0500   HgbA1C  Lab Results  Component Value Date   HGBA1C 6.2* 05/17/2011    05/17/2011   CT HEAD WITHOUT CONTRAST  Findings: No intracranial hemorrhage.  Small vessel disease type changes. No CT evidence of large acute infarct.  Small acute infarct cannot be excluded by CT.  Global atrophy without hydrocephalus.  No intracranial mass lesion detected on this unenhanced exam.  Vascular calcifications.  Right middle cerebral artery in the region of the sylvian fissure appears slightly dense but without change.  IMPRESSION: No intracranial hemorrhage or CT evidence of large acute infarct.  Small vessel disease type changes. Fuller Canada, M.D.    05/17/11 Carotid Dopplers:  Preliminary report: Right: No evidence of hemodynamically significant internal carotid artery stenosis. CEA patent Left: 60-79% internal carotid artery stenosis. Bilateral: Vertebral artery flow is antegrade.   05/18/2011  MRI HEAD WITHOUT CONTRAST    Findings: Tiny lipoma at the tentorial incisura re-identified, unchanged, and inconsequential.  No restricted diffusion to suggest acute infarction.  No midline shift, mass effect, other intracranial mass lesion, ventriculomegaly, extra-axial collection or acute intracranial hemorrhage.  Cervicomedullary junction and pituitary are within normal limits.  Major intracranial vascular flow voids are stable, with dominant distal right vertebral artery.  Scattered mild for age T2 and FLAIR hyperintensity in the  cerebral white matter, mostly subcortical, is stable.  Superimposed dilated perivascular spaces.  Negative brain stem and cerebellum.  Negative for age visualized cervical spine; chronic ligamentous hypertrophy about the odontoid.  Minor paranasal sinus mucosal thickening. Visualized orbit soft tissues are within normal limits.  Mastoids are clear.  Negative scalp soft tissues.  IMPRESSION: 1. No acute intracranial abnormality. 2.  Mild for age nonspecific white matter changes .H.LEE HALL III, M.D.   05/18/11 MRA HEAD WITHOUT CONTRAST  Findings: Antegrade flow in the posterior circulation primarily supplied by the dominant distal right vertebral artery.  Right PICA is patent.  Left PICA might functionally terminating PICA.  No right vertebral or basilar artery stenosis.  Superior cerebellar arteries and right PCA origin are normal.  Fetal type left PCA origin.  Right posterior communicating artery is present. Bilateral PCA branches are stable; there is a long segment stenosis of the left PCA P2 segment with preserved distal flow.  Antegrade flow in both ICA siphon.  Mild ICA irregularity and no stenosis.  Ophthalmic and posterior communicating artery origins are within normal limits.  Carotid termini are patent.  MCA origins are normal.  The left ACA A1 segment is dominant and stable.  The anterior communicating artery and visualized ACA branches are within normal limits.  The visualized bilateral MCA branches are stable within normal limits.  IMPRESSION: 1.  Stable left PCA P2 segment stenosis with preserved distal PCA flow. 2.  Otherwise negative for age intracranial MRA.   H.LEE HALL III, M.D  Therapies: ST: no need identified. PT: pending  ASSESSMENT Calvin [name] is a 78 y.o. male with   1. Probable R TIA, L sided weakness resolved.  MRI/MRA Head neg for acute or subacute infarction.  2. Hyperlipidemia- not on therapy.  LDL 144, goal <80  Has had myalgia with statins in past.    3. DM2- A1C  controlled.  Being covered with SSI, on metformin at home.  4. CAD- asymp.  5. HTN- controlled.  Stroke risk factors: HTN, Hyperlipidemia, DM, CAD,  obesity Hospital day # 3  TREATMENT/PLAN 1. Plavix 75 mg PO Q am for secondary stroke prevention.  D/C asa 2. Crestor 10 mg PO QD.- if myalgias develop, consider pravachol and zetia or Welchol. 3. Neurology F/U 8 weeks. OK for discharge today. Dr. Hart Rochester is following the left carotid stenosis with serial doppler studies.  No further neurologic intervention is recommended at this time.  If further questions arise, please call or page at that time.  Thank you for allowing neurology to participate in the care of this patient.   Marya Fossa PA-C [facility] [phone] 05/19/2011  9:02 AM   Pt seen and examined by Dr. Anne Hahn who agrees with above.              Marland Kitchen

## 2011-05-19 NOTE — Discharge Summary (Signed)
Patient ID: Calvin [name] MRN: [id number] DOB/AGE: March 04, 1933 78 y.o.  Admit date: 05/17/2011 Discharge date: 05/19/2011  Primary Care Physician:  Daisy Floro, MD, MD   Discharge Diagnoses:    Present on Admission:  TIA  .Numbness and tingling of left arm and leg .HTN (hypertension) .Diabetes mellitus Mild hyponatremia  Medication List  As of 05/19/2011  1:15 PM   STOP taking these medications         aspirin 325 MG EC tablet      losartan-hydrochlorothiazide 100-25 MG per tablet         TAKE these medications         acetaminophen 325 MG tablet   Commonly known as: TYLENOL   Take 650 mg by mouth every 6 (six) hours as needed. For pain      albuterol-ipratropium 18-103 MCG/ACT inhaler   Commonly known as: COMBIVENT   Inhale 2 puffs into the lungs every 6 (six) hours as needed. For shortness of breath      cetirizine 10 MG tablet   Commonly known as: ZYRTEC   Take 10 mg by mouth daily as needed. For allergies      clopidogrel 75 MG tablet   Commonly known as: PLAVIX   Take 1 tablet (75 mg total) by mouth daily with breakfast.      felodipine 10 MG 24 hr tablet   Commonly known as: PLENDIL   Take 10 mg by mouth daily.      Fish Oil 1200 MG Caps   Take 1 capsule by mouth 2 (two) times daily.      metFORMIN 500 MG 24 hr tablet   Commonly known as: GLUCOPHAGE-XR   Take 500 mg by mouth daily with breakfast.      multivitamins ther. w/minerals Tabs   Take 1 tablet by mouth daily.      nebivolol 10 MG tablet   Commonly known as: BYSTOLIC   Take 10 mg by mouth daily.      ranitidine 150 MG tablet   Commonly known as: ZANTAC   Take 150 mg by mouth 2 (two) times daily as needed. For indigestion      rosuvastatin 10 MG tablet   Commonly known as: CRESTOR   Take 1 tablet (10 mg total) by mouth daily at 6 PM.             Consults:  Neurology   Significant Diagnostic Studies:  Dg Chest 2 View  05/09/2011  *RADIOLOGY REPORT*  Clinical Data: History  of left lung cancer 13 years ago  CHEST - 2 VIEW  Comparison: November 01, 2010  Findings: Opacification, volume loss, and pleural calcification, consistent with prior pneumonectomy, are again noted on the left and unchanged.  The right lung remains clear.  Degenerative changes appear stable within the mid thoracic spine.  IMPRESSION: Stable chest x-ray with evidence of prior left pneumonectomy.  Original Report Authenticated By: Brandon Melnick, M.D.   Ct Head Wo Contrast  05/17/2011  *RADIOLOGY REPORT*  Clinical Data: Sudden onset of left-sided weakness.  CT HEAD WITHOUT CONTRAST  Technique:  Contiguous axial images were obtained from the base of the skull through the vertex without contrast.  Comparison: 11/22/2006 CT.  01/03/2011 MR.  Findings: No intracranial hemorrhage.  Small vessel disease type changes. No CT evidence of large acute infarct.  Small acute infarct cannot be excluded by CT.  Global atrophy without hydrocephalus.  No intracranial mass lesion detected on this unenhanced exam.  Vascular calcifications.  Right middle cerebral artery in the region of the sylvian fissure appears slightly dense but without change.  IMPRESSION: No intracranial hemorrhage or CT evidence of large acute infarct.  Small vessel disease type changes.  Critical Value/emergent results were called by telephone at the time of interpretation on 05/17/2011  at 11:39 am  to  Dr. Thad Ranger, who verbally acknowledged these results.  Original Report Authenticated By: Fuller Canada, M.D.   Mr Brain Wo Contrast  05/18/2011  *RADIOLOGY REPORT*  Clinical Data:  78 year old male with left face and extremity numbness plus headache.  Stroke.  Comparison: Head CT 05/17/2011.  Brain MRI and MRA 01/03/2011.  MRI HEAD WITHOUT CONTRAST  Technique: Multiplanar, multiecho pulse sequences of the brain and surrounding structures were obtained according to standard protocol without intravenous contrast.  Findings: Tiny lipoma at the tentorial  incisura re-identified, unchanged, and inconsequential.  No restricted diffusion to suggest acute infarction.  No midline shift, mass effect, other intracranial mass lesion, ventriculomegaly, extra-axial collection or acute intracranial hemorrhage.  Cervicomedullary junction and pituitary are within normal limits.  Major intracranial vascular flow voids are stable, with dominant distal right vertebral artery.  Scattered mild for age T2 and FLAIR hyperintensity in the cerebral white matter, mostly subcortical, is stable.  Superimposed dilated perivascular spaces.  Negative brain stem and cerebellum.  Negative for age visualized cervical spine; chronic ligamentous hypertrophy about the odontoid.  Minor paranasal sinus mucosal thickening. Visualized orbit soft tissues are within normal limits.  Mastoids are clear.  Negative scalp soft tissues.  IMPRESSION: 1. No acute intracranial abnormality. 2.  Mild for age nonspecific white matter changes. 3.  MRA findings are below.  MRA HEAD WITHOUT CONTRAST  Technique: Angiographic images of the Circle of Willis were obtained using MRA technique without  intravenous contrast.  Findings: Antegrade flow in the posterior circulation primarily supplied by the dominant distal right vertebral artery.  Right PICA is patent.  Left PICA might functionally terminating PICA.  No right vertebral or basilar artery stenosis.  Superior cerebellar arteries and right PCA origin are normal.  Fetal type left PCA origin.  Right posterior communicating artery is present. Bilateral PCA branches are stable; there is a long segment stenosis of the left PCA P2 segment with preserved distal flow.  Antegrade flow in both ICA siphon.  Mild ICA irregularity and no stenosis.  Ophthalmic and posterior communicating artery origins are within normal limits.  Carotid termini are patent.  MCA origins are normal.  The left ACA A1 segment is dominant and stable.  The anterior communicating artery and visualized ACA  branches are within normal limits.  The visualized bilateral MCA branches are stable within normal limits.  IMPRESSION: 1.  Stable left PCA P2 segment stenosis with preserved distal PCA flow. 2.  Otherwise negative for age intracranial MRA.  Original Report Authenticated By: Harley Hallmark, M.D.   Mr Maxine Glenn Head/brain Wo Cm  05/18/2011  *RADIOLOGY REPORT*  Clinical Data:  78 year old male with left face and extremity numbness plus headache.  Stroke.  Comparison: Head CT 05/17/2011.  Brain MRI and MRA 01/03/2011.  MRI HEAD WITHOUT CONTRAST  Technique: Multiplanar, multiecho pulse sequences of the brain and surrounding structures were obtained according to standard protocol without intravenous contrast.  Findings: Tiny lipoma at the tentorial incisura re-identified, unchanged, and inconsequential.  No restricted diffusion to suggest acute infarction.  No midline shift, mass effect, other intracranial mass lesion, ventriculomegaly, extra-axial collection or acute intracranial hemorrhage.  Cervicomedullary junction and pituitary are within  normal limits.  Major intracranial vascular flow voids are stable, with dominant distal right vertebral artery.  Scattered mild for age T2 and FLAIR hyperintensity in the cerebral white matter, mostly subcortical, is stable.  Superimposed dilated perivascular spaces.  Negative brain stem and cerebellum.  Negative for age visualized cervical spine; chronic ligamentous hypertrophy about the odontoid.  Minor paranasal sinus mucosal thickening. Visualized orbit soft tissues are within normal limits.  Mastoids are clear.  Negative scalp soft tissues.  IMPRESSION: 1. No acute intracranial abnormality. 2.  Mild for age nonspecific white matter changes. 3.  MRA findings are below.  MRA HEAD WITHOUT CONTRAST  Technique: Angiographic images of the Circle of Willis were obtained using MRA technique without  intravenous contrast.  Findings: Antegrade flow in the posterior circulation primarily  supplied by the dominant distal right vertebral artery.  Right PICA is patent.  Left PICA might functionally terminating PICA.  No right vertebral or basilar artery stenosis.  Superior cerebellar arteries and right PCA origin are normal.  Fetal type left PCA origin.  Right posterior communicating artery is present. Bilateral PCA branches are stable; there is a long segment stenosis of the left PCA P2 segment with preserved distal flow.  Antegrade flow in both ICA siphon.  Mild ICA irregularity and no stenosis.  Ophthalmic and posterior communicating artery origins are within normal limits.  Carotid termini are patent.  MCA origins are normal.  The left ACA A1 segment is dominant and stable.  The anterior communicating artery and visualized ACA branches are within normal limits.  The visualized bilateral MCA branches are stable within normal limits.  IMPRESSION: 1.  Stable left PCA P2 segment stenosis with preserved distal PCA flow. 2.  Otherwise negative for age intracranial MRA.  Original Report Authenticated By: Harley Hallmark, M.D.    Brief H and P: For complete details please refer to admission H and P, but in brief  This is a 78 years old man who was brought him to the ER today with chief complaint of left sided and left facial numbness started about 9:45 AM. Associated with headache however he denies any motor weakness, slurring of speech or vision complaints. In the ER or the stroke was initiated, patient was seen by neurology service, CT head showed no acute findings and patient's symptoms started to improve so Code stroke was cancelled . While in the ED his symptoms recurred again however then improved. Patient was given aspirin and we were asked to admit for further management.      Hospital Course:  Principal Problem:  *Numbness and tingling of left arm and leg Resolved, was probably secondary to TIA. workup was negative for acute or subacute stroke as above. Patient was seen by neurology  service and he was started on Plavix and Crestor. Patient stated that he had myalgia with the statins in the past however as per neurology recommendation is to continue Crestor daily and if myalgia develop, consider pravachol and zetia or Welchol. Patient and wife instructed with the above and advised to call PCP for any concerns or if he develops any symptoms they verbalized understanding,all questions were answered and concerns were addressed  . Patient was seen by PT and OT and he was found to be at baseline was no acute need for OT or PT.  Active Problems:  HTN (hypertension) He was continued on Bystolic  and felodipine, losartan HCTZ was held during this admission because of mild hyponatremia however his systolic blood pressure stayed in  the 120s range. Patient to monitor  blood pressure home at home and follow his PCP. He would preferably need to be on ACEI  or ARB , however I will advise to avoid HCTZ. Will defer further adjustment of his antihypertensive regimen to PCP.   Diabetes mellitus: Hemagglutinin A1c 6%, continue metformin. CAD s/p CABG: Continue with plavix, crestor and nebivolol.    Subjective   patient seen and examined, denies any complaint   Filed Vitals:   05/19/11 1017  BP: 123/71  Pulse: 64  Temp: 98.3 F (36.8 C)  Resp: 20    General: Alert, awake, oriented x3, in no acute distress. Heart: Regular rate and rhythm, without murmurs, rubs, gallops. Lungs: Clear to auscultation bilaterally. Abdomen: Soft, nontender, nondistended, positive bowel sounds. Extremities: No clubbing cyanosis or edema with positive pedal pulses. Neuro: nonfocal.   Disposition and Follow-up:  His PCP in one week   with neurology in 8 weeks  cardiology and other physicians as scheduled.   Time spent on Discharge: 45 minutes    Signed: Taisia Fantini 05/19/2011, 1:15 PM

## 2011-05-20 NOTE — Progress Notes (Signed)
Utilization Review Completed.Calvin [name] T1/28/2013   

## 2011-11-06 ENCOUNTER — Encounter: Payer: Self-pay | Admitting: Neurosurgery

## 2011-11-07 ENCOUNTER — Ambulatory Visit (INDEPENDENT_AMBULATORY_CARE_PROVIDER_SITE_OTHER): Payer: Medicare Other | Admitting: Neurosurgery

## 2011-11-07 ENCOUNTER — Ambulatory Visit (INDEPENDENT_AMBULATORY_CARE_PROVIDER_SITE_OTHER): Payer: Medicare Other | Admitting: *Deleted

## 2011-11-07 ENCOUNTER — Encounter: Payer: Self-pay | Admitting: Neurosurgery

## 2011-11-07 VITALS — BP 120/68 | HR 70 | Resp 16 | Ht 70.0 in | Wt 218.0 lb

## 2011-11-07 DIAGNOSIS — I6529 Occlusion and stenosis of unspecified carotid artery: Secondary | ICD-10-CM

## 2011-11-07 DIAGNOSIS — Z48812 Encounter for surgical aftercare following surgery on the circulatory system: Secondary | ICD-10-CM

## 2011-11-07 NOTE — Progress Notes (Signed)
[facility] Carotid Office Note  CC: Annual carotid duplex Referring Physician: Hart Rochester  History of Present Illness: 78 year old male patient of Dr. Candie Chroman is status post a right CEA in March 2007. The patient reports having had a TIA on the right resulting in some left-sided paresthesias lasting less than 24 hours. The patient was hospitalized in January 2013 for this for 2 days. The patient and his wife state they are following up with [facility] regarding any symptoms or neural deficit from that TIA. The patient states he is recovered "fully". The patient currently denies any signs or symptoms of CVA, TIA, amaurosis fugax or any permanent neural deficit.  Past Medical History  Diagnosis Date  . HTN (hypertension)   . History of lung cancer   . Hypertriglyceridemia   . CAD (coronary artery disease)   . Atrial myxoma     Left  . Carotid artery disease   . Hearing loss   . Diabetes mellitus   . Arthritis     ROS: [x]  Positive   [ ]  Denies    General: [ ]  Weight loss, [ ]  Fever, [ ]  chills Neurologic: [ ]  Dizziness, [ ]  Blackouts, [ ]  Seizure [ ]  Stroke, [ ]  "Mini stroke", [ ]  Slurred speech, [ ]  Temporary blindness; [ ]  weakness in arms or legs, [ ]  Hoarseness Cardiac: [ ]  Chest pain/pressure, [ ]  Shortness of breath at rest [ ]  Shortness of breath with exertion, [ ]  Atrial fibrillation or irregular heartbeat Vascular: [ ]  Pain in legs with walking, [ ]  Pain in legs at rest, [ ]  Pain in legs at night,  [ ]  Non-healing ulcer, [ ]  Blood clot in vein/DVT,   Pulmonary: [ ]  Home oxygen, [ ]  Productive cough, [ ]  Coughing up blood, [ ]  Asthma,  [ ]  Wheezing Musculoskeletal:  [ ]  Arthritis, [ ]  Low back pain, [ ]  Joint pain Hematologic: [ ]  Easy Bruising, [ ]  Anemia; [ ]  Hepatitis Gastrointestinal: [ ]  Blood in stool, [ ]  Gastroesophageal Reflux/heartburn, [ ]  Trouble swallowing Urinary: [ ]  chronic Kidney disease, [ ]  on HD - [ ]  MWF or [ ]  TTHS, [  ] Burning with urination, [ ]  Difficulty urinating Skin: [ ]  Rashes, [ ]  Wounds Psychological: [ ]  Anxiety, [ ]  Depression   Social History History  Substance Use Topics  . Smoking status: Former Smoker    Types: Cigarettes    Quit date: 07/21/1997  . Smokeless tobacco: Never Used  . Alcohol Use: No    Family History Family History  Problem Relation Age of Onset  . Heart disease Mother   . Heart disease Sister   . Stomach cancer Father     Allergies  Allergen Reactions  . Statins Other (See Comments)    Muscle aches  . Amoxicillin Rash    Current Outpatient Prescriptions  Medication Sig Dispense Refill  . acetaminophen (TYLENOL) 325 MG tablet Take 650 mg by mouth every 6 (six) hours as needed. For pain      . albuterol-ipratropium (COMBIVENT) 18-103 MCG/ACT inhaler Inhale 2 puffs into the lungs every 6 (six) hours as needed. For shortness of breath      . cetirizine (ZYRTEC) 10 MG tablet Take 10 mg by mouth daily as needed. For allergies      . clopidogrel (PLAVIX) 75 MG tablet Take 1 tablet (75 mg total) by mouth daily with breakfast.  30 tablet  0  . EPIPEN 2-PAK 0.3 MG/0.3ML  DEVI Inject 0.3 mg into the skin as needed.      . felodipine (PLENDIL) 10 MG 24 hr tablet Take 10 mg by mouth daily.      Marland Kitchen losartan-hydrochlorothiazide (HYZAAR) 100-25 MG per tablet Take 25-100 mg by mouth daily.      . metFORMIN (GLUCOPHAGE-XR) 500 MG 24 hr tablet Take 500 mg by mouth daily with breakfast.      . Multiple Vitamins-Minerals (MULTIVITAMINS THER. W/MINERALS) TABS Take 1 tablet by mouth daily.      . nebivolol (BYSTOLIC) 10 MG tablet Take 10 mg by mouth daily.      . Omega-3 Fatty Acids (FISH OIL) 1200 MG CAPS Take 1 capsule by mouth 2 (two) times daily.      . ranitidine (ZANTAC) 150 MG tablet Take 150 mg by mouth 2 (two) times daily as needed. For indigestion      . rosuvastatin (CRESTOR) 10 MG tablet Take 1 tablet (10 mg total) by mouth daily at 6 PM.  30 tablet  0    Physical  Examination  Filed Vitals:   11/07/11 1456  BP: 120/68  Pulse: 70  Resp:     Body mass index is 31.28 kg/(m^2).  General:  WDWN in NAD Gait: Normal HEENT: WNL Eyes: Pupils equal Pulmonary: normal non-labored breathing , without Rales, rhonchi,  wheezing Cardiac: RRR, without  Murmurs, rubs or gallops; Abdomen: soft, NT, no masses Skin: no rashes, ulcers noted  Vascular Exam Pulses: 3+ radial pulses bilaterally Carotid bruits: Carotid pulses heard on the right with a mild bruit on the left Extremities without ischemic changes, no Gangrene , no cellulitis; no open wounds;  Musculoskeletal: no muscle wasting or atrophy   Neurologic: A&O X 3; Appropriate Affect ; SENSATION: normal; MOTOR FUNCTION:  moving all extremities equally. Speech is fluent/normal  Non-Invasive Vascular Imaging CAROTID DUPLEX 11/07/2011  Right ICA 0 - 19% stenosis Left ICA 60 - 79 % stenosis   ASSESSMENT/PLAN: Patient with a history of right brain TIA January 2013 and right CEA in March 2007. Currently asymptomatic. The patient will followup in 6 months for repeat carotid duplex. The patient knows the signs and symptoms of stroke and knows to report to the nearest emergency department should that occur. The patient and his wife's questions were encouraged and answered.  Lauree Chandler ANP   Clinic MD: Early

## 2011-11-08 NOTE — Addendum Note (Signed)
Addended by: Sharee Pimple on: 11/08/2011 09:09 AM   Modules accepted: Orders

## 2011-11-12 NOTE — Procedures (Unsigned)
CAROTID DUPLEX EXAM  INDICATION:  Follow up right carotid endarterectomy.  HISTORY: Diabetes:  Yes. Cardiac:  CABG. Hypertension:  Yes. Smoking:  Previous. Previous Surgery:  Right carotid endarterectomy 07/12/2005. CV History:  TIA January 2013; asymptomatic today. Amaurosis Fugax No, Paresthesias No, Hemiparesis No                                      RIGHT             LEFT Brachial systolic pressure:         150               146 Brachial Doppler waveforms:         Biphasic          Biphasic Vertebral direction of flow:        Antegrade         Antegrade DUPLEX VELOCITIES (cm/sec) CCA peak systolic                   172 (distal CCA/CE site)            118 ECA peak systolic                   172               312 ICA peak systolic                   68 (distal)       241 ICA end diastolic                   21                67 PLAQUE MORPHOLOGY:                  Heterogeneous     Calcific PLAQUE AMOUNT:                      Mild              Moderate to severe PLAQUE LOCATION:                    Bifurcation, ICA, CCAD, CE site     Bifurcation, ICA, ECA  IMPRESSION: 1. Right:  Patent carotid endarterectomy site with no significant     internal carotid artery stenosis.  Slight increase of velocity at     the distal common carotid artery/carotid endarterectomy site with     mild plaque visualized. 2. Left:  60-79% internal carotid artery stenosis, increased since     prior study of 05/17/2011. 3. Vertebral artery flow antegrade bilaterally.  ___________________________________________ Quita Skye Hart Rochester, M.D.  SS/MEDQ  D:  11/07/2011  T:  11/07/2011  Job:  [id number]

## 2012-05-11 ENCOUNTER — Other Ambulatory Visit: Payer: Self-pay | Admitting: *Deleted

## 2012-05-11 DIAGNOSIS — I10 Essential (primary) hypertension: Secondary | ICD-10-CM

## 2012-05-11 DIAGNOSIS — I2510 Atherosclerotic heart disease of native coronary artery without angina pectoris: Secondary | ICD-10-CM

## 2012-05-14 ENCOUNTER — Ambulatory Visit
Admission: RE | Admit: 2012-05-14 | Discharge: 2012-05-14 | Disposition: A | Discharge status: Home / Self Care | Payer: Medicare Other | Source: Ambulatory Visit | Attending: Cardiothoracic Surgery | Admitting: Cardiothoracic Surgery

## 2012-05-14 ENCOUNTER — Ambulatory Visit (INDEPENDENT_AMBULATORY_CARE_PROVIDER_SITE_OTHER): Payer: Medicare Other | Admitting: Cardiothoracic Surgery

## 2012-05-14 ENCOUNTER — Encounter: Payer: Self-pay | Admitting: Cardiothoracic Surgery

## 2012-05-14 VITALS — BP 131/62 | HR 80 | Resp 18 | Ht 70.0 in | Wt 220.0 lb

## 2012-05-14 DIAGNOSIS — Z85118 Personal history of other malignant neoplasm of bronchus and lung: Secondary | ICD-10-CM

## 2012-05-14 DIAGNOSIS — Z09 Encounter for follow-up examination after completed treatment for conditions other than malignant neoplasm: Secondary | ICD-10-CM

## 2012-05-14 DIAGNOSIS — I10 Essential (primary) hypertension: Secondary | ICD-10-CM

## 2012-05-14 DIAGNOSIS — I2510 Atherosclerotic heart disease of native coronary artery without angina pectoris: Secondary | ICD-10-CM

## 2012-05-14 NOTE — Progress Notes (Signed)
[address]            [place] [address]          [phone]     Calvin [name] [facility] Medical Record #[id number] Date of Birth: 07-Mar-1933  Referring: Gildardo Cranker, MD Primary Care: Daisy Floro, MD  Chief Complaint:   Follow up from Lung CA and myxoma  History of Present Illness:     Patient is a 79 year old who 14 years ago underwent left pneumonectomy for a squamous cell carcinoma. 7 years ago he underwent coronary bypass grafting and resection of atrial myxoma. He has known underlying pulmonary disease is currently somewhat limited by shortness of breath. He does use oxygen at night. . He returns today for followup visit and chest x-ray after previous pneumonectomy.    Past Medical History  Diagnosis Date  . HTN (hypertension)   . History of lung cancer   . Hypertriglyceridemia   . CAD (coronary artery disease)   . Atrial myxoma     Left  . Carotid artery disease   . Hearing loss   . Diabetes mellitus   . Arthritis      History  Smoking status  . Former Smoker  . Types: Cigarettes  . Quit date: 07/21/1997  Smokeless tobacco  . Never Used    History  Alcohol Use No     Allergies  Allergen Reactions  . Statins Other (See Comments)    Muscle aches  . Amoxicillin Rash    Current Outpatient Prescriptions  Medication Sig Dispense Refill  . acetaminophen (TYLENOL) 325 MG tablet Take 650 mg by mouth every 6 (six) hours as needed. For pain      . albuterol-ipratropium (COMBIVENT) 18-103 MCG/ACT inhaler Inhale 2 puffs into the lungs every 6 (six) hours as needed. For shortness of breath      . cetirizine (ZYRTEC) 10 MG tablet Take 10 mg by mouth daily as needed. For allergies      . cholecalciferol (VITAMIN D) 1000 UNITS tablet Take 1,000 Units by mouth daily.      . clopidogrel (PLAVIX) 75 MG tablet Take 1 tablet (75 mg total) by mouth daily with breakfast.  30 tablet  0  . EPIPEN 2-PAK 0.3 MG/0.3ML DEVI Inject 0.3  mg into the skin as needed.      . felodipine (PLENDIL) 10 MG 24 hr tablet Take 10 mg by mouth daily.      Marland Kitchen losartan-hydrochlorothiazide (HYZAAR) 100-25 MG per tablet Take 25-100 mg by mouth daily.      . metFORMIN (GLUCOPHAGE-XR) 500 MG 24 hr tablet Take 500 mg by mouth daily with breakfast.      . Multiple Vitamins-Minerals (MULTIVITAMINS THER. W/MINERALS) TABS Take 1 tablet by mouth daily.      . nebivolol (BYSTOLIC) 10 MG tablet Take 10 mg by mouth daily.      . Omega-3 Fatty Acids (FISH OIL) 1200 MG CAPS Take 1 capsule by mouth 2 (two) times daily.      . ranitidine (ZANTAC) 150 MG tablet Take 150 mg by mouth 2 (two) times daily as needed. For indigestion      . rosuvastatin (CRESTOR) 10 MG tablet Take 1 tablet (10 mg total) by mouth daily at 6 PM.  30 tablet  0       Physical Exam: BP 131/62  Pulse 80  Resp 18  Ht  5\' 10"  (1.778 m)  Wt 220 lb (99.791 kg)  BMI 31.57 kg/m2  SpO2 90%  General appearance: alert, cooperative and no distress Neurologic: intact Heart: regular rate and rhythm, S1, S2 normal, no murmur, click, rub or gallop Lungs: diminished breath sounds left chest Abdomen: soft, non-tender; bowel sounds normal; no masses,  no organomegaly Extremities: extremities normal, atraumatic, no cyanosis or edema, no edema, redness or tenderness in the calves or thighs and no ulcers, gangrene or trophic changes Patient has no cervical supraclavicular adenopathy  Diagnostic Studies & Laboratory data:     Recent Radiology Findings:   Dg Chest 2 View  05/14/2012  *RADIOLOGY REPORT*  Clinical Data: Shortness of breath, history prior cardiac and the lung surgery  CHEST - 2 VIEW  Comparison: Chest x-ray of 05/09/2011  Findings: The left hemithorax is completely opacified consistent with prior left pneumonectomy with some calcification throughout the left hemithorax.  The right lung is hyperaerated.  Heart size is difficult to assess with mediastinal shift toward the left. Median  sternotomy sutures are noted.  No acute skeletal abnormality is seen.  IMPRESSION: Stable chest x-ray with left fibrothorax.  The right lung remains hyperaerated.   Original Report Authenticated By: Dwyane Dee, M.D.       Recent Lab Findings: Lab Results  Component Value Date   WBC 10.8* 05/17/2011   HGB 14.3 05/17/2011   HCT 41.3 05/17/2011   PLT 207 05/17/2011   GLUCOSE 128* 05/17/2011   CHOL 238* 05/18/2011   TRIG 291* 05/18/2011   HDL 36* 05/18/2011   LDLCALC 144* 05/18/2011   ALT 16 05/17/2011   AST 17 05/17/2011   NA 134* 05/17/2011   K 3.9 05/17/2011   CL 98 05/17/2011   CREATININE 0.88 05/17/2011   BUN 19 05/17/2011   CO2 32 05/17/2011   TSH 1.396 01/19/2010   INR 0.94 05/17/2011   HGBA1C 6.0* 05/18/2011   ECHO last year: [facility] *[facility]* [address] [place], [place] [address] [phone]  ------------------------------------------------------------ Echocardiography  Patient: Calvin [name], Calvin [name] MR #: [id number] Study Date: 05/18/2011 Gender: M Age: 39 Height: 180.3cm Weight: 95kg BSA: 2.38m^2 Pt. Status: Room: 3023  PERFORMING [facility] Cardiology, Ec SONOGRAPHER Silvano Bilis, RCS ORDERING [place], [place] ADMITTING [place], [place] ATTENDING [place], [place] cc:  ------------------------------------------------------------ LV EF: 55% - 60%  ------------------------------------------------------------ Indications: CVA 436.  ------------------------------------------------------------ History: PMH: Coronary artery disease. Risk factors: Former tobacco use. Hypertension.  ------------------------------------------------------------ Study Conclusions  - Left ventricle: The cavity size was normal. Systolic function was normal. The estimated ejection fraction was in the range of 55% to 60%. - Left atrium: There is no evidence of left atrial myxoma. Impressions:  - No cardiac source of emboli was indentified. Recommendations: Consider  transesophageal echocardiography if clinically indicated. Echocardiography. M-mode, complete 2D, spectral Doppler, and color Doppler. Height: Height: 180.3cm. Height: 71in. Weight: Weight: 95kg. Weight: 209lb. Body mass index: BMI: 29.2kg/m^2. Body surface area: BSA: 2.16m^2. Blood pressure: 136/73. Patient status: Inpatient. Location: Bedside.  ------------------------------------------------------------  ------------------------------------------------------------ Left ventricle: The cavity size was normal. Systolic function was normal. The estimated ejection fraction was in the range of 55% to 60%. Septal bounce was present but there was no Doppler evidence of restrictive or constrictive physiology.  ------------------------------------------------------------ Aortic valve: Mildly thickened, mildly calcified leaflets. Cusp separation was normal. Doppler: Transvalvular velocity was within the normal range. There was no stenosis. No regurgitation.  ------------------------------------------------------------ Aorta: Ascending aorta: The ascending aorta was mildly dilated.  ------------------------------------------------------------ Mitral valve: Structurally  normal valve. Leaflet separation was normal. Doppler: Transvalvular velocity was within the normal range. There was no evidence for stenosis. Trivial regurgitation. Peak gradient: 5mm Hg (D).  ------------------------------------------------------------ Left atrium: The atrium was normal in size. There is no evidence of left atrial myxoma.  ------------------------------------------------------------ Right ventricle: The cavity size was normal. Wall thickness was normal. Systolic function was normal.  ------------------------------------------------------------ Pulmonic valve: Structurally normal valve. Cusp separation was normal. Doppler: Transvalvular velocity was within the normal range. Trivial  regurgitation.  ------------------------------------------------------------ Tricuspid valve: Structurally normal valve. Leaflet separation was normal. Doppler: Transvalvular velocity was within the normal range. No regurgitation.  ------------------------------------------------------------ Pulmonary artery: The main pulmonary artery was normal-sized.  ------------------------------------------------------------ Right atrium: The atrium was normal in size.  ------------------------------------------------------------ Pericardium: The pericardium was normal in appearance. There was no pericardial effusion.  ------------------------------------------------------------ Systemic veins: Inferior vena cava: The vessel was normal in size; the respirophasic diameter changes were in the normal range (= 50%); findings are consistent with normal central venous pressure.  ------------------------------------------------------------  2D measurements Normal Doppler Normal Left ventricle measurements LVID ED, 44.6 mm 43-52 Left ventricle chord, Ea, lat 9.65 cm/ ------- PLAX ann, tiss s LVID ES, 31.1 mm 23-38 DP chord, E/Ea, lat 11.5 ------- PLAX ann, tiss FS, chord, 30 % >29 DP PLAX Ea, med 10.1 cm/ ------- LVPW, ED 11.1 mm ------ ann, tiss s IVS/LVPW 1.05 <1.3 DP ratio, ED E/Ea, med 10.99 ------- Ventricular septum ann, tiss IVS, ED 11.7 mm ------ DP Aorta Mitral valve Root diam, 42 mm ------ Peak E vel 111 cm/ ------- ED s Left atrium Peak A vel 137 cm/ ------- AP dim 38 mm ------ s AP dim 1.73 cm/m^2 <2.2 Deceleratio 232 ms 150-230 index n time Peak 5 mm ------- gradient, D Hg Peak E/A 0.8 ------- ratio  ------------------------------------------------------------ Prepared and Electronically Authenticated by  Donato Schultz, MD 2013-01-26T20:10:42.753   Assessment / Plan:      Patient is stable now 13 years after left pneumonectomy for 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 No evidence of recurrance Atrial Myxoma resected in 2007 no evidence of recurrence on echo last year  His functional status with home oxygen at night appear stable Unable to take statins, he notes cardiology has been working on this with elevated cholesterol and LDL Plan see the patient back in one year   Delight Ovens MD  Beeper [phone] Office [phone] 05/14/2012 10:31 AM

## 2012-06-19 ENCOUNTER — Emergency Department (HOSPITAL_COMMUNITY)
Admission: EM | Admit: 2012-06-19 | Discharge: 2012-06-19 | Disposition: A | Discharge status: Home / Self Care | Payer: Medicare Other | Attending: Emergency Medicine | Admitting: Emergency Medicine

## 2012-06-19 ENCOUNTER — Emergency Department (HOSPITAL_COMMUNITY): Payer: Medicare Other

## 2012-06-19 ENCOUNTER — Encounter (HOSPITAL_COMMUNITY): Payer: Self-pay | Admitting: Neurology

## 2012-06-19 DIAGNOSIS — Z79899 Other long term (current) drug therapy: Secondary | ICD-10-CM | POA: Insufficient documentation

## 2012-06-19 DIAGNOSIS — R000 Tachycardia, unspecified: Secondary | ICD-10-CM | POA: Insufficient documentation

## 2012-06-19 DIAGNOSIS — Z951 Presence of aortocoronary bypass graft: Secondary | ICD-10-CM | POA: Insufficient documentation

## 2012-06-19 DIAGNOSIS — E119 Type 2 diabetes mellitus without complications: Secondary | ICD-10-CM | POA: Insufficient documentation

## 2012-06-19 DIAGNOSIS — Z7902 Long term (current) use of antithrombotics/antiplatelets: Secondary | ICD-10-CM | POA: Insufficient documentation

## 2012-06-19 DIAGNOSIS — Z8739 Personal history of other diseases of the musculoskeletal system and connective tissue: Secondary | ICD-10-CM | POA: Insufficient documentation

## 2012-06-19 DIAGNOSIS — Z87891 Personal history of nicotine dependence: Secondary | ICD-10-CM | POA: Insufficient documentation

## 2012-06-19 DIAGNOSIS — H9190 Unspecified hearing loss, unspecified ear: Secondary | ICD-10-CM | POA: Insufficient documentation

## 2012-06-19 DIAGNOSIS — Z8679 Personal history of other diseases of the circulatory system: Secondary | ICD-10-CM | POA: Insufficient documentation

## 2012-06-19 DIAGNOSIS — E781 Pure hyperglyceridemia: Secondary | ICD-10-CM | POA: Insufficient documentation

## 2012-06-19 DIAGNOSIS — Z85118 Personal history of other malignant neoplasm of bronchus and lung: Secondary | ICD-10-CM | POA: Insufficient documentation

## 2012-06-19 DIAGNOSIS — I10 Essential (primary) hypertension: Secondary | ICD-10-CM | POA: Insufficient documentation

## 2012-06-19 DIAGNOSIS — I2510 Atherosclerotic heart disease of native coronary artery without angina pectoris: Secondary | ICD-10-CM | POA: Insufficient documentation

## 2012-06-19 LAB — TROPONIN I: Troponin I: <0.3 ng/mL (ref <0.30)

## 2012-06-19 LAB — CBC WITH DIFFERENTIAL/PLATELET
Basophils Absolute: 0 10*3/uL (ref 0.0–0.1)
Basophils Relative: 0 % (ref 0–1)
Eosinophils Absolute: 0.2 10*3/uL (ref 0.0–0.7)
Hemoglobin: 14.2 g/dL (ref 13.0–17.0)
MCH: 32.3 pg (ref 26.0–34.0)
MCHC: 34.6 g/dL (ref 30.0–36.0)
Monocytes Relative: 9 % (ref 3–12)
Neutro Abs: 7.7 10*3/uL (ref 1.7–7.7)
Neutrophils Relative %: 78 % — ABNORMAL HIGH (ref 43–77)
RDW: 13.2 % (ref 11.5–15.5)

## 2012-06-19 LAB — BASIC METABOLIC PANEL
BUN: 14 mg/dL (ref 6–23)
Creatinine, Ser: 0.95 mg/dL (ref 0.50–1.35)
GFR calc Af Amer: >90 mL/min (ref >90)
GFR calc non Af Amer: 78 mL/min — ABNORMAL LOW (ref >90)
Potassium: 3.8 mEq/L (ref 3.5–5.1)

## 2012-06-19 MED ORDER — ASPIRIN 325 MG PO TABS: 325.0000 mg | ORAL_TABLET | Freq: Once | ORAL | Status: DC

## 2012-06-19 NOTE — ED Notes (Signed)
Pt st's after eating lunch today and taking a nap he noticed his heart was beating fast.  St's he check and it was in th 130's.  Pt denies having chest pain or other symptoms at that time.  Pt continues to denies any pain.  Skin warm and dry, color appropriate.  Family at bedside.

## 2012-06-19 NOTE — ED Notes (Signed)
Per EMS-Pt finished eating, check HR with home meter HR 135. For EMS HR around 100. During transport 98-100. Denying any pain, or sob. Skin warm and dry. BP 160/80. EKG showing SR with PVC. Pt has left lung removed from previous tumor. 20 L. Hand. Alert and oriented.

## 2012-06-19 NOTE — ED Provider Notes (Signed)
History     CSN: [id number]  Arrival date & time 06/19/12  1616   First MD Initiated Contact with Patient 06/19/12 1624      Chief Complaint  Patient presents with  . Tachycardia    HPI The patient is a 79 year old male who presents to the ED with approximately one hour of tachycardia. Patient has an oxygen requirement and was trying to put on his nasal cannula when he notices heart rate was going fast on his pulse oximeter. Reports it was going approximately 120 to 130. No chest pain.  No shortness of breath. Called EMS when EMS arrived patient still tachycardic but resolved an ambulance without intervention. No hypoxia. Patient still asymptomatic on baseline oxygen requirement.  Past Medical History  Diagnosis Date  . HTN (hypertension)   . History of lung cancer   . Hypertriglyceridemia   . CAD (coronary artery disease)   . Atrial myxoma     Left  . Carotid artery disease   . Hearing loss   . Diabetes mellitus   . Arthritis     Past Surgical History  Procedure Laterality Date  . Cabg x 1 with reverse saphenous vein graft to distal rt coronary artery and excision of left atrial myxoma  07/12/2005    Dr Tyrone Sage  . Left pneumonectomy  05/03/1998    Dr Tyrone Sage  . Combined mediastinoscopy and bronchoscopy  05/01/1998    Dr Tyrone Sage  . Rt  carotid endarterectomy      Dr Hart [place]  . Coronary artery bypass graft    . Carotid endarterectomy  2007    Right CEA  . Lung removal, partial  2000    Left lung removed     Family History  Problem Relation Age of Onset  . Heart disease Mother   . Heart disease Sister   . Stomach cancer Father     History  Substance Use Topics  . Smoking status: Former Smoker    Types: Cigarettes    Quit date: 07/21/1997  . Smokeless tobacco: Never Used  . Alcohol Use: No      Review of Systems  Constitutional: Negative for fever and chills.  HENT: Negative for congestion, sore throat and neck pain.   Respiratory: Negative for cough.    Gastrointestinal: Negative for nausea, vomiting, abdominal pain, diarrhea and constipation.  Endocrine: Negative for polyuria.  Genitourinary: Negative for dysuria and hematuria.  Skin: Negative for rash.  Neurological: Negative for headaches.  Psychiatric/Behavioral: Negative.   All other systems reviewed and are negative.    Allergies  Statins and Amoxicillin  Home Medications   Current Outpatient Rx  Name  Route  Sig  Dispense  Refill  . acetaminophen (TYLENOL) 325 MG tablet   Oral   Take 650 mg by mouth every 6 (six) hours as needed. For pain         . albuterol (PROVENTIL HFA;VENTOLIN HFA) 108 (90 BASE) MCG/ACT inhaler   Inhalation   Inhale 2 puffs into the lungs every 6 (six) hours as needed for wheezing.         . cetirizine (ZYRTEC) 10 MG tablet   Oral   Take 10 mg by mouth daily as needed. For allergies         . cholecalciferol (VITAMIN D) 1000 UNITS tablet   Oral   Take 2,000 Units by mouth 2 (two) times daily.          . clopidogrel (PLAVIX) 75 MG tablet   Oral  Take 75 mg by mouth daily.         Marland Kitchen EPIPEN 2-PAK 0.3 MG/0.3ML DEVI   Subcutaneous   Inject 0.3 mg into the skin See admin instructions. Take as needed for severe allergic reaction         . felodipine (PLENDIL) 10 MG 24 hr tablet   Oral   Take 10 mg by mouth daily.         Marland Kitchen losartan-hydrochlorothiazide (HYZAAR) 100-25 MG per tablet   Oral   Take 25-100 mg by mouth daily.         . metFORMIN (GLUCOPHAGE-XR) 500 MG 24 hr tablet   Oral   Take 500 mg by mouth daily with breakfast.         . Multiple Vitamins-Minerals (MULTIVITAMINS THER. W/MINERALS) TABS   Oral   Take 1 tablet by mouth daily.         . nebivolol (BYSTOLIC) 10 MG tablet   Oral   Take 10 mg by mouth at bedtime.          . Omega-3 Fatty Acids (FISH OIL) 1200 MG CAPS   Oral   Take 1 capsule by mouth 2 (two) times daily.           BP 168/72  Pulse 98  Temp(Src) 98.3 F (36.8 C) (Oral)  Resp  18  SpO2 95%  Physical Exam  Nursing note and vitals reviewed. Constitutional: He is oriented to person, place, and time. He appears well-developed and well-nourished. No distress.  HENT:  Head: Normocephalic and atraumatic.  Right Ear: External ear normal.  Left Ear: External ear normal.  Mouth/Throat: Oropharynx is clear and moist. No oropharyngeal exudate.  Eyes: Conjunctivae are normal. Pupils are equal, round, and reactive to light. Right eye exhibits no discharge.  Neck: Normal range of motion. Neck supple. No tracheal deviation present.  Cardiovascular: Normal rate, regular rhythm and intact distal pulses.   Pulmonary/Chest: Effort normal. No respiratory distress. He has no wheezes. He has no rales.  Abdominal: Soft. He exhibits no distension. There is no tenderness. There is no rebound and no guarding.  Musculoskeletal: Normal range of motion.  Neurological: He is alert and oriented to person, place, and time.  Skin: Skin is warm and dry. No rash noted. He is not diaphoretic.  Psychiatric: He has a normal mood and affect.    ED Course  Procedures (including critical care time)  Labs Reviewed  CBC WITH DIFFERENTIAL - Abnormal; Notable for the following:    Neutrophils Relative 78 (*)    Lymphocytes Relative 11 (*)    All other components within normal limits  BASIC METABOLIC PANEL - Abnormal; Notable for the following:    CO2 36 (*)    Glucose, Bld 173 (*)    GFR calc non Af Amer 78 (*)    All other components within normal limits  TROPONIN I   Dg Chest 2 View  06/19/2012  *RADIOLOGY REPORT*  Clinical Data: Tachycardia.  CHEST - 2 VIEW  Comparison: 05/14/2012  Findings: Complete opacification of the left hemithorax with extensive pleural calcifications. Prior left pneumonectomy.  Right lung is hyperaerated.  Heart is normal size. No acute bony abnormality.  IMPRESSION: Stable exam.  No acute cardiopulmonary disease.   Original Report Authenticated By: Charlett Nose, M.D.      Date: 06/20/2012  Rate: 101  Rhythm: normal sinus rhythm  QRS Axis: normal  Intervals: normal  ST/T Wave abnormalities: normal  Conduction Disutrbances:none  Narrative Interpretation:  Poor baseline.  Inferior q waves.  Old EKG Reviewed: unchanged    1. Tachycardia       MDM   Patient is a 80 year old male with a history coronary artery disease status post CABG as well as left pneumonectomy for lung cancer who presented to the ED with transient tachycardia. Patient with no chest pain shortness of breath or other ischemic symptoms. No tachycardia present on initial exam. A basic cardiac workup performed and negative. Exam benign. Patient remained asymptomatic and was reexamined at the end of the encounter and found to be at baseline.  Doubt serious cause of tachycardia. Patient safe for discharge home. Patient discharged.       Arloa Koh, MD 06/20/12 [id number]

## 2012-06-19 NOTE — ED Notes (Signed)
Pt returned from x-ray.  Placed back on cardiac monitor.  NSR rate 78.  No complaints voiced by pt. Family at bedside.

## 2012-06-20 NOTE — ED Provider Notes (Signed)
I saw and evaluated the patient, reviewed the resident's note and I agree with the findings and plan.  The patient presents here for evaluation of elevated heart rate.  He has a history of CAD, COPD, and is on home oxygen.  The pulse oximeter read a heart rate of 130 while at rest and this concerned him.  He denies to me any chest pain, palpitations, or worsening shortness of breath.    On exam, the vitals are stable and the patient is afebrile.  The heart is regular rate and rhythm without murmurs.  The lungs are clear and equal.  The abdomen is benign.  The extremities are without edema.  The patient presents after an episode of tachycardia that was asymptomatic and he is without complaint at present.  The workup has been unremarkable, has maintained adequate saturations, and has been without ectopy throughout his ED stay.  He appears very stable for discharge.  Geoffery Lyons, MD 06/20/12 0830

## 2012-11-06 ENCOUNTER — Ambulatory Visit: Payer: Medicare Other | Admitting: Neurosurgery

## 2012-11-06 ENCOUNTER — Other Ambulatory Visit (INDEPENDENT_AMBULATORY_CARE_PROVIDER_SITE_OTHER): Payer: Medicare Other | Admitting: *Deleted

## 2012-11-06 DIAGNOSIS — Z48812 Encounter for surgical aftercare following surgery on the circulatory system: Secondary | ICD-10-CM

## 2012-11-06 DIAGNOSIS — I6529 Occlusion and stenosis of unspecified carotid artery: Secondary | ICD-10-CM

## 2012-11-10 ENCOUNTER — Other Ambulatory Visit: Payer: Self-pay | Admitting: *Deleted

## 2012-11-10 DIAGNOSIS — Z48812 Encounter for surgical aftercare following surgery on the circulatory system: Secondary | ICD-10-CM

## 2012-11-11 ENCOUNTER — Encounter: Payer: Self-pay | Admitting: Vascular Surgery

## 2013-01-13 ENCOUNTER — Other Ambulatory Visit: Payer: Self-pay | Admitting: Interventional Cardiology

## 2013-01-13 DIAGNOSIS — Z79899 Other long term (current) drug therapy: Secondary | ICD-10-CM

## 2013-01-13 DIAGNOSIS — E785 Hyperlipidemia, unspecified: Secondary | ICD-10-CM

## 2013-03-09 ENCOUNTER — Other Ambulatory Visit: Payer: Self-pay | Admitting: Interventional Cardiology

## 2013-05-11 ENCOUNTER — Other Ambulatory Visit: Payer: Self-pay | Admitting: *Deleted

## 2013-05-11 DIAGNOSIS — C3490 Malignant neoplasm of unspecified part of unspecified bronchus or lung: Secondary | ICD-10-CM

## 2013-05-13 ENCOUNTER — Encounter: Payer: Self-pay | Admitting: Cardiothoracic Surgery

## 2013-05-13 ENCOUNTER — Ambulatory Visit
Admission: RE | Admit: 2013-05-13 | Discharge: 2013-05-13 | Disposition: A | Discharge status: Home / Self Care | Payer: Medicare Other | Source: Ambulatory Visit | Attending: Cardiothoracic Surgery | Admitting: Cardiothoracic Surgery

## 2013-05-13 ENCOUNTER — Ambulatory Visit (INDEPENDENT_AMBULATORY_CARE_PROVIDER_SITE_OTHER): Payer: Medicare Other | Admitting: Cardiothoracic Surgery

## 2013-05-13 VITALS — BP 131/61 | HR 74 | Resp 16 | Ht 70.5 in | Wt 220.0 lb

## 2013-05-13 DIAGNOSIS — Z09 Encounter for follow-up examination after completed treatment for conditions other than malignant neoplasm: Secondary | ICD-10-CM

## 2013-05-13 DIAGNOSIS — I2510 Atherosclerotic heart disease of native coronary artery without angina pectoris: Secondary | ICD-10-CM

## 2013-05-13 DIAGNOSIS — Z85118 Personal history of other malignant neoplasm of bronchus and lung: Secondary | ICD-10-CM

## 2013-05-13 DIAGNOSIS — D151 Benign neoplasm of heart: Secondary | ICD-10-CM

## 2013-05-13 DIAGNOSIS — C3490 Malignant neoplasm of unspecified part of unspecified bronchus or lung: Secondary | ICD-10-CM

## 2013-05-13 DIAGNOSIS — Z951 Presence of aortocoronary bypass graft: Secondary | ICD-10-CM

## 2013-05-13 NOTE — Progress Notes (Signed)
[facility] Record #[id number] Date of Birth: 01/02/1933  Referring: [name], MD Primary Care:  [name], MD  Chief Complaint:   Follow up from Lung CA and myxoma  History of Present Illness:     Patient is a 80 year old who 15 years ago underwent left pneumonectomy for a squamous cell carcinoma. 8 years ago he underwent coronary bypass grafting and resection of atrial myxoma. He has known underlying pulmonary disease is currently somewhat limited by shortness of breath. He continues to do yard work. He notes today he rides a long more around the yard with a long grabber to pick up and cones.  He uses oxygen continuously. He is unable to take statins because of muscle pain.  Marland Kitchen He returns today for followup visit and chest x-ray after previous pneumonectomy.    Past Medical History  Diagnosis Date  . HTN (hypertension)   . History of lung cancer   . Hypertriglyceridemia   . CAD (coronary artery disease)   . Atrial myxoma     Left  . Carotid artery disease   . Hearing loss   . Diabetes mellitus   . Arthritis      History  Smoking status  . Former Smoker  . Types: Cigarettes  . Quit date: 07/21/1997  Smokeless tobacco  . Never Used    History  Alcohol Use No     Allergies  Allergen Reactions  . Statins Other (See Comments)    Muscle aches  . Amoxicillin Rash    Current Outpatient Prescriptions  Medication Sig Dispense Refill  . acetaminophen (TYLENOL) 325 MG tablet Take 650 mg by mouth every 6 (six) hours as needed. For pain      . albuterol (PROVENTIL HFA;VENTOLIN HFA) 108 (90 BASE) MCG/ACT inhaler Inhale 2 puffs into the lungs every 6 (six) hours as needed for wheezing.      . cetirizine (ZYRTEC) 10 MG tablet Take 10 mg by mouth daily as needed. For allergies      . cholecalciferol (VITAMIN D) 1000 UNITS tablet Take 2,000 Units by mouth 2 (two) times daily.       . clopidogrel (PLAVIX) 75 MG tablet Take 75 mg by  mouth daily.      Marland Kitchen EPIPEN 2-PAK 0.3 MG/0.3ML DEVI Inject 0.3 mg into the skin See admin instructions. Take as needed for severe allergic reaction      . felodipine (PLENDIL) 10 MG 24 hr tablet Take 10 mg by mouth daily.      Marland Kitchen losartan-hydrochlorothiazide (HYZAAR) 100-25 MG per tablet TAKE 1 TABLET DAILY  90 tablet  2  . magnesium oxide (MAG-OX) 400 MG tablet Take 400 mg by mouth daily.      . Multiple Vitamins-Minerals (MULTIVITAMINS THER. W/MINERALS) TABS Take 1 tablet by mouth daily.      . nebivolol (BYSTOLIC) 10 MG tablet Take 10 mg by mouth at bedtime.       . Omega-3 Fatty Acids (FISH OIL) 1200 MG CAPS Take 1 capsule by mouth 2 (two) times daily.       No current facility-administered medications for this visit.       Physical Exam: BP 131/61  Pulse 74  Resp 16  Ht 5' 10.5" (1.791 m)  Wt 220 lb (99.791 kg)  BMI 31.11 kg/m2  SpO2 91%  General appearance: alert, cooperative and no distress Neurologic: intact Heart:  regular rate and rhythm, S1, S2 normal, no murmur, click, rub or gallop Lungs: diminished breath sounds left chest Abdomen: soft, non-tender; bowel sounds normal; no masses,  no organomegaly Extremities: extremities normal, atraumatic, no cyanosis or edema, no edema, redness or tenderness in the calves or thighs and no ulcers, gangrene or trophic changes Patient has no cervical supraclavicular adenopathy  Diagnostic Studies & Laboratory data:     Recent Radiology Findings:   Dg Chest 2 View  05/13/2013   CLINICAL DATA:  Dyspnea and history of left pneumonectomy for lung carcinoma.  EXAM: CHEST - 2 VIEW  COMPARISON:  06/19/2012  FINDINGS: Stable changes related to left pneumonectomy with dense pleural calcifications identified. The right lung shows stable expansion and no evidence of edema, consolidation, nodule, pneumothorax or pleural fluid. The heart size and mediastinal contours are stable. Stable degenerative changes are identified of the thoracic spine.   IMPRESSION: Stable chest x-ray post left pneumonectomy. No acute findings are identified.   Electronically Signed   By: Aletta Edouard M.D.   On: 05/13/2013 10:54      Recent Lab Findings: Lab Results  Component Value Date   WBC 9.8 06/19/2012   HGB 14.2 06/19/2012   HCT 41.0 06/19/2012   PLT 192 06/19/2012   GLUCOSE 173* 06/19/2012   CHOL 238* 05/18/2011   TRIG 291* 05/18/2011   HDL 36* 05/18/2011   LDLCALC 144* 05/18/2011   ALT 16 05/17/2011   AST 17 05/17/2011   NA 138 06/19/2012   K 3.8 06/19/2012   CL 96 06/19/2012   CREATININE 0.95 06/19/2012   BUN 14 06/19/2012   CO2 36* 06/19/2012   TSH 1.396 01/19/2010   INR 0.94 05/17/2011   HGBA1C 6.0* 05/18/2011   ECHO 2013: last echo [facility]* [address], [place] [address] [phone]  ------------------------------------------------------------ Echocardiography  Patient: Eustace, Hur MR #: [id number] Study Date: 05/18/2011 Gender: M Age: 80 Height: 180.3cm Weight: 95kg BSA: 2.54m^2 Pt. Status: Room: [facility], Ec SONOGRAPHER Delman Kitten, RCS ORDERING [place], [place] ADMITTING [place], [place] ATTENDING [place], [place] cc:  ------------------------------------------------------------ LV EF: 55% - 60%  ------------------------------------------------------------ Indications: CVA 48.  ------------------------------------------------------------ History: PMH: Coronary artery disease. Risk factors: Former tobacco use. Hypertension.  ------------------------------------------------------------ Study Conclusions  - Left ventricle: The cavity size was normal. Systolic function was normal. The estimated ejection fraction was in the range of 55% to 60%. - Left atrium: There is no evidence of left atrial myxoma. Impressions:  - No cardiac source of emboli was indentified. Recommendations: Consider transesophageal echocardiography if clinically  indicated. Echocardiography. M-mode, complete 2D, spectral Doppler, and color Doppler. Height: Height: 180.3cm. Height: 71in. Weight: Weight: 95kg. Weight: 209lb. Body mass index: BMI: 29.2kg/m^2. Body surface area: BSA: 2.12m^2. Blood pressure: 136/73. Patient status: Inpatient. Location: Bedside.  ------------------------------------------------------------  ------------------------------------------------------------ Left ventricle: The cavity size was normal. Systolic function was normal. The estimated ejection fraction was in the range of 55% to 60%. Septal bounce was present but there was no Doppler evidence of restrictive or constrictive physiology.  ------------------------------------------------------------ Aortic valve: Mildly thickened, mildly calcified leaflets. Cusp separation was normal. Doppler: Transvalvular velocity was within the normal range. There was no stenosis. No regurgitation.  ------------------------------------------------------------ Aorta: Ascending aorta: The ascending aorta was mildly dilated.  ------------------------------------------------------------ Mitral valve: Structurally normal valve. Leaflet separation was normal. Doppler: Transvalvular velocity was within the normal range. There was no evidence for stenosis. Trivial regurgitation. Peak gradient: 18mm Hg (D).  ------------------------------------------------------------  Left atrium: The atrium was normal in size. There is no evidence of left atrial myxoma.  ------------------------------------------------------------ Right ventricle: The cavity size was normal. Wall thickness was normal. Systolic function was normal.  ------------------------------------------------------------ Pulmonic valve: Structurally normal valve. Cusp separation was normal. Doppler: Transvalvular velocity was within the normal range. Trivial  regurgitation.  ------------------------------------------------------------ Tricuspid valve: Structurally normal valve. Leaflet separation was normal. Doppler: Transvalvular velocity was within the normal range. No regurgitation.  ------------------------------------------------------------ Pulmonary artery: The main pulmonary artery was normal-sized.  ------------------------------------------------------------ Right atrium: The atrium was normal in size.  ------------------------------------------------------------ Pericardium: The pericardium was normal in appearance. There was no pericardial effusion.  ------------------------------------------------------------ Systemic veins: Inferior vena cava: The vessel was normal in size; the respirophasic diameter changes were in the normal range (= 50%); findings are consistent with normal central venous pressure.  ------------------------------------------------------------  2D measurements Normal Doppler Normal Left ventricle measurements LVID ED, 44.6 mm 43-52 Left ventricle chord, Ea, lat 9.65 cm/ ------- PLAX ann, tiss s LVID ES, 31.1 mm 23-38 DP chord, E/Ea, lat 11.5 ------- PLAX ann, tiss FS, chord, 30 % >29 DP PLAX Ea, med 10.1 cm/ ------- LVPW, ED 11.1 mm ------ ann, tiss s IVS/LVPW 1.05 <1.3 DP ratio, ED E/Ea, med 10.99 ------- Ventricular septum ann, tiss IVS, ED 11.7 mm ------ DP Aorta Mitral valve Root diam, 42 mm ------ Peak E vel 111 cm/ ------- ED s Left atrium Peak A vel 137 cm/ ------- AP dim 38 mm ------ s AP dim 1.73 cm/m^2 <2.2 Deceleratio 232 ms 150-230 index n time Peak 5 mm ------- gradient, D Hg Peak E/A 0.8 ------- ratio  ------------------------------------------------------------ Prepared and Electronically Authenticated by  Candee Furbish, MD 2013-01-26T20:10:42.753   Assessment / Plan:      Patient is stable now 15 years after left pneumonectomy for 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 No evidence of recurrance Atrial Myxoma resected in 2007 no evidence of recurrence on echo 2013  He continues on home oxygen continuously, but continues to work around his house  Unable to take statins, he notes cardiology has been working on this with elevated cholesterol and LDL  Plan see the patient back in one year,  With chest x-ray  He is on call back list for cardiology in September 2015   Grace Isaac MD  [place] Office [phone] 05/13/2013 10:59 AM

## 2013-06-07 ENCOUNTER — Other Ambulatory Visit: Payer: Self-pay | Admitting: Interventional Cardiology

## 2013-06-08 ENCOUNTER — Other Ambulatory Visit: Payer: Self-pay | Admitting: Vascular Surgery

## 2013-06-08 DIAGNOSIS — Z48812 Encounter for surgical aftercare following surgery on the circulatory system: Secondary | ICD-10-CM

## 2013-06-08 DIAGNOSIS — I6529 Occlusion and stenosis of unspecified carotid artery: Secondary | ICD-10-CM

## 2013-06-14 ENCOUNTER — Other Ambulatory Visit: Payer: Self-pay | Admitting: Interventional Cardiology

## 2013-09-02 ENCOUNTER — Emergency Department (HOSPITAL_COMMUNITY): Payer: Medicare Other

## 2013-09-02 ENCOUNTER — Inpatient Hospital Stay (HOSPITAL_COMMUNITY)
Admission: EM | Admit: 2013-09-02 | Discharge: 2013-09-06 | DRG: 189 | Disposition: A | Discharge status: Home / Self Care | Payer: Medicare Other | Attending: Internal Medicine | Admitting: Internal Medicine

## 2013-09-02 ENCOUNTER — Encounter (HOSPITAL_COMMUNITY): Payer: Self-pay | Admitting: Emergency Medicine

## 2013-09-02 DIAGNOSIS — Z9889 Other specified postprocedural states: Secondary | ICD-10-CM

## 2013-09-02 DIAGNOSIS — E119 Type 2 diabetes mellitus without complications: Secondary | ICD-10-CM | POA: Diagnosis present

## 2013-09-02 DIAGNOSIS — G2581 Restless legs syndrome: Secondary | ICD-10-CM | POA: Diagnosis present

## 2013-09-02 DIAGNOSIS — Z902 Acquired absence of lung [part of]: Secondary | ICD-10-CM

## 2013-09-02 DIAGNOSIS — R202 Paresthesia of skin: Secondary | ICD-10-CM

## 2013-09-02 DIAGNOSIS — I209 Angina pectoris, unspecified: Secondary | ICD-10-CM | POA: Diagnosis present

## 2013-09-02 DIAGNOSIS — J449 Chronic obstructive pulmonary disease, unspecified: Secondary | ICD-10-CM | POA: Diagnosis present

## 2013-09-02 DIAGNOSIS — R259 Unspecified abnormal involuntary movements: Secondary | ICD-10-CM | POA: Diagnosis present

## 2013-09-02 DIAGNOSIS — Z8673 Personal history of transient ischemic attack (TIA), and cerebral infarction without residual deficits: Secondary | ICD-10-CM

## 2013-09-02 DIAGNOSIS — I739 Peripheral vascular disease, unspecified: Secondary | ICD-10-CM

## 2013-09-02 DIAGNOSIS — Z7901 Long term (current) use of anticoagulants: Secondary | ICD-10-CM

## 2013-09-02 DIAGNOSIS — I6529 Occlusion and stenosis of unspecified carotid artery: Secondary | ICD-10-CM | POA: Diagnosis present

## 2013-09-02 DIAGNOSIS — I779 Disorder of arteries and arterioles, unspecified: Secondary | ICD-10-CM

## 2013-09-02 DIAGNOSIS — I10 Essential (primary) hypertension: Secondary | ICD-10-CM | POA: Diagnosis present

## 2013-09-02 DIAGNOSIS — H9190 Unspecified hearing loss, unspecified ear: Secondary | ICD-10-CM | POA: Diagnosis present

## 2013-09-02 DIAGNOSIS — M129 Arthropathy, unspecified: Secondary | ICD-10-CM | POA: Diagnosis present

## 2013-09-02 DIAGNOSIS — Z87891 Personal history of nicotine dependence: Secondary | ICD-10-CM

## 2013-09-02 DIAGNOSIS — Z951 Presence of aortocoronary bypass graft: Secondary | ICD-10-CM

## 2013-09-02 DIAGNOSIS — E781 Pure hyperglyceridemia: Secondary | ICD-10-CM | POA: Diagnosis present

## 2013-09-02 DIAGNOSIS — R0689 Other abnormalities of breathing: Secondary | ICD-10-CM

## 2013-09-02 DIAGNOSIS — G259 Extrapyramidal and movement disorder, unspecified: Secondary | ICD-10-CM | POA: Diagnosis present

## 2013-09-02 DIAGNOSIS — G253 Myoclonus: Secondary | ICD-10-CM

## 2013-09-02 DIAGNOSIS — Z85118 Personal history of other malignant neoplasm of bronchus and lung: Secondary | ICD-10-CM

## 2013-09-02 DIAGNOSIS — J9620 Acute and chronic respiratory failure, unspecified whether with hypoxia or hypercapnia: Principal | ICD-10-CM | POA: Diagnosis present

## 2013-09-02 DIAGNOSIS — R200 Anesthesia of skin: Secondary | ICD-10-CM

## 2013-09-02 DIAGNOSIS — R079 Chest pain, unspecified: Secondary | ICD-10-CM | POA: Diagnosis present

## 2013-09-02 DIAGNOSIS — E874 Mixed disorder of acid-base balance: Secondary | ICD-10-CM | POA: Diagnosis present

## 2013-09-02 DIAGNOSIS — D151 Benign neoplasm of heart: Secondary | ICD-10-CM | POA: Diagnosis present

## 2013-09-02 DIAGNOSIS — W19XXXA Unspecified fall, initial encounter: Secondary | ICD-10-CM | POA: Diagnosis present

## 2013-09-02 DIAGNOSIS — Z8249 Family history of ischemic heart disease and other diseases of the circulatory system: Secondary | ICD-10-CM

## 2013-09-02 DIAGNOSIS — E873 Alkalosis: Secondary | ICD-10-CM | POA: Diagnosis present

## 2013-09-02 DIAGNOSIS — Z9981 Dependence on supplemental oxygen: Secondary | ICD-10-CM

## 2013-09-02 DIAGNOSIS — R531 Weakness: Secondary | ICD-10-CM

## 2013-09-02 DIAGNOSIS — R279 Unspecified lack of coordination: Secondary | ICD-10-CM | POA: Diagnosis present

## 2013-09-02 DIAGNOSIS — Z79899 Other long term (current) drug therapy: Secondary | ICD-10-CM

## 2013-09-02 DIAGNOSIS — J4489 Other specified chronic obstructive pulmonary disease: Secondary | ICD-10-CM | POA: Diagnosis present

## 2013-09-02 DIAGNOSIS — I2510 Atherosclerotic heart disease of native coronary artery without angina pectoris: Secondary | ICD-10-CM | POA: Diagnosis present

## 2013-09-02 LAB — COMPREHENSIVE METABOLIC PANEL
ALK PHOS: 66 U/L (ref 39–117)
ALT: 21 U/L (ref 0–53)
AST: 19 U/L (ref 0–37)
Albumin: 3.4 g/dL — ABNORMAL LOW (ref 3.5–5.2)
BILIRUBIN TOTAL: 0.4 mg/dL (ref 0.3–1.2)
BUN: 22 mg/dL (ref 6–23)
CHLORIDE: 87 meq/L — AB (ref 96–112)
CO2: >45 mEq/L (ref 19–32)
Calcium: 9.9 mg/dL (ref 8.4–10.5)
Creatinine, Ser: 0.88 mg/dL (ref 0.50–1.35)
GFR calc Af Amer: >90 mL/min (ref >90)
GFR calc non Af Amer: 80 mL/min — ABNORMAL LOW (ref >90)
Glucose, Bld: 181 mg/dL — ABNORMAL HIGH (ref 70–99)
Potassium: 4.3 mEq/L (ref 3.7–5.3)
Sodium: 137 mEq/L (ref 137–147)
Total Protein: 6.4 g/dL (ref 6.0–8.3)

## 2013-09-02 LAB — CBC WITH DIFFERENTIAL/PLATELET
Basophils Absolute: 0 10*3/uL (ref 0.0–0.1)
Basophils Relative: 0 % (ref 0–1)
EOS ABS: 0 10*3/uL (ref 0.0–0.7)
Eosinophils Relative: 0 % (ref 0–5)
HEMATOCRIT: 39.1 % (ref 39.0–52.0)
Hemoglobin: 11.9 g/dL — ABNORMAL LOW (ref 13.0–17.0)
LYMPHS ABS: 0.7 10*3/uL (ref 0.7–4.0)
Lymphocytes Relative: 8 % — ABNORMAL LOW (ref 12–46)
MCH: 31.1 pg (ref 26.0–34.0)
MCHC: 30.4 g/dL (ref 30.0–36.0)
MCV: 102.1 fL — ABNORMAL HIGH (ref 78.0–100.0)
MONO ABS: 1.1 10*3/uL — AB (ref 0.1–1.0)
Monocytes Relative: 12 % (ref 3–12)
Neutro Abs: 7.5 10*3/uL (ref 1.7–7.7)
Neutrophils Relative %: 80 % — ABNORMAL HIGH (ref 43–77)
PLATELETS: 183 10*3/uL (ref 150–400)
RBC: 3.83 MIL/uL — ABNORMAL LOW (ref 4.22–5.81)
RDW: 13.6 % (ref 11.5–15.5)
WBC: 9.3 10*3/uL (ref 4.0–10.5)

## 2013-09-02 LAB — I-STAT ARTERIAL BLOOD GAS, ED
Acid-Base Excess: 21 mmol/L — ABNORMAL HIGH (ref 0.0–2.0)
BICARBONATE: 51.6 meq/L — AB (ref 20.0–24.0)
O2 Saturation: 89 %
PCO2 ART: 94.1 mmHg — AB (ref 35.0–45.0)
PO2 ART: 66 mmHg — AB (ref 80.0–100.0)
Patient temperature: 98.6
TCO2: >50 mmol/L (ref 0–100)
pH, Arterial: 7.347 — ABNORMAL LOW (ref 7.350–7.450)

## 2013-09-02 LAB — I-STAT TROPONIN, ED: Troponin i, poc: 0.01 ng/mL (ref 0.00–0.08)

## 2013-09-02 LAB — MAGNESIUM: Magnesium: 2.2 mg/dL (ref 1.5–2.5)

## 2013-09-02 LAB — TROPONIN I
Troponin I: <0.3 ng/mL (ref <0.30)
Troponin I: <0.3 ng/mL (ref <0.30)

## 2013-09-02 LAB — TSH: TSH: 2.33 u[IU]/mL (ref 0.350–4.500)

## 2013-09-02 LAB — AMMONIA: AMMONIA: 53 umol/L (ref 11–60)

## 2013-09-02 MED ORDER — LEVETIRACETAM 500 MG PO TABS: 500.0000 mg | ORAL_TABLET | Freq: Two times a day (BID) | ORAL | Status: DC

## 2013-09-02 MED ORDER — POLYETHYLENE GLYCOL 3350 17 G PO PACK
17.0000 g | PACK | Freq: Every day | ORAL | Status: DC | PRN
  Administered 2013-09-05: 17 g via ORAL
  Filled 2013-09-02 (×2): qty 1

## 2013-09-02 MED ORDER — ALBUTEROL SULFATE HFA 108 (90 BASE) MCG/ACT IN AERS: 2.0000 | INHALATION_SPRAY | Freq: Four times a day (QID) | RESPIRATORY_TRACT | Status: DC | PRN

## 2013-09-02 MED ORDER — ACETAZOLAMIDE 250 MG PO TABS
250.0000 mg | ORAL_TABLET | Freq: Two times a day (BID) | ORAL | Status: DC
  Administered 2013-09-02 – 2013-09-06 (×8): 250 mg via ORAL
  Filled 2013-09-02 (×9): qty 1

## 2013-09-02 MED ORDER — CLOPIDOGREL BISULFATE 75 MG PO TABS
75.0000 mg | ORAL_TABLET | Freq: Every day | ORAL | Status: DC
  Administered 2013-09-02 – 2013-09-05 (×4): 75 mg via ORAL
  Filled 2013-09-02 (×5): qty 1

## 2013-09-02 MED ORDER — HEPARIN SODIUM (PORCINE) 5000 UNIT/ML IJ SOLN
5000.0000 [IU] | Freq: Three times a day (TID) | INTRAMUSCULAR | Status: DC
  Administered 2013-09-02 – 2013-09-06 (×12): 5000 [IU] via SUBCUTANEOUS
  Filled 2013-09-02 (×15): qty 1

## 2013-09-02 MED ORDER — ACETAMINOPHEN 500 MG PO TABS
500.0000 mg | ORAL_TABLET | Freq: Four times a day (QID) | ORAL | Status: DC | PRN
Start: 2013-09-02 — End: 2013-09-06
  Filled 2013-09-02: qty 1

## 2013-09-02 MED ORDER — ASPIRIN EC 325 MG PO TBEC
325.0000 mg | DELAYED_RELEASE_TABLET | Freq: Once | ORAL | Status: AC
  Administered 2013-09-02: 325 mg via ORAL
  Filled 2013-09-02: qty 1

## 2013-09-02 MED ORDER — ALBUTEROL SULFATE (2.5 MG/3ML) 0.083% IN NEBU
2.5000 mg | INHALATION_SOLUTION | Freq: Four times a day (QID) | RESPIRATORY_TRACT | Status: DC | PRN
Start: 2013-09-02 — End: 2013-09-02

## 2013-09-02 MED ORDER — EPINEPHRINE 0.3 MG/0.3ML IJ SOAJ
0.3000 mg | INTRAMUSCULAR | Status: DC | PRN
  Filled 2013-09-02: qty 0.6

## 2013-09-02 MED ORDER — ALBUTEROL SULFATE (2.5 MG/3ML) 0.083% IN NEBU
2.5000 mg | INHALATION_SOLUTION | RESPIRATORY_TRACT | Status: DC | PRN
Start: 2013-09-02 — End: 2013-09-06

## 2013-09-02 MED ORDER — SODIUM CHLORIDE 0.9 % IJ SOLN
3.0000 mL | Freq: Two times a day (BID) | INTRAMUSCULAR | Status: DC
  Administered 2013-09-02 – 2013-09-06 (×7): 3 mL via INTRAVENOUS

## 2013-09-02 MED ORDER — SODIUM CHLORIDE 0.9 % IV SOLN
INTRAVENOUS | Status: DC
  Administered 2013-09-02: 22:00:00 via INTRAVENOUS

## 2013-09-02 MED ORDER — VITAMIN D3 25 MCG (1000 UNIT) PO TABS
2000.0000 [IU] | ORAL_TABLET | Freq: Two times a day (BID) | ORAL | Status: DC
  Administered 2013-09-02 – 2013-09-06 (×8): 2000 [IU] via ORAL
  Filled 2013-09-02 (×9): qty 2

## 2013-09-02 MED ORDER — IPRATROPIUM-ALBUTEROL 0.5-2.5 (3) MG/3ML IN SOLN
3.0000 mL | Freq: Four times a day (QID) | RESPIRATORY_TRACT | Status: DC
  Administered 2013-09-02 – 2013-09-04 (×6): 3 mL via RESPIRATORY_TRACT
  Filled 2013-09-02 (×6): qty 3

## 2013-09-02 MED ORDER — NEBIVOLOL HCL 10 MG PO TABS
10.0000 mg | ORAL_TABLET | Freq: Every day | ORAL | Status: DC
  Administered 2013-09-02 – 2013-09-05 (×4): 10 mg via ORAL
  Filled 2013-09-02 (×5): qty 1

## 2013-09-02 NOTE — H&P (Signed)
[facility] History and Physical  SANTIAGO STENZEL FXT:[id number] DOB: 1932-11-01 DOA: 09/02/2013  Referring physician: ED PCP:  [name], MD  Specialists: none  Chief Complaint: Movement disorder, CP, abnormal labs  HPI: 80 y/o ?, known L pneumonectomy sq cell Ca 2000 foll by Dr. Servando Snare, prior 56 pack/yr smoker-now O2 dependent for the past 6-8 months with 2 L at rest . 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 His wife states he also has Restless leg syndrome for the past year and has been on chronic magnesium for the past Their main reason for coming to the hospital is that over the past 2-3 days patient has been having sudden episodes of jerking movements of upper and lower extremities without unilateral weakness. He has also been constipated for the past 4-5 days and they've been taking numerous laxatives which culminated in a good bowel movement today. He's been eating and drinking as usual and had decreased urine output because of constipation but then has had a urine output today. He feels that his legs have been giving away and he has had weakness bilaterally. He is now having lost consciousness any seizures any fevers any chills any nausea until yesterday but did not have any vomiting with this. He's had no dark stool or tarry stools He continues to take his losartan HCTZ as well. He does have a history of dropping his spoon and dropping his oxygen canister which is unusual for him. He has been confused that time as well. His father had a history of Parkinson's disease  In addition he states that he's been having some chest pain over the past 2 days which was relieved by 2 nitroglycerin in the emergency  medical tract. He states that the pain is a pressing type pain not in the arm or in the neck and is unclear as to what may have brought this on. He states that it is worse however with eating and did not see definite food pain relationship.   Emergency room workup CT head negative, portal chest x-ray pulmonary vascular congestion and right lung postpneumonectomy changes on left Sodium 137, potassium 4.3, chloride 87, CO2 above 45, BN/creatinine 22/0.88, LFTs relatively normal Ammonia 53, magnesium pending, troponin 0.01 Hemoglobin 11.9 MCV 102.1 neutrophils 80   Review of Systems: Negative except as above   Past Medical History  Diagnosis Date  . HTN (hypertension)   . History of lung cancer   . Hypertriglyceridemia   . CAD (coronary artery disease)   . Atrial myxoma     Left  . Carotid artery disease   . Hearing loss   . Diabetes mellitus   . Arthritis    Past Surgical History  Procedure Laterality Date  . Cabg x 1 with reverse saphenous vein graft to distal rt coronary artery and excision of left atrial myxoma  07/12/2005    Dr Servando Snare  . Left pneumonectomy  05/03/1998    Dr Servando Snare  . Combined mediastinoscopy and bronchoscopy  05/01/1998    Dr Servando Snare  . Rt  carotid endarterectomy      Dr Kellie Simmering  . Coronary artery bypass graft    . Carotid endarterectomy  2007    Right CEA  . Lung removal, partial  2000    Left lung removed    Social History:  History   Social History Narrative  . No narrative on file    Allergies  Allergen Reactions  . Statins Other (See Comments)    Muscle aches  . Amoxicillin Rash    Family History  Problem Relation Age of Onset  . Heart disease Mother   . Heart disease Sister   . Stomach cancer Father     Prior to Admission medications   Medication Sig Start Date End Date Taking? Authorizing Provider  acetaminophen (TYLENOL) 325 MG tablet Take 650 mg by mouth every 6 (six) hours as needed. For pain    Historical Provider, MD   albuterol (PROVENTIL HFA;VENTOLIN HFA) 108 (90 BASE) MCG/ACT inhaler Inhale 2 puffs into the lungs every 6 (six) hours as needed for wheezing.    Historical Provider, MD  BYSTOLIC 10 MG tablet TAKE 1 TABLET DAILY    Jettie Booze, MD  cetirizine (ZYRTEC) 10 MG tablet Take 10 mg by mouth daily as needed. For allergies    Historical Provider, MD  cholecalciferol (VITAMIN D) 1000 UNITS tablet Take 2,000 Units by mouth 2 (two) times daily.     Historical Provider, MD  clopidogrel (PLAVIX) 75 MG tablet Take 75 mg by mouth daily.    Historical Provider, MD  EPIPEN 2-PAK 0.3 MG/0.3ML DEVI Inject 0.3 mg into the skin See admin instructions. Take as needed for severe allergic reaction 11/04/11   Historical Provider, MD  felodipine (PLENDIL) 10 MG 24 hr tablet TAKE 1 TABLET ONCE A DAY 06/07/13   Jettie Booze, MD  losartan-hydrochlorothiazide [facility]) 100-25 MG per tablet TAKE 1 TABLET DAILY 03/09/13   Jettie Booze, MD  magnesium oxide (MAG-OX) 400 MG tablet Take 400 mg by mouth daily.    Historical Provider, MD  Multiple Vitamins-Minerals (MULTIVITAMINS THER. W/MINERALS) TABS Take 1 tablet by mouth daily.    Historical Provider, MD  Omega-3 Fatty Acids (FISH OIL) 1200 MG CAPS Take 1 capsule by mouth 2 (two) times daily.    Historical Provider, MD   Physical Exam: Filed Vitals:   09/02/13 1510  BP: 127/59  Temp: 99.3 F (37.4 C)  TempSrc: Oral  Resp: 18  SpO2: 91%     General:  Alert pleasant oriented very very hard of hearing   Eyes: EOMI, no pallor no icterus fundus not able to be discerned   ENT: Soft supple no JVD no bruit   Neck: See above   Cardiovascular: S1-S2 no murmur rub or gallop   Respiratory: Clinically clear on right   Abdomen: Soft nontender no rebound   Skin: No lower extremity edema   Musculoskeletal: Range of motion intact   Psychiatric: Euthymic pleasant   Neurologic: Power 5/5. No past pointing, finger-nose-finger test is normal, no  disdiadokinsia, vision by direct confrontation is normal, reflexes are brisk lower extremities. I note occasional tremors and some occasional twitches. I did not test his gait  Labs on Admission:  Basic Metabolic Panel: No results found for this basename: NA, K, CL, CO2, GLUCOSE, BUN, CREATININE, CALCIUM, MG, PHOS,  in the last 168 hours Liver Function Tests: No results found for this basename: AST, ALT, ALKPHOS, BILITOT, PROT, ALBUMIN,  in the last 168 hours No results found for this basename: LIPASE, AMYLASE,  in the last 168 hours No results found for this basename: AMMONIA,  in the last 168 hours CBC:  Recent Labs Lab 09/02/13 1440  WBC 9.3  NEUTROABS  7.5  HGB 11.9*  HCT 39.1  MCV 102.1*  PLT 183   Cardiac Enzymes: No results found for this basename: CKTOTAL, CKMB, CKMBINDEX, TROPONINI,  in the last 168 hours  BNP (last 3 results) No results found for this basename: PROBNP,  in the last 8760 hours CBG: No results found for this basename: GLUCAP,  in the last 168 hours  Radiological Exams on Admission: Dg Chest Port 1 View  09/02/2013   CLINICAL DATA:  Tremors and chest pain ; history of left pneumonectomy  EXAM: PORTABLE CHEST - 1 VIEW  COMPARISON:  DG CHEST 2 VIEW dated 05/13/2013  FINDINGS: There is opacification of most of the left hemithorax which is stable allowing for differences in exposure technique. The right lung is hyperinflated. The interstitial markings are more prominent throughout the right lung today than on the previous study. The left heart border is obscured. The patient has undergone previous median sternotomy and CABG. The pulmonary vascularity on the right is prominent.  IMPRESSION: The findings suggest pulmonary vascular congestion involving the remaining right lung. This may be of cardiac or noncardiac calls. Post pneumonectomy changes on the left are stable.   Electronically Signed   By: David  Martinique   On: 09/02/2013 15:25    EKG: Independently reviewed.  Sinus rhythm PR interval 0.08 QRS axis 45 no ST-T wave acute changes Q waves noted in lead 3 otherwise normal chest x-ray  Assessment/Plan Principal Problem:   Chest pain-patient has some classical findings of angina with heaviness in the chest. He however also states that this is brought on by food which can also be relieved by nitroglycerin. We will cycle cardiac enzymes for now. Repeat EKG in. If pain persists we will involve cardiology in his care. We will continue his bystolic 10 daily.  I will discontinue his ACE inhibitor for now. Continue Plavix 75 daily Active Problems:     Metabolic alkalosis DDX =  Calc AG=Na-[Cl+Hco3]              =5 ? NO acidosis is present  Expected compensation for PCO2 in Met alkalosis is  PCo2=0.7 [HCO3]+ 20 [+/-5]           =0.7*45+20]           =51.5 This is ? close enough to Measured Pco2 Patient is on diuretics such as HCTZ and also took some laxatives recently and had a good stool. We will get a urinary chloride. If the urine chloride is less than 20 and this is probably saline responsive. His blood gas shows a PCO2 of 94.1 with an HCO3 of 51 therefore I believe that this also is probably secondary to physiologic compensation as he has no significant deviation of his pH from normal range [slight respiratory acidosis only] This should resolve with saline regardless of etiology and even if it doesn't him having a pneumonectomy will naturally cause him to be more tachypneic than the average person    HTN (hypertension)-see above discussion   History of lung cancer-stable at present time    Hypertriglyceridemia-patient has allergies to statins therefore he will not benefit from statin therapy and can continue his omega-3 fatty acids as an outpatient. He may also benefit from red rice yeast or Guar gum for secondary prevention     CAD (coronary artery disease)-see above discussion. Continue Plavix continue other medications      Atrial myxoma-history  of. Stable    Occlusion and stenosis of carotid artery without mention of cerebral  infarction-last carotid Doppler showed 60-79 percent occlusion       Movement disorder-unclear etiology.he does seem to have some jerks simlar to asterixis.  Metabolic derangments other than Ammonia can cause this-including Hypocalcemia. I will get a magnesium level and discotninue his Magensium as early signs of  Hypermagnesemia can  be muscle wekaness  His ammonia is 53 and not elevated, but would treat with Lactulose for benefits of both decreasing ammonia and laxative effect I will hold his zyrtec as well     Full code 75 min Tele Long discussion with wife and son bedside  [facility] Pager [phone]  If 7PM-7AM, please contact night-coverage [email or web address] Password [facility] 09/02/2013, 3:29 PM

## 2013-09-02 NOTE — ED Notes (Addendum)
Pt from home with the wife, came by [facility] for c/o of large muscle tremors and CP. Pt been c/o of having interment headaches for 2 weeks, tremors started 2 days ago and today started having CP constant and was relief by 2 nitros. EMS report pt fell at home, pt denies any LOC, no head trauma, pt able to get up from the grown without assistance.

## 2013-09-02 NOTE — ED Provider Notes (Signed)
CSN: [id number]     Arrival date & time 09/02/13  1348 History   First MD Initiated Contact with Patient 09/02/13 1350     Chief Complaint  Patient presents with  . Tremors  . Chest Pain    HPI  Chest pain 80 y.o. male with h/o lung cancer s/p left lung resection, atrial myxoma s/p tumor resection, CAD, DM, HTN, and h/o TIA reporting 2 days of chest pain that is substernal and more of a pressure sensation. He is unable to provide timing hx and wife was not aware of his chest pain. He states pain became worse this morning and in EMS he was dosed nitroglycerin which improved pain. Pain is no longer present. Denies similar previous symptoms, dyspnea, cough, nausea, emesis, dizziness, or diaphoresis. Had upper abodminal pain in the past that was related to constipation, which has resolved with large stool this morning.  Myoclonus Pt and wife are most concerned about this problem. State it has been present 2 days in arms, making it very difficult to perform ADLs like feeding himself. Legs started to become involved today, when pt complained of inability walking and had to use wife's walker. When walking with walker to kitchen, he fell backwards and hit the back of his head. He was also confused for a few minutes after waking up this morning, which has not happened before. Denies pain or syncope and wife states it did not seem like he hit it hard. He has chronic LE tremors that are milder and different from these.   Denies fevers, chills, joint swelling, or recent medication change. Reports a few days of poor PO intake.   Past Medical History  Diagnosis Date  . HTN (hypertension)   . History of lung cancer   . Hypertriglyceridemia   . CAD (coronary artery disease)   . Atrial myxoma     Left  . Carotid artery disease   . Hearing loss   . Diabetes mellitus   . Arthritis    Past Surgical History  Procedure Laterality Date  . Cabg x 1 with reverse saphenous vein graft to distal rt coronary  artery and excision of left atrial myxoma  07/12/2005    Dr Servando Snare  . Left pneumonectomy  05/03/1998    Dr Servando Snare  . Combined mediastinoscopy and bronchoscopy  05/01/1998    Dr Servando Snare  . Rt  carotid endarterectomy      Dr Kellie Simmering  . Coronary artery bypass graft    . Carotid endarterectomy  2007    Right CEA  . Lung removal, partial  2000    Left lung removed    Family History  Problem Relation Age of Onset  . Heart disease Mother   . Heart disease Sister   . Stomach cancer Father    History  Substance Use Topics  . Smoking status: Former Smoker    Types: Cigarettes    Quit date: 07/21/1997  . Smokeless tobacco: Former Systems developer  . Alcohol Use: No    Review of Systems  All other systems reviewed and are negative.   Allergies  Statins and Amoxicillin  Home Medications   Prior to Admission medications   Medication Sig Start Date End Date Taking? Authorizing Provider  acetaminophen (TYLENOL) 325 MG tablet Take 650 mg by mouth every 6 (six) hours as needed. For pain    Historical Provider, MD  albuterol (PROVENTIL HFA;VENTOLIN HFA) 108 (90 BASE) MCG/ACT inhaler Inhale 2 puffs into the lungs every 6 (six) hours  as needed for wheezing.    Historical Provider, MD  BYSTOLIC 10 MG tablet TAKE 1 TABLET DAILY    Jettie Booze, MD  cetirizine (ZYRTEC) 10 MG tablet Take 10 mg by mouth daily as needed. For allergies    Historical Provider, MD  cholecalciferol (VITAMIN D) 1000 UNITS tablet Take 2,000 Units by mouth 2 (two) times daily.     Historical Provider, MD  clopidogrel (PLAVIX) 75 MG tablet Take 75 mg by mouth daily.    Historical Provider, MD  EPIPEN 2-PAK 0.3 MG/0.3ML DEVI Inject 0.3 mg into the skin See admin instructions. Take as needed for severe allergic reaction 11/04/11   Historical Provider, MD  felodipine (PLENDIL) 10 MG 24 hr tablet TAKE 1 TABLET ONCE A DAY 06/07/13   Jettie Booze, MD  losartan-hydrochlorothiazide [facility]) 100-25 MG per tablet TAKE 1 TABLET  DAILY 03/09/13   Jettie Booze, MD  magnesium oxide (MAG-OX) 400 MG tablet Take 400 mg by mouth daily.    Historical Provider, MD  Multiple Vitamins-Minerals (MULTIVITAMINS THER. W/MINERALS) TABS Take 1 tablet by mouth daily.    Historical Provider, MD  Omega-3 Fatty Acids (FISH OIL) 1200 MG CAPS Take 1 capsule by mouth 2 (two) times daily.    Historical Provider, MD   BP 127/59  Temp(Src) 99.3 F (37.4 C) (Oral)  Resp 18  Ht 5\' 11"  (1.803 m)  Wt 220 lb (99.791 kg)  BMI 30.70 kg/m2  SpO2 91% Physical Exam GEN: NAD, pleasant HEENT: Atraumatic, normocephalic, neck supple, EOMI, sclera clear, Left pupil 9mm, right 47mm, round and reactive to light bilaterally, no teeth, very hard of hearing CV: RRR, II/VI systolic murmur, rubs, or gallops, 1+ bilateral DP pulses, 2+ bilateral radial pulses PULM: Left lung sounds mostly absent, right CTAB with occasional expiratory wheeze; [place] in place ABD: Soft, nontender, mildly distended, NABS, no organomegaly SKIN: No rash or cyanosis; warm and well-perfused EXTR: No calf tenderness; 1+ bilateral LE edema to mid shin, LE strength 4/5, UE strenth 4/5 PSYCH: Mood and affect euthymic, normal rate and volume of speech NEURO: Awake, alert, normal speech, asterixis present, left leg weakness on standing on left foot, intermittent myoclonus bilateral upper extremities, core, and legs, UE most pronounced. CN 2-12 tested and intact, FNF in tact.  ED Course  Procedures (including critical care time) Labs Review Labs Reviewed  COMPREHENSIVE METABOLIC PANEL - Abnormal; Notable for the following:    Chloride 87 (*)    CO2 >45 (*)    Glucose, Bld 181 (*)    Albumin 3.4 (*)    GFR calc non Af Amer 80 (*)    All other components within normal limits  CBC WITH DIFFERENTIAL - Abnormal; Notable for the following:    RBC 3.83 (*)    Hemoglobin 11.9 (*)    MCV 102.1 (*)    Neutrophils Relative % 80 (*)    Lymphocytes Relative 8 (*)    Monocytes Absolute 1.1  (*)    All other components within normal limits  AMMONIA  I-STAT TROPOININ, ED    Imaging Review Ct Head Wo Contrast  09/02/2013   CLINICAL DATA:  Headache.  Vertigo.  Hypertension.  EXAM: CT HEAD WITHOUT CONTRAST  TECHNIQUE: Contiguous axial images were obtained from the base of the skull through the vertex without intravenous contrast.  COMPARISON:  05/18/2011 MR. 05/17/2011 CT.  FINDINGS: No intracranial hemorrhage.  Mild small vessel disease type changes. No CT evidence of large acute infarct. If posterior fossa infarct  is of high clinical concern, MR imaging may be considered.  No intracranial mass lesion noted on this unenhanced exam.  No hydrocephalus.  Vascular calcifications.  Mild mucosal thickening inferior aspect right maxillary sinus and right ethmoid sinus air cells.  IMPRESSION: No intracranial hemorrhage or CT evidence of large acute infarct.  Please see above.   Electronically Signed   By: Chauncey Cruel M.D.   On: 09/02/2013 15:26   Dg Chest Port 1 View  09/02/2013   CLINICAL DATA:  Tremors and chest pain ; history of left pneumonectomy  EXAM: PORTABLE CHEST - 1 VIEW  COMPARISON:  DG CHEST 2 VIEW dated 05/13/2013  FINDINGS: There is opacification of most of the left hemithorax which is stable allowing for differences in exposure technique. The right lung is hyperinflated. The interstitial markings are more prominent throughout the right lung today than on the previous study. The left heart border is obscured. The patient has undergone previous median sternotomy and CABG. The pulmonary vascularity on the right is prominent.  IMPRESSION: The findings suggest pulmonary vascular congestion involving the remaining right lung. This may be of cardiac or noncardiac calls. Post pneumonectomy changes on the left are stable.   Electronically Signed   By: David  Martinique   On: 09/02/2013 15:25     EKG Interpretation None      MDM   Final diagnoses:  Chest pain  Weakness  Myoclonus   Hypercapnia   80 y.o. male with complex PMH including CAD, HTN, and DM here with chest pain and tremors.  Chest pain - DDx includes cardiovascular (MI, PE), musculoskeletal, and gastrointestinal. Hx is concerning for MI.  - Troponin neg, EKG unremarkable with q waves present but unchanged from previous. - Dosed asa 325 mg after confirming no acute changes head CT.  Myoclonus - DDx includes metabolic (K, Ca, Na, ammonia level abnormalities) or structural with stroke or epilepsy. No h/o seizure-like activity. Pt does have left leg weakness. Head trauma was minor and after leg weakness and myoclonus symptoms. With h/o TIA and CAD, must consider this. - CT head>>no acute infarct or hemorrhage - CMET>> CO2>45. Likely metabolic alkalosis due to contraction (poor diet in elderly patient). Will order ABG. - CBC - Called neurohospitalist for consultation.  Will admit to observation in telemetry bed. Called and spoke with [facility] service who accepted admission.  Hilton Sinclair, MD PGY-2, [facility]    Hilton Sinclair, MD 09/02/13 [phone]

## 2013-09-02 NOTE — Progress Notes (Signed)
Decreased to 2l O2 bleedin through cpap.  HR61, sats94%.

## 2013-09-02 NOTE — ED Notes (Signed)
Neurology at the bedside

## 2013-09-02 NOTE — ED Notes (Signed)
Exporting failed. Contacted EKG Services. EKG preformed at 13:57

## 2013-09-02 NOTE — ED Notes (Signed)
RT at the bedside.

## 2013-09-02 NOTE — ED Notes (Signed)
Dr. Jeneen Rinks at the bedside.

## 2013-09-02 NOTE — ED Notes (Signed)
  Pt transported to ct 

## 2013-09-02 NOTE — ED Notes (Signed)
Phlebotomy at the bedside  

## 2013-09-02 NOTE — Consult Note (Signed)
Name: Calvin [name] MRN: [id number] DOB: 1932-10-12    ADMISSION DATE:  09/02/2013 CONSULTATION DATE:  09/02/2013  REFERRING MD :  Verneita Griffes  CHIEF COMPLAINT:  Myoclonus  BRIEF PATIENT DESCRIPTION:  80 yo male presented with jerking limb movements, headaches and chest pain.  He has hx of NSCLC s/p Lt pneumonectomy in 2000, chronic hypoxic respiratory failure on 2 liters oxygen.  He was found to have chronic hypercapnia on ABG.  PCCM asked to assess respiratory failure.  SIGNIFICANT EVENTS: 5/14 Admit, neurology/pulmonary consulted  STUDIES:  5/14 CT head >> no acute findings  LINES / TUBES: PIV  CULTURES: None  ANTIBIOTICS: None  HISTORY OF PRESENT ILLNESS:   80 yo male developed progressive confusion, sleepiness, headaches, and jerking movements over the past two days.  His wife reports this happened after he was mowing the lawn, and then had more trouble with his breathing.  He has hx of COPD and uses albuterol at home >> he was on combivent previously, and liked this better.  He has been tried on spiriva, but did not tolerate this (uncertain why).  He uses 2 liters oxygen at home, but reports his oxygen level can go as low as the 60's when he exerts himself.  His wife reports he snores while asleep, but is unsure if he has sleep apnea.  He was not started on any new medications recently.  He has cough with clear sputum.  He denies chest pain.  He has trouble with his hearing.  He has hx of RLS, but jerking movements are new finding.  He has intermittent headaches for the past 6 yrs, but more prominent over the past few days.  His wife reports he had trouble with constipation for the past few days, but had BM this AM.  He also had decreased urine outpt.  PAST MEDICAL HISTORY :  Past Medical History  Diagnosis Date  . HTN (hypertension)   . History of lung cancer   . Hypertriglyceridemia   . CAD (coronary artery disease)   . Atrial myxoma     Left  . Carotid artery disease    . Hearing loss   . Diabetes mellitus   . Arthritis    Past Surgical History  Procedure Laterality Date  . Cabg x 1 with reverse saphenous vein graft to distal rt coronary artery and excision of left atrial myxoma  07/12/2005    Dr Servando Snare  . Left pneumonectomy  05/03/1998    Dr Servando Snare  . Combined mediastinoscopy and bronchoscopy  05/01/1998    Dr Servando Snare  . Rt  carotid endarterectomy      Dr Kellie Simmering  . Coronary artery bypass graft    . Carotid endarterectomy  2007    Right CEA  . Lung removal, partial  2000    Left lung removed    Prior to Admission medications   Medication Sig Start Date End Date Taking? Authorizing Provider  acetaminophen (TYLENOL) 325 MG tablet Take 650 mg by mouth every 6 (six) hours as needed. For pain   Yes Historical Provider, MD  albuterol (PROVENTIL HFA;VENTOLIN HFA) 108 (90 BASE) MCG/ACT inhaler Inhale 2 puffs into the lungs every 6 (six) hours as needed for wheezing.   Yes Historical Provider, MD  BYSTOLIC 10 MG tablet TAKE 1 TABLET DAILY   Yes Jettie Booze, MD  cetirizine (ZYRTEC) 10 MG tablet Take 10 mg by mouth daily as needed. For allergies   Yes Historical Provider, MD  cholecalciferol (  VITAMIN D) 1000 UNITS tablet Take 2,000 Units by mouth 2 (two) times daily.    Yes Historical Provider, MD  clopidogrel (PLAVIX) 75 MG tablet Take 75 mg by mouth daily.   Yes Historical Provider, MD  felodipine (PLENDIL) 10 MG 24 hr tablet TAKE 1 TABLET ONCE A DAY 06/07/13  Yes Jettie Booze, MD  losartan-hydrochlorothiazide [facility]) 100-25 MG per tablet TAKE 1 TABLET DAILY 03/09/13  Yes Jettie Booze, MD  magnesium oxide (MAG-OX) 400 MG tablet Take 400 mg by mouth daily.   Yes Historical Provider, MD  Multiple Vitamins-Minerals (MULTIVITAMINS THER. W/MINERALS) TABS Take 1 tablet by mouth daily.   Yes Historical Provider, MD  Omega-3 Fatty Acids (FISH OIL) 1200 MG CAPS Take 1 capsule by mouth 2 (two) times daily.   Yes Historical Provider, MD    EPIPEN 2-PAK 0.3 MG/0.3ML DEVI Inject 0.3 mg into the skin See admin instructions. Take as needed for severe allergic reaction 11/04/11   Historical Provider, MD   Allergies  Allergen Reactions  . Statins Other (See Comments)    Muscle aches  . Amoxicillin Rash    FAMILY HISTORY:  Family History  Problem Relation Age of Onset  . Heart disease Mother   . Heart disease Sister   . Stomach cancer Father    SOCIAL HISTORY:  reports that he quit smoking about 16 years ago. His smoking use included Cigarettes. He smoked 0.00 packs per day. He has quit using smokeless tobacco. He reports that he does not drink alcohol or use illicit drugs.  REVIEW OF SYSTEMS:   Negative except above.  SUBJECTIVE:   VITAL SIGNS: Temp:  [98.1 F (36.7 C)-99.3 F (37.4 C)] 98.1 F (36.7 C) (05/14 1738) Pulse Rate:  [78] 78 (05/14 1738) Resp:  [18-19] 19 (05/14 1738) BP: (89-127)/(59-71) 89/71 mmHg (05/14 1738) SpO2:  [89 %-91 %] 89 % (05/14 1738) Weight:  [217 lb (98.431 kg)-220 lb (99.791 kg)] 217 lb (98.431 kg) (05/14 1738)  PHYSICAL EXAMINATION: General: no distress, wearing oxygen Neuro:  Awake, difficulty with hearing, intermittently follows commands, normal strength, CN intact, no myoclonus noted HEENT:  Pupils reactive, no sinus tenderness, MP 3, no LAN Cardiovascular:  Regular, no murmur Lungs:  Scattered wheeze/rhonchi on Rt, absent breath sounds on Lt Abdomen:  Soft, non tender, normal bowel sounds Musculoskeletal:  No edema Skin:  No rashes  CBC Recent Labs     09/02/13  1440  WBC  9.3  HGB  11.9*  HCT  39.1  PLT  183    BMET Recent Labs     09/02/13  1440  NA  137  K  4.3  CL  87*  CO2  >45*  BUN  22  CREATININE  0.88  GLUCOSE  181*    Electrolytes Recent Labs     09/02/13  1440  09/02/13  1630  CALCIUM  9.9   --   MG   --   2.2    ABG Recent Labs     09/02/13  1627  PHART  7.347*  PCO2ART  94.1*  PO2ART  66.0*    Liver Enzymes Recent Labs      09/02/13  1440  AST  19  ALT  21  ALKPHOS  66  BILITOT  0.4  ALBUMIN  3.4*    Cardiac Enzymes Recent Labs     09/02/13  1652  TROPONINI  <0.30   Imaging Ct Head Wo Contrast  09/02/2013   CLINICAL DATA:  Headache.  Vertigo.  Hypertension.  EXAM: CT HEAD WITHOUT CONTRAST  TECHNIQUE: Contiguous axial images were obtained from the base of the skull through the vertex without intravenous contrast.  COMPARISON:  05/18/2011 MR. 05/17/2011 CT.  FINDINGS: No intracranial hemorrhage.  Mild small vessel disease type changes. No CT evidence of large acute infarct. If posterior fossa infarct is of high clinical concern, MR imaging may be considered.  No intracranial mass lesion noted on this unenhanced exam.  No hydrocephalus.  Vascular calcifications.  Mild mucosal thickening inferior aspect right maxillary sinus and right ethmoid sinus air cells.  IMPRESSION: No intracranial hemorrhage or CT evidence of large acute infarct.  Please see above.   Electronically Signed   By: Chauncey Cruel M.D.   On: 09/02/2013 15:26   Dg Chest Port 1 View  09/02/2013   CLINICAL DATA:  Tremors and chest pain ; history of left pneumonectomy  EXAM: PORTABLE CHEST - 1 VIEW  COMPARISON:  DG CHEST 2 VIEW dated 05/13/2013  FINDINGS: There is opacification of most of the left hemithorax which is stable allowing for differences in exposure technique. The right lung is hyperinflated. The interstitial markings are more prominent throughout the right lung today than on the previous study. The left heart border is obscured. The patient has undergone previous median sternotomy and CABG. The pulmonary vascularity on the right is prominent.  IMPRESSION: The findings suggest pulmonary vascular congestion involving the remaining right lung. This may be of cardiac or noncardiac calls. Post pneumonectomy changes on the left are stable.   Electronically Signed   By: David  Martinique   On: 09/02/2013 15:25         ASSESSMENT:  80 yo male  presenting with changed mental status, hypersomnolence, headaches, and myoclonus.  He has chronic hypoxic/hypercapnic respiratory failure related to COPD, s/p Lt pneumonectomy, and possible sleep disordered breathing.  He likely had acute decompensation of ventilatory status precipitated by hypoxia from overexertion while doing yard work, and exacerbation of allergens.  PLAN:  Continue acetazolamide for now F/u BMET Adjust oxygen to keep SpO2 90 to 94% Will need to determine optimal oxygen set up with exertion while in hospital Add scheduled duoneb q6h while awake, with prn albuterol in between Empiric trial of auto CPAP qhs >> may need further sleep study as outpt  Updated pts wife and sister (nurse anesthetist) at bedside about plan.  Chesley Mires, MD [facility] Pulmonary/Critical Care 09/02/2013, 6:36 PM Pager:  [phone] After 3pm call: [phone]

## 2013-09-02 NOTE — Consult Note (Addendum)
NEURO HOSPITALIST CONSULT NOTE    Reason for Consult: myoclonus  HPI:                                                                                                                                          Calvin [name] is an 80 y.o. male with history of lung cancer and prior left pneumonectomy.  Patient is O2 dependent at home and usually has Spo2 of 92-93 while on oxygen.  He has RLS at night but no myoclonus or tremor noted by wife.  He is bale to ambulate without assistance for short distances but will often need to sit down due to drop in o2 saturations (at times down to 70's). Over the last few days he has noted to eat less and started to have UE and LE "jerking" that occurs intermittent and most notable when he holds a posture.  Today he was noted to get up and suddenly his legs gave out underneath him and he fell against a dresser. Hdid not have s a syncopal event and sustained no trauma to his head. Wife is concerned about the progressive increase in both amplitude and frequency of "jerking".   Past Medical History  Diagnosis Date  . HTN (hypertension)   . History of lung cancer   . Hypertriglyceridemia   . CAD (coronary artery disease)   . Atrial myxoma     Left  . Carotid artery disease   . Hearing loss   . Diabetes mellitus   . Arthritis     Past Surgical History  Procedure Laterality Date  . Cabg x 1 with reverse saphenous vein graft to distal rt coronary artery and excision of left atrial myxoma  07/12/2005    Dr Servando Snare  . Left pneumonectomy  05/03/1998    Dr Servando Snare  . Combined mediastinoscopy and bronchoscopy  05/01/1998    Dr Servando Snare  . Rt  carotid endarterectomy      Dr Kellie Simmering  . Coronary artery bypass graft    . Carotid endarterectomy  2007    Right CEA  . Lung removal, partial  2000    Left lung removed     Family History  Problem Relation Age of Onset  . Heart disease Mother   . Heart disease Sister   . Stomach cancer Father      Social History:  reports that he quit smoking about 16 years ago. His smoking use included Cigarettes. He smoked 0.00 packs per day. He has quit using smokeless tobacco. He reports that he does not drink alcohol or use illicit drugs.  Allergies  Allergen Reactions  . Statins Other (See Comments)    Muscle aches  . Amoxicillin Rash    MEDICATIONS:  No current facility-administered medications for this encounter.   Current Outpatient Prescriptions  Medication Sig Dispense Refill  . acetaminophen (TYLENOL) 325 MG tablet Take 650 mg by mouth every 6 (six) hours as needed. For pain      . albuterol (PROVENTIL HFA;VENTOLIN HFA) 108 (90 BASE) MCG/ACT inhaler Inhale 2 puffs into the lungs every 6 (six) hours as needed for wheezing.      Marland Kitchen BYSTOLIC 10 MG tablet TAKE 1 TABLET DAILY  90 tablet  0  . cetirizine (ZYRTEC) 10 MG tablet Take 10 mg by mouth daily as needed. For allergies      . cholecalciferol (VITAMIN D) 1000 UNITS tablet Take 2,000 Units by mouth 2 (two) times daily.       . clopidogrel (PLAVIX) 75 MG tablet Take 75 mg by mouth daily.      Marland Kitchen EPIPEN 2-PAK 0.3 MG/0.3ML DEVI Inject 0.3 mg into the skin See admin instructions. Take as needed for severe allergic reaction      . felodipine (PLENDIL) 10 MG 24 hr tablet TAKE 1 TABLET ONCE A DAY  90 tablet  2  . losartan-hydrochlorothiazide (HYZAAR) 100-25 MG per tablet TAKE 1 TABLET DAILY  90 tablet  2  . magnesium oxide (MAG-OX) 400 MG tablet Take 400 mg by mouth daily.      . Multiple Vitamins-Minerals (MULTIVITAMINS THER. W/MINERALS) TABS Take 1 tablet by mouth daily.      . Omega-3 Fatty Acids (FISH OIL) 1200 MG CAPS Take 1 capsule by mouth 2 (two) times daily.          ROS:                                                                                                                                       History  obtained from wife  General ROS: negative for - chills, fatigue, fever, night sweats, weight gain or weight loss Psychological ROS: negative for - behavioral disorder, hallucinations, memory difficulties, mood swings or suicidal ideation Ophthalmic ROS: negative for - blurry vision, double vision, eye pain or loss of vision ENT ROS: negative for - epistaxis, nasal discharge, oral lesions, sore throat, tinnitus or vertigo Allergy and Immunology ROS: negative for - hives or itchy/watery eyes Hematological and Lymphatic ROS: negative for - bleeding problems, bruising or swollen lymph nodes Endocrine ROS: negative for - galactorrhea, hair pattern changes, polydipsia/polyuria or temperature intolerance Respiratory ROS: negative for - cough, hemoptysis, shortness of breath or wheezing Cardiovascular ROS: negative for - chest pain, dyspnea on exertion, edema or irregular heartbeat Gastrointestinal ROS: negative for - abdominal pain, diarrhea, hematemesis, nausea/vomiting or stool incontinence Genito-Urinary ROS: negative for - dysuria, hematuria, incontinence or urinary frequency/urgency Musculoskeletal ROS: negative for - joint swelling or muscular weakness Neurological ROS: as noted in HPI Dermatological ROS: negative for rash and skin lesion changes   Blood pressure 127/59, temperature 98.4 F (36.9 C), temperature source Oral,  resp. rate 18, height 5\' 11"  (1.803 m), weight 99.791 kg (220 lb), SpO2 91.00%.   Neurologic Examination:                                                                                                      Mental Status: Alert, oriented, thought content appropriate.  Speech fluent without evidence of aphasia.  Able to follow 3 step commands without difficulty. Cranial Nerves: II: Discs flat bilaterally; Visual fields grossly normal, pupils  Right 66mm and left 39mm, round, reactive to light and accommodation III,IV, VI: ptosis not present, extra-ocular motions intact  bilaterally V,VII: smile symmetric, facial light touch sensation normal bilaterally VIII: hearing decreased bilaterally IX,X: gag reflex present XI: bilateral shoulder shrug XII: midline tongue extension without atrophy or fasciculations  Motor: Right : Upper extremity   5/5    Left:     Upper extremity   5/5  Lower extremity   5/5     Lower extremity   5/5 --when hands held outstretched he has noted intermittent myoclonus --when patient sits up legs show myoclonus in bilateral legs Tone and bulk:normal tone throughout; no atrophy noted Sensory: Pinprick and light touch intact throughout, bilaterally Deep Tendon Reflexes:  Right: Upper Extremity   Left: Upper extremity   biceps (C-5 to C-6) 2/4   biceps (C-5 to C-6) 2/4 tricep (C7) 2/4    triceps (C7) 2/4 Brachioradialis (C6) 2/4  Brachioradialis (C6) 2/4  Lower Extremity Lower Extremity  quadriceps (L-2 to L-4) 2/4   quadriceps (L-2 to L-4) 2/4 Achilles (S1) 1/4   Achilles (S1) 1/4  Plantars: Right: downgoing   Left: downgoing Cerebellar: normal finger-to-nose,  normal heel-to-shin test Gait: not tested.  CV: pulses palpable throughout    Lab Results: Basic Metabolic Panel:  Recent Labs Lab 09/02/13 1440  NA 137  K 4.3  CL 87*  CO2 >45*  GLUCOSE 181*  BUN 22  CREATININE 0.88  CALCIUM 9.9    Liver Function Tests:  Recent Labs Lab 09/02/13 1440  AST 19  ALT 21  ALKPHOS 66  BILITOT 0.4  PROT 6.4  ALBUMIN 3.4*   No results found for this basename: LIPASE, AMYLASE,  in the last 168 hours  Recent Labs Lab 09/02/13 1538  AMMONIA 53    CBC:  Recent Labs Lab 09/02/13 1440  WBC 9.3  NEUTROABS 7.5  HGB 11.9*  HCT 39.1  MCV 102.1*  PLT 183    Cardiac Enzymes: No results found for this basename: CKTOTAL, CKMB, CKMBINDEX, TROPONINI,  in the last 168 hours  Lipid Panel: No results found for this basename: CHOL, TRIG, HDL, CHOLHDL, VLDL, LDLCALC,  in the last 168 hours  CBG: No results found  for this basename: GLUCAP,  in the last 168 hours  Microbiology: No results found for this or any previous visit.  Coagulation Studies: No results found for this basename: LABPROT, INR,  in the last 72 hours  Imaging: Ct Head Wo Contrast  09/02/2013   CLINICAL DATA:  Headache.  Vertigo.  Hypertension.  EXAM: CT HEAD WITHOUT CONTRAST  TECHNIQUE: Contiguous axial images were obtained from the base of the skull through the vertex without intravenous contrast.  COMPARISON:  05/18/2011 MR. 05/17/2011 CT.  FINDINGS: No intracranial hemorrhage.  Mild small vessel disease type changes. No CT evidence of large acute infarct. If posterior fossa infarct is of high clinical concern, MR imaging may be considered.  No intracranial mass lesion noted on this unenhanced exam.  No hydrocephalus.  Vascular calcifications.  Mild mucosal thickening inferior aspect right maxillary sinus and right ethmoid sinus air cells.  IMPRESSION: No intracranial hemorrhage or CT evidence of large acute infarct.  Please see above.   Electronically Signed   By: Chauncey Cruel M.D.   On: 09/02/2013 15:26   Dg Chest Port 1 View  09/02/2013   CLINICAL DATA:  Tremors and chest pain ; history of left pneumonectomy  EXAM: PORTABLE CHEST - 1 VIEW  COMPARISON:  DG CHEST 2 VIEW dated 05/13/2013  FINDINGS: There is opacification of most of the left hemithorax which is stable allowing for differences in exposure technique. The right lung is hyperinflated. The interstitial markings are more prominent throughout the right lung today than on the previous study. The left heart border is obscured. The patient has undergone previous median sternotomy and CABG. The pulmonary vascularity on the right is prominent.  IMPRESSION: The findings suggest pulmonary vascular congestion involving the remaining right lung. This may be of cardiac or noncardiac calls. Post pneumonectomy changes on the left are stable.   Electronically Signed   By: David  Martinique   On:  09/02/2013 15:25       Assessment and plan per attending neurologist  [name] PA-C [facility] [phone]  09/02/2013, 5:09 PM   Assessment/Plan: 80 YO male with 2 day history of increasing negative myoclonic activity in both arms and legs. In the setting of elevated PCo2 and decline in O2 saturations, his repiratory status is a possible cause of myoclonus.  AST,ALT Cr, BUN and Ammonia are normal.   May consider Keppra 500 mg BID for symptomatic relief in addition to treating underlying respiratory issue.   No recent medication adjustments to suggest cause. Will check TSH as well.   Recommend: 1) Would start with treatment of elevated CO2.  2) If movements continue, could start keppra 500mg  BID 3) TSH 4) will continue to follow.   Roland Rack, MD [facility] [phone]  If 7pm- 7am, please page neurology on call as listed in [place].

## 2013-09-02 NOTE — Progress Notes (Signed)
Placed pt on nasal cpap for rest, autotitration mode 5-20cm h2o with 3l o2 bleedin.  Sterile water added to humidity chamber.  HR64, sats 95%.  RN notified.

## 2013-09-03 DIAGNOSIS — D151 Benign neoplasm of heart: Secondary | ICD-10-CM

## 2013-09-03 DIAGNOSIS — Z902 Acquired absence of lung [part of]: Secondary | ICD-10-CM

## 2013-09-03 DIAGNOSIS — I779 Disorder of arteries and arterioles, unspecified: Secondary | ICD-10-CM

## 2013-09-03 DIAGNOSIS — Z85118 Personal history of other malignant neoplasm of bronchus and lung: Secondary | ICD-10-CM

## 2013-09-03 DIAGNOSIS — I10 Essential (primary) hypertension: Secondary | ICD-10-CM

## 2013-09-03 DIAGNOSIS — I2510 Atherosclerotic heart disease of native coronary artery without angina pectoris: Secondary | ICD-10-CM

## 2013-09-03 DIAGNOSIS — Z9889 Other specified postprocedural states: Secondary | ICD-10-CM

## 2013-09-03 DIAGNOSIS — E781 Pure hyperglyceridemia: Secondary | ICD-10-CM

## 2013-09-03 DIAGNOSIS — R079 Chest pain, unspecified: Secondary | ICD-10-CM

## 2013-09-03 DIAGNOSIS — G253 Myoclonus: Secondary | ICD-10-CM

## 2013-09-03 DIAGNOSIS — J449 Chronic obstructive pulmonary disease, unspecified: Secondary | ICD-10-CM | POA: Diagnosis present

## 2013-09-03 LAB — BASIC METABOLIC PANEL
BUN: 17 mg/dL (ref 6–23)
CO2: 42 mEq/L (ref 19–32)
Calcium: 9.9 mg/dL (ref 8.4–10.5)
Chloride: 87 mEq/L — ABNORMAL LOW (ref 96–112)
Creatinine, Ser: 0.89 mg/dL (ref 0.50–1.35)
GFR, EST NON AFRICAN AMERICAN: 80 mL/min — AB (ref >90)
Glucose, Bld: 123 mg/dL — ABNORMAL HIGH (ref 70–99)
Potassium: 3.8 mEq/L (ref 3.7–5.3)
SODIUM: 134 meq/L — AB (ref 137–147)

## 2013-09-03 LAB — MRSA PCR SCREENING: MRSA by PCR: NEGATIVE

## 2013-09-03 LAB — BLOOD GAS, ARTERIAL
Acid-Base Excess: 14 mmol/L — ABNORMAL HIGH (ref 0.0–2.0)
BICARBONATE: 40.5 meq/L — AB (ref 20.0–24.0)
Drawn by: 281201
O2 Content: 2 L/min
O2 Saturation: 94.4 %
PCO2 ART: 79.5 mmHg — AB (ref 35.0–45.0)
Patient temperature: 98.6
TCO2: 42.9 mmol/L (ref 0–100)
pH, Arterial: 7.327 — ABNORMAL LOW (ref 7.350–7.450)
pO2, Arterial: 76.7 mmHg — ABNORMAL LOW (ref 80.0–100.0)

## 2013-09-03 LAB — CBC
HCT: 40.9 % (ref 39.0–52.0)
Hemoglobin: 12.3 g/dL — ABNORMAL LOW (ref 13.0–17.0)
MCH: 30.9 pg (ref 26.0–34.0)
MCHC: 30.1 g/dL (ref 30.0–36.0)
MCV: 102.8 fL — ABNORMAL HIGH (ref 78.0–100.0)
Platelets: 181 10*3/uL (ref 150–400)
RBC: 3.98 MIL/uL — ABNORMAL LOW (ref 4.22–5.81)
RDW: 13.7 % (ref 11.5–15.5)
WBC: 8.1 10*3/uL (ref 4.0–10.5)

## 2013-09-03 LAB — PROTIME-INR
INR: 1.01 (ref 0.00–1.49)
PROTHROMBIN TIME: 13.1 s (ref 11.6–15.2)

## 2013-09-03 LAB — TROPONIN I

## 2013-09-03 MED ORDER — BIOTENE DRY MOUTH MT LIQD
15.0000 mL | Freq: Two times a day (BID) | OROMUCOSAL | Status: DC
  Administered 2013-09-03 – 2013-09-06 (×5): 15 mL via OROMUCOSAL

## 2013-09-03 MED ORDER — ONDANSETRON HCL 4 MG/2ML IJ SOLN
4.0000 mg | Freq: Four times a day (QID) | INTRAMUSCULAR | Status: DC | PRN
  Administered 2013-09-03 – 2013-09-06 (×4): 4 mg via INTRAVENOUS
  Filled 2013-09-03 (×4): qty 2

## 2013-09-03 NOTE — Consult Note (Signed)
Patient Name: Calvin [name] Date of Encounter: 09/03/2013  Primary Care Provider:   [name], MD Primary Cardiologist:  Dr Nigel Berthold List   Past Medical History  Diagnosis Date  . HTN (hypertension)   . History of lung cancer   . Hypertriglyceridemia   . CAD (coronary artery disease)   . Atrial myxoma     Left  . Carotid artery disease   . Hearing loss   . Diabetes mellitus   . Arthritis    Past Surgical History  Procedure Laterality Date  . Cabg x 1 with reverse saphenous vein graft to distal rt coronary artery and excision of left atrial myxoma  07/12/2005    Dr Servando Snare  . Left pneumonectomy  05/03/1998    Dr Servando Snare  . Combined mediastinoscopy and bronchoscopy  05/01/1998    Dr Servando Snare  . Rt  carotid endarterectomy      Dr Kellie Simmering  . Coronary artery bypass graft    . Carotid endarterectomy  2007    Right CEA  . Lung removal, partial  2000    Left lung removed     Allergies  Allergies  Allergen Reactions  . Statins Other (See Comments)    Muscle aches  . Amoxicillin Rash    HPI  80 year old male with history of CAD, s/p CABG x 1 (SVG to RCA) and excision of left atrial myxoma in 2007, s/p Rt  carotid endarterectomy, h/o sq cell cancer and s/p left pneumonectomy.  The patient is O2 dependent at home and usually has Spo2 of 92-93 while on oxygen. He has RLS at night but no myoclonus or tremor noted by wife. He is bale to ambulate without assistance for short distances but will often need to sit down due to drop in o2 saturations (at times down to 70's).  Over the last few days he has noted to eat less and started to have UE and LE "jerking" that occurs intermittent and most notable when he holds a posture. He was found to have chronic hypercapnia on ABG. PCCM asked to assess respiratory failure.  He also admitted to have occasional chest pain that is sharp, retrosternal, related to certain position changes and lasting minutes. It is not associated  with  SOB or palpitations. No radiation to the back, arms or jaw. He denied any resting chest pain. No need to use NTG. Denies lower extremity edema.  Home Medications  Prior to Admission medications   Medication Sig Start Date End Date Taking? Authorizing Provider  acetaminophen (TYLENOL) 325 MG tablet Take 650 mg by mouth every 6 (six) hours as needed. For pain   Yes Historical Provider, MD  albuterol (PROVENTIL HFA;VENTOLIN HFA) 108 (90 BASE) MCG/ACT inhaler Inhale 2 puffs into the lungs every 6 (six) hours as needed for wheezing.   Yes Historical Provider, MD  BYSTOLIC 10 MG tablet TAKE 1 TABLET DAILY   Yes Jettie Booze, MD  cetirizine (ZYRTEC) 10 MG tablet Take 10 mg by mouth daily as needed. For allergies   Yes Historical Provider, MD  cholecalciferol (VITAMIN D) 1000 UNITS tablet Take 2,000 Units by mouth 2 (two) times daily.    Yes Historical Provider, MD  clopidogrel (PLAVIX) 75 MG tablet Take 75 mg by mouth daily.   Yes Historical Provider, MD  felodipine (PLENDIL) 10 MG 24 hr tablet TAKE 1 TABLET ONCE A DAY 06/07/13  Yes Jettie Booze, MD  losartan-hydrochlorothiazide (HYZAAR) 100-25 MG per tablet TAKE  1 TABLET DAILY 03/09/13  Yes Jettie Booze, MD  magnesium oxide (MAG-OX) 400 MG tablet Take 400 mg by mouth daily.   Yes Historical Provider, MD  Multiple Vitamins-Minerals (MULTIVITAMINS THER. W/MINERALS) TABS Take 1 tablet by mouth daily.   Yes Historical Provider, MD  Omega-3 Fatty Acids (FISH OIL) 1200 MG CAPS Take 1 capsule by mouth 2 (two) times daily.   Yes Historical Provider, MD  EPIPEN 2-PAK 0.3 MG/0.3ML DEVI Inject 0.3 mg into the skin See admin instructions. Take as needed for severe allergic reaction 11/04/11   Historical Provider, MD    Family History  Family History  Problem Relation Age of Onset  . Heart disease Mother   . Heart disease Sister   . Stomach cancer Father     Social History  History   Social History  . Marital Status: Married     Spouse Name: N/A    Number of Children: N/A  . Years of Education: N/A   Occupational History  . Not on file.   Social History Main Topics  . Smoking status: Former Smoker    Types: Cigarettes    Quit date: 07/21/1997  . Smokeless tobacco: Former Systems developer  . Alcohol Use: No  . Drug Use: No  . Sexual Activity: Not Currently   Other Topics Concern  . Not on file   Social History Narrative  . No narrative on file     Review of Systems, as per HPI, otherwise negative General:  No chills, fever, night sweats or weight changes.  Cardiovascular:  No chest pain, dyspnea on exertion, edema, orthopnea, palpitations, paroxysmal nocturnal dyspnea. Dermatological: No rash, lesions/masses Respiratory: No cough, dyspnea Urologic: No hematuria, dysuria Abdominal:   No nausea, vomiting, diarrhea, bright red blood per rectum, melena, or hematemesis Neurologic:  No visual changes, wkns, changes in mental status. All other systems reviewed and are otherwise negative except as noted above.  Physical Exam  Blood pressure 133/69, pulse 68, temperature 98.2 F (36.8 C), temperature source Oral, resp. rate 20, height 5\' 11"  (1.803 m), weight 217 lb (98.431 kg), SpO2 94.00%.  General: Pleasant, NAD Psych: Normal affect. Neuro: Alert and oriented X 3. Moves all extremities spontaneously. HEENT: Normal  Neck: Supple without bruits or JVD. Lungs:  Resp regular and unlabored, rales at the right base, diminished BS on the left. Heart: RRR no s3, s4, or murmurs. Abdomen: Soft, non-tender, non-distended, BS + x 4.  Extremities: No clubbing, cyanosis or edema. DP/PT/Radials 2+ and equal bilaterally.  Labs:  Recent Labs  09/02/13 1652 09/02/13 2229 09/03/13 0446  TROPONINI <0.30 <0.30 <0.30   Lab Results  Component Value Date   WBC 8.1 09/03/2013   HGB 12.3* 09/03/2013   HCT 40.9 09/03/2013   MCV 102.8* 09/03/2013   PLT 181 09/03/2013    Lab Results  Component Value Date   DDIMER  Value: 2.70         AT THE INHOUSE ESTABLISHED CUTOFF VALUE OF 0.48 ug/mL FEU, THIS ASSAY HAS BEEN DOCUMENTED IN THE LITERATURE TO HAVE A SENSITIVITY AND NEGATIVE PREDICTIVE VALUE OF AT LEAST 98 TO 99%.  THE TEST RESULT SHOULD BE CORRELATED WITH AN ASSESSMENT OF THE CLINICAL PROBABILITY OF DVT / VTE.* 01/19/2010   No components found with this basename: POCBNP,     Component Value Date/Time   NA 134* 09/03/2013 0446   K 3.8 09/03/2013 0446   CL 87* 09/03/2013 0446   CO2 42* 09/03/2013 0446   GLUCOSE 123* 09/03/2013 0446  BUN 17 09/03/2013 0446   CREATININE 0.89 09/03/2013 0446   CALCIUM 9.9 09/03/2013 0446   PROT 6.4 09/02/2013 1440   ALBUMIN 3.4* 09/02/2013 1440   AST 19 09/02/2013 1440   ALT 21 09/02/2013 1440   ALKPHOS 66 09/02/2013 1440   BILITOT 0.4 09/02/2013 1440   GFRNONAA 80* 09/03/2013 0446   GFRAA >90 09/03/2013 0446   Lab Results  Component Value Date   CHOL 238* 05/18/2011   HDL 36* 05/18/2011   LDLCALC 144* 05/18/2011   TRIG 291* 05/18/2011   Accessory Clinical Findings  Echocardiogram - 05/17/2013 Left ventricle: The cavity size was normal. Systolic function was normal. The estimated ejection fraction was in the range of 55% to 60%. - Left atrium: There is no evidence of left atrial myxoma. Impressions: - No cardiac source of emboli was indentified.  ECG - NSR, normal ECG  Assessment & Plan  80 year old male with history of CAD, s/p CABG x 1 (SVG to RCA) and excision of left atrial myxoma in 2007, s/p Rt  carotid endarterectomy, h/o sq cell cancer and s/p left pneumonectomy admitted with hypercapnic respiratory failure associated with myoclonus.  His chest pain sounds very atypical and appears that it might be secondary to adhesions from pneumonectomy. His ECG is completely normal and cardiac enzymes are negative.  With baseline SpO2 92% with one lung only we would be really hesitant to proceed with Lexiscan (unable to walk on treadmill) for this atypical pain. I don't feel that any further  ischemic work-up is necessary at this point.    Thank you for the consultation, please don't hesitate to call us with any questions.   Dorothy Spark, MD, [facility] 09/03/2013, 8:23 AM

## 2013-09-03 NOTE — Progress Notes (Signed)
Subjective:  His asterixis has significantly decreased over night . HE did wear the  Bipap however "dislikes the sensation it gave him--felt his lungs were going to explode."  Objective: Current vital signs: BP 133/69  Pulse 68  Temp(Src) 98.2 F (36.8 C) (Oral)  Resp 20  Ht 5\' 11"  (1.803 m)  Wt 98.431 kg (217 lb)  BMI 30.28 kg/m2  SpO2 94% Vital signs in last 24 hours: Temp:  [98.1 F (36.7 C)-99.3 F (37.4 C)] 98.2 F (36.8 C) (05/15 0503) Pulse Rate:  [68-89] 68 (05/15 0503) Resp:  [18-20] 20 (05/15 0503) BP: (82-133)/(47-80) 133/69 mmHg (05/15 0503) SpO2:  [89 %-94 %] 94 % (05/15 0503) Weight:  [98.431 kg (217 lb)-99.791 kg (220 lb)] 98.431 kg (217 lb) (05/14 1738)  Intake/Output from previous day: 05/14 0701 - 05/15 0700 In: -  Out: 350 [Urine:350] Intake/Output this shift:   Nutritional status: General  Neurologic Exam: Mental Status:  Alert, oriented, thought content appropriate. Speech fluent without evidence of aphasia. Able to follow 3 step commands without difficulty.  Cranial Nerves:  II: Discs flat bilaterally; Visual fields grossly normal, pupils Right 40mm and left 38mm, round, reactive to light and accommodation  III,IV, VI: ptosis not present, extra-ocular motions intact bilaterally  V,VII: smile symmetric, facial light touch sensation normal bilaterally  VIII: hearing decreased bilaterally  IX,X: gag reflex present  XI: bilateral shoulder shrug  XII: midline tongue extension without atrophy or fasciculations  Motor:  Right : Upper extremity 5/5  Left:  Upper extremity 5/5   Lower extremity 5/5   Lower extremity 5/5  Only very minimal asterixis today  Tone and bulk:normal tone throughout; no atrophy noted  Sensory: Pinprick and light touch intact throughout, bilaterally  Deep Tendon Reflexes:  2+ throughout UE and KJ and 1+ AJ Plantars:  Right: downgoing  Left: downgoing       Lab Results: Basic Metabolic Panel:  Recent Labs Lab  09/02/13 1440 09/02/13 1630 09/03/13 0446  NA 137  --  134*  K 4.3  --  3.8  CL 87*  --  87*  CO2 >45*  --  42*  GLUCOSE 181*  --  123*  BUN 22  --  17  CREATININE 0.88  --  0.89  CALCIUM 9.9  --  9.9  MG  --  2.2  --     Liver Function Tests:  Recent Labs Lab 09/02/13 1440  AST 19  ALT 21  ALKPHOS 66  BILITOT 0.4  PROT 6.4  ALBUMIN 3.4*   No results found for this basename: LIPASE, AMYLASE,  in the last 168 hours  Recent Labs Lab 09/02/13 1538  AMMONIA 53    CBC:  Recent Labs Lab 09/02/13 1440 09/03/13 0446  WBC 9.3 8.1  NEUTROABS 7.5  --   HGB 11.9* 12.3*  HCT 39.1 40.9  MCV 102.1* 102.8*  PLT 183 181    Cardiac Enzymes:  Recent Labs Lab 09/02/13 1652 09/02/13 2229 09/03/13 0446  TROPONINI <0.30 <0.30 <0.30    Lipid Panel: No results found for this basename: CHOL, TRIG, HDL, CHOLHDL, VLDL, LDLCALC,  in the last 168 hours  CBG: No results found for this basename: GLUCAP,  in the last 168 hours  Microbiology: No results found for this or any previous visit.  Coagulation Studies:  Recent Labs  09/03/13 0655  LABPROT 13.1  INR 1.01    Imaging: Ct Head Wo Contrast  09/02/2013   CLINICAL DATA:  Headache.  Vertigo.  Hypertension.  EXAM: CT HEAD WITHOUT CONTRAST  TECHNIQUE: Contiguous axial images were obtained from the base of the skull through the vertex without intravenous contrast.  COMPARISON:  05/18/2011 MR. 05/17/2011 CT.  FINDINGS: No intracranial hemorrhage.  Mild small vessel disease type changes. No CT evidence of large acute infarct. If posterior fossa infarct is of high clinical concern, MR imaging may be considered.  No intracranial mass lesion noted on this unenhanced exam.  No hydrocephalus.  Vascular calcifications.  Mild mucosal thickening inferior aspect right maxillary sinus and right ethmoid sinus air cells.  IMPRESSION: No intracranial hemorrhage or CT evidence of large acute infarct.  Please see above.   Electronically  Signed   By: Chauncey Cruel M.D.   On: 09/02/2013 15:26   Dg Chest Port 1 View  09/02/2013   CLINICAL DATA:  Tremors and chest pain ; history of left pneumonectomy  EXAM: PORTABLE CHEST - 1 VIEW  COMPARISON:  DG CHEST 2 VIEW dated 05/13/2013  FINDINGS: There is opacification of most of the left hemithorax which is stable allowing for differences in exposure technique. The right lung is hyperinflated. The interstitial markings are more prominent throughout the right lung today than on the previous study. The left heart border is obscured. The patient has undergone previous median sternotomy and CABG. The pulmonary vascularity on the right is prominent.  IMPRESSION: The findings suggest pulmonary vascular congestion involving the remaining right lung. This may be of cardiac or noncardiac calls. Post pneumonectomy changes on the left are stable.   Electronically Signed   By: David  Martinique   On: 09/02/2013 15:25    Medications:  Scheduled: . acetaZOLAMIDE  250 mg Oral BID  . antiseptic oral rinse  15 mL Mouth Rinse BID  . cholecalciferol  2,000 Units Oral BID  . clopidogrel  75 mg Oral Daily  . heparin  5,000 Units Subcutaneous 3 times per day  . ipratropium-albuterol  3 mL Nebulization Q6H WA  . nebivolol  10 mg Oral Daily  . sodium chloride  3 mL Intravenous Q12H    Assessment/Plan: 80 YO male with 2 day history of increasing negative myoclonic activity in both arms and legs. In the setting of elevated PCo2 and decline in O2 saturations. TSH and Ammonia normal.  Asterixis significantly improved today after use of Bipap last night.    Recommend: 1) adjusting underlying respiratory issues --PCCM now involved.   No further recommendations per neurology .  Will S/O  [name] PA-C [facility] [phone]  09/03/2013, 10:04 AM

## 2013-09-03 NOTE — Progress Notes (Signed)
[facility] PROGRESS NOTE  Calvin [name] YNW:[id number] DOB: 06/05/1932 DOA: 09/02/2013 PCP:  [name], MD  Assessment/Plan: 80 y/o male with PMH of L pneumonectomy sq cell Ca 2000 chronic res failure/ prior 40 pack/yr smoker-now O2 dependent for the past 6-8 months with 2 L at rest, Diet-controlled diabetes mellitus (off metformin for the past 6-8 months with sugars ranging 106-120) , critical carotid artery disease R sdie s/p Endarterectomy 06/2005, L atrial myxoma s/p excision 06/2005, P a flutter, CABG x 1 06/2005, Admission 05/17/11 L sided weakness + numbness=TIA,  Restless leg syndrome for the past year and has been on chronic magnesium for the past came to the hospital is that over the past 2-3 days patient has been having sudden episodes of jerking movements of upper and lower extremities without unilateral weakness found to have in acute on chronic resp failure   1. "-"Myoclonus thought likely due to chronic res failure, CO2 retention, hypoxia -neuro exam no focal; CT: No intracranial hemorrhage or CT evidence of large acute infarct  -no new symptoms; ? improving on BiPAP; per neurology no further evaluation   2. Acute on chronic combined res failure; s/p pneumonectomy (lung Ca); COPD on home oxygen -bicarb improved on acetazolamide+IVF; per pulmonology for chronic CO2 retention;  -TF to SDU for BiPAP; cont  BiPAP; c/s CM, DME fo bipap  3. CAD (coronary artery disease)-see above discussion. Continue Plavix continue other medications -chest pain on admission, resolved; per cardiology likely atypical;    Code Status: full Family Communication:  D/w patient, wife, sister  (indicate person spoken with, relationship, and if by phone, the number) Disposition Plan: home 24-48 hours    Consultants:  Pulmonology  Cardiology]  Neurology     Procedures:  BiPAP  Antibiotics:  none (indicate start date, and stop date if known)  HPI/Subjective: alert  Objective: Filed  Vitals:   09/03/13 1049  BP: 140/64  Pulse: 70  Temp:   Resp:     Intake/Output Summary (Last 24 hours) at 09/03/13 1154 Last data filed at 09/03/13 0848  Gross per 24 hour  Intake    240 ml  Output    350 ml  Net   -110 ml   Filed Weights   09/02/13 1532 09/02/13 1738  Weight: 99.791 kg (220 lb) 98.431 kg (217 lb)    Exam:   General:  alert  Cardiovascular: s1,s2 rrr  Respiratory: no wheezing on R  Abdomen: soft, nt,nd   Musculoskeletal: no LE edema   Data Reviewed: Basic Metabolic Panel:  Recent Labs Lab 09/02/13 1440 09/02/13 1630 09/03/13 0446  NA 137  --  134*  K 4.3  --  3.8  CL 87*  --  87*  CO2 >45*  --  42*  GLUCOSE 181*  --  123*  BUN 22  --  17  CREATININE 0.88  --  0.89  CALCIUM 9.9  --  9.9  MG  --  2.2  --    Liver Function Tests:  Recent Labs Lab 09/02/13 1440  AST 19  ALT 21  ALKPHOS 66  BILITOT 0.4  PROT 6.4  ALBUMIN 3.4*   No results found for this basename: LIPASE, AMYLASE,  in the last 168 hours  Recent Labs Lab 09/02/13 1538  AMMONIA 53   CBC:  Recent Labs Lab 09/02/13 1440 09/03/13 0446  WBC 9.3 8.1  NEUTROABS 7.5  --   HGB 11.9* 12.3*  HCT 39.1 40.9  MCV 102.1* 102.8*  PLT 183  181   Cardiac Enzymes:  Recent Labs Lab 09/02/13 1652 09/02/13 2229 09/03/13 0446  TROPONINI <0.30 <0.30 <0.30   BNP (last 3 results) No results found for this basename: PROBNP,  in the last 8760 hours CBG: No results found for this basename: GLUCAP,  in the last 168 hours  No results found for this or any previous visit (from the past 240 hour(s)).   Studies: Ct Head Wo Contrast  09/02/2013   CLINICAL DATA:  Headache.  Vertigo.  Hypertension.  EXAM: CT HEAD WITHOUT CONTRAST  TECHNIQUE: Contiguous axial images were obtained from the base of the skull through the vertex without intravenous contrast.  COMPARISON:  05/18/2011 MR. 05/17/2011 CT.  FINDINGS: No intracranial hemorrhage.  Mild small vessel disease type changes.  No CT evidence of large acute infarct. If posterior fossa infarct is of high clinical concern, MR imaging may be considered.  No intracranial mass lesion noted on this unenhanced exam.  No hydrocephalus.  Vascular calcifications.  Mild mucosal thickening inferior aspect right maxillary sinus and right ethmoid sinus air cells.  IMPRESSION: No intracranial hemorrhage or CT evidence of large acute infarct.  Please see above.   Electronically Signed   By: Chauncey Cruel M.D.   On: 09/02/2013 15:26   Dg Chest Port 1 View  09/02/2013   CLINICAL DATA:  Tremors and chest pain ; history of left pneumonectomy  EXAM: PORTABLE CHEST - 1 VIEW  COMPARISON:  DG CHEST 2 VIEW dated 05/13/2013  FINDINGS: There is opacification of most of the left hemithorax which is stable allowing for differences in exposure technique. The right lung is hyperinflated. The interstitial markings are more prominent throughout the right lung today than on the previous study. The left heart border is obscured. The patient has undergone previous median sternotomy and CABG. The pulmonary vascularity on the right is prominent.  IMPRESSION: The findings suggest pulmonary vascular congestion involving the remaining right lung. This may be of cardiac or noncardiac calls. Post pneumonectomy changes on the left are stable.   Electronically Signed   By: David  Martinique   On: 09/02/2013 15:25    Scheduled Meds: . acetaZOLAMIDE  250 mg Oral BID  . antiseptic oral rinse  15 mL Mouth Rinse BID  . cholecalciferol  2,000 Units Oral BID  . clopidogrel  75 mg Oral Daily  . heparin  5,000 Units Subcutaneous 3 times per day  . ipratropium-albuterol  3 mL Nebulization Q6H WA  . nebivolol  10 mg Oral Daily  . sodium chloride  3 mL Intravenous Q12H   Continuous Infusions: . sodium chloride 75 mL/hr at 09/02/13 2213    Principal Problem:   Chest pain Active Problems:   HTN (hypertension)   History of lung cancer   Hypertriglyceridemia   CAD (coronary  artery disease)   Atrial myxoma   Occlusion and stenosis of carotid artery without mention of cerebral infarction   Movement disorder   Metabolic alkalosis   Acute-on-chronic respiratory failure   COPD (chronic obstructive pulmonary disease)   Hx of pneumonectomy    Time spent: >35 minutes     Kinnie Feil  [facility] Pager [phone]. If 7PM-7AM, please contact night-coverage at [email or web address], password [facility] 09/03/2013, 11:54 AM  LOS: 1 day

## 2013-09-03 NOTE — Progress Notes (Signed)
UR completed. Patient changed to inpatient- requiring bipap at night

## 2013-09-03 NOTE — Care Management Note (Addendum)
    Page 1 of 2   09/06/2013     2:09:31 PM CARE MANAGEMENT NOTE 09/06/2013  Patient:  Calvin [name], Calvin [name]   Account Number:  [id number]  Date Initiated:  09/03/2013  Documentation initiated by:  Elissa Hefty  Subjective/Objective Assessment:   adm w resp failure     Action/Plan:   lives w wife, pcp dr [name]   Anticipated DC Date:  09/06/2013   Anticipated DC Plan:  [place]  CM consult      Choice offered to / List presented to:     DME arranged  [facility]      DME agency  [facility].        Status of service:   Medicare Important Message given?   (If response is "NO", the following Medicare IM given date fields will be blank) Date Medicare IM given:   Date Additional Medicare IM given:    Discharge Disposition:  HOME/SELF CARE  Per UR Regulation:  Reviewed for med. necessity/level of care/duration of stay  If discussed at [place] of Stay Meetings, dates discussed:   09/07/2013    Comments:  5/18  1407 debbie Zayde Stroupe rn,bsn pulmonologist has dc bipap and pt will not need at disch. have alerted jermaine w [facility]. pt will still be using home o2 which he gets from [facility]. jermaine to bring portable to room for pt to use to go home. wife forgot his portable.  5/15 1518 debbie Corrisa Gibby rn,bsn per [facility] dme rep pt will be set up for bipap at disch. medicare will not cover bipap until sleep study done. have placed sleep study form on shadow chart for md to sign. explained above to pt and wife also.  5/15 1416p debbie Jonaven Hilgers rn,bsn pt and wife in room. pt uses [facility] for home oxygen. have alerted jermaine rep w [facility] who is starting to work on hom bipap. pt has overnite oximetry w [facility] in past. will cont to follow.

## 2013-09-03 NOTE — Progress Notes (Signed)
Pt transferred to [place] per MD order. Report given to Chat, Therapist, sports. All belongings sent with pt.  Calvin [name]

## 2013-09-03 NOTE — Progress Notes (Signed)
Pt. desats on exertion., assistance given. On bedrest -sat- 91%. On 02 2L [place].

## 2013-09-03 NOTE — Progress Notes (Signed)
PULMONARY / CRITICAL CARE MEDICINE  Name: Calvin [name] MRN: [id number] DOB: 1933/02/03    ADMISSION DATE:  09/02/2013 CONSULTATION DATE:  09/02/2013  REFERRING MD :  Verneita Griffes  CHIEF COMPLAINT:  Abnormal blood gas  BRIEF PATIENT DESCRIPTION:  80 yo male with COPD, L pneumonectomy secondary to [place] and chronic respiratory failure on supplemental oxygen who presented with jerking limb movements, headaches and chest pain. He was found to have chronic hypercapnia on ABG.  PCCM asked to assess respiratory failure.  SIGNIFICANT EVENTS: 5/14 Admited, Neurology / Pulmonary consulted  STUDIES:  5/14 CT head >> no acute findings  LINES / TUBES:  CULTURES:  ANTIBIOTICS:  SUBJECTIVE:  No dyspnea. Intolerant of CPAP.   VITAL SIGNS: Temp:  [98.1 F (36.7 C)-99.3 F (37.4 C)] 98.2 F (36.8 C) (05/15 0503) Pulse Rate:  [68-89] 68 (05/15 0503) Resp:  [18-20] 20 (05/15 0503) BP: (82-133)/(47-80) 133/69 mmHg (05/15 0503) SpO2:  [89 %-94 %] 94 % (05/15 0503) Weight:  [98.431 kg (217 lb)-99.791 kg (220 lb)] 98.431 kg (217 lb) (05/14 1738)  PHYSICAL EXAMINATION: General: no distress, wearing oxygen, refuses cpap Neuro:  Awake, difficulty with hearing, intermittently follows commands, normal strength, CN intact, no myoclonus noted HEENT:  Pupils reactive, no sinus tenderness, MP 3, no LAN Cardiovascular:  Regular, no murmur Lungs:  Scattered wheeze/rhonchi on Rt, absent breath sounds on Lt Abdomen:  Soft, non tender, normal bowel sounds Musculoskeletal:  No edema Skin:  No rashes  LABS: CBC  Recent Labs Lab 09/02/13 1440 09/03/13 0446  WBC 9.3 8.1  HGB 11.9* 12.3*  HCT 39.1 40.9  PLT 183 181   Coag's  Recent Labs Lab 09/03/13 0655  INR 1.01   BMET  Recent Labs Lab 09/02/13 1440 09/03/13 0446  NA 137 134*  K 4.3 3.8  CL 87* 87*  CO2 >45* 42*  BUN 22 17  CREATININE 0.88 0.89  GLUCOSE 181* 123*   Electrolytes  Recent Labs Lab 09/02/13 1440 09/02/13 1630  09/03/13 0446  CALCIUM 9.9  --  9.9  MG  --  2.2  --    Sepsis Markers No results found for this basename: LATICACIDVEN, PROCALCITON, O2SATVEN,  in the last 168 hours  ABG  Recent Labs Lab 09/02/13 1627  PHART 7.347*  PCO2ART 94.1*  PO2ART 66.0*   Liver Enzymes  Recent Labs Lab 09/02/13 1440  AST 19  ALT 21  ALKPHOS 66  BILITOT 0.4  ALBUMIN 3.4*   Cardiac Enzymes  Recent Labs Lab 09/02/13 1652 09/02/13 2229 09/03/13 0446  TROPONINI <0.30 <0.30 <0.30   Glucose No results found for this basename: GLUCAP,  in the last 168 hours  IMAGING:   Ct Head Wo Contrast  09/02/2013   CLINICAL DATA:  Headache.  Vertigo.  Hypertension.  EXAM: CT HEAD WITHOUT CONTRAST  TECHNIQUE: Contiguous axial images were obtained from the base of the skull through the vertex without intravenous contrast.  COMPARISON:  05/18/2011 MR. 05/17/2011 CT.  FINDINGS: No intracranial hemorrhage.  Mild small vessel disease type changes. No CT evidence of large acute infarct. If posterior fossa infarct is of high clinical concern, MR imaging may be considered.  No intracranial mass lesion noted on this unenhanced exam.  No hydrocephalus.  Vascular calcifications.  Mild mucosal thickening inferior aspect right maxillary sinus and right ethmoid sinus air cells.  IMPRESSION: No intracranial hemorrhage or CT evidence of large acute infarct.  Please see above.   Electronically Signed   By: Mignon Pine.D.  On: 09/02/2013 15:26   Dg Chest Port 1 View  09/02/2013   CLINICAL DATA:  Tremors and chest pain ; history of left pneumonectomy  EXAM: PORTABLE CHEST - 1 VIEW  COMPARISON:  DG CHEST 2 VIEW dated 05/13/2013  FINDINGS: There is opacification of most of the left hemithorax which is stable allowing for differences in exposure technique. The right lung is hyperinflated. The interstitial markings are more prominent throughout the right lung today than on the previous study. The left heart border is obscured. The  patient has undergone previous median sternotomy and CABG. The pulmonary vascularity on the right is prominent.  IMPRESSION: The findings suggest pulmonary vascular congestion involving the remaining right lung. This may be of cardiac or noncardiac calls. Post pneumonectomy changes on the left are stable.   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 The patient can qualify for outpatient equipment based on chronic failure / hypercarbia, however his compliance can  limit the benefit of this intervention.  Richardson Landry Minor ACNP Maryanna Shape PCCM Pager [phone] till 3 pm If no answer page [phone] 09/03/2013, 9:46 AM  I have personally obtained history, examined patient, evaluated and interpreted laboratory and imaging results, reviewed medical records, formulated assessment / plan and placed orders.  Doree Fudge, MD Pulmonary and [place] Pager: [phone]  09/03/2013, 10:47 AM

## 2013-09-04 DIAGNOSIS — G259 Extrapyramidal and movement disorder, unspecified: Secondary | ICD-10-CM

## 2013-09-04 DIAGNOSIS — I10 Essential (primary) hypertension: Secondary | ICD-10-CM

## 2013-09-04 LAB — POCT I-STAT 3, ART BLOOD GAS (G3+)
Acid-Base Excess: 10 mmol/L — ABNORMAL HIGH (ref 0.0–2.0)
Bicarbonate: 41.8 mEq/L — ABNORMAL HIGH (ref 20.0–24.0)
O2 SAT: 84 %
PCO2 ART: 98 mmHg — AB (ref 35.0–45.0)
PH ART: 7.238 — AB (ref 7.350–7.450)
PO2 ART: 62 mmHg — AB (ref 80.0–100.0)
Patient temperature: 98.6
TCO2: 45 mmol/L (ref 0–100)

## 2013-09-04 LAB — BASIC METABOLIC PANEL
BUN: 18 mg/dL (ref 6–23)
CO2: 37 mEq/L — ABNORMAL HIGH (ref 19–32)
Calcium: 10 mg/dL (ref 8.4–10.5)
Chloride: 92 mEq/L — ABNORMAL LOW (ref 96–112)
Creatinine, Ser: 1.12 mg/dL (ref 0.50–1.35)
GFR, EST AFRICAN AMERICAN: 71 mL/min — AB (ref >90)
GFR, EST NON AFRICAN AMERICAN: 61 mL/min — AB (ref >90)
Glucose, Bld: 118 mg/dL — ABNORMAL HIGH (ref 70–99)
POTASSIUM: 4 meq/L (ref 3.7–5.3)
SODIUM: 137 meq/L (ref 137–147)

## 2013-09-04 MED ORDER — IPRATROPIUM-ALBUTEROL 0.5-2.5 (3) MG/3ML IN SOLN
3.0000 mL | RESPIRATORY_TRACT | Status: DC
  Administered 2013-09-04 – 2013-09-06 (×11): 3 mL via RESPIRATORY_TRACT
  Filled 2013-09-04 (×13): qty 3

## 2013-09-04 MED ORDER — METHYLPREDNISOLONE SODIUM SUCC 40 MG IJ SOLR
40.0000 mg | Freq: Two times a day (BID) | INTRAMUSCULAR | Status: DC
  Administered 2013-09-04 – 2013-09-06 (×5): 40 mg via INTRAVENOUS
  Filled 2013-09-04 (×6): qty 1

## 2013-09-04 MED ORDER — FUROSEMIDE 10 MG/ML IJ SOLN
40.0000 mg | Freq: Once | INTRAMUSCULAR | Status: AC
  Administered 2013-09-04: 40 mg via INTRAVENOUS
  Filled 2013-09-04: qty 4

## 2013-09-04 NOTE — Progress Notes (Signed)
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 "-"Myoclonus thought likely due to chronic res failure, CO2 retention, hypoxia -neuro exam no focal; CT: No intracranial hemorrhage or CT evidence of large acute infarct  -no new symptoms; ? improving on BiPAP; per neurology no further evaluation   2. Acute on chronic combined res failure; s/p pneumonectomy (lung Ca); COPD on home oxygen -chronic CO2 retention; bicarb improved on acetazolamide+IVF+BiPaP;  -intermittent confusion, hypoxia overnight; mental status seems at the baseline in the morning;   -cont BiPAP; c/s CM, DME fo bipap; pend ABG  3. CAD (coronary artery disease)-see above discussion. Continue Plavix continue other medications -chest pain on admission, resolved; per cardiology likely atypical;   -prognosis remains poor due to chronic respiratory failure, hypoxia, CO2 retention, s/p pneumonectomy   Code Status: full Family Communication:  D/w patient, wife, sister  (indicate person spoken with, relationship, and if by phone, the number) Disposition Plan: home 24-48 hours     Consultants:  Pulmonology  Cardiology]  Neurology     Procedures:  BiPAP  Antibiotics:  none (indicate start date, and stop date if known)  HPI/Subjective: alert  Objective: Filed Vitals:   09/04/13 0725  BP: 115/37  Pulse: 69  Temp:   Resp: 14    Intake/Output Summary (Last 24 hours) at 09/04/13 0751 Last data filed at 09/04/13 0600  Gross per 24 hour  Intake    340 ml  Output   1425 ml  Net  -1085 ml   Filed Weights   09/02/13 1532 09/02/13 1738  Weight: 99.791 kg (220 lb) 98.431 kg (217 lb)    Exam:   General:  alert  Cardiovascular: s1,s2 rrr  Respiratory: no wheezing on R  Abdomen: soft, nt,nd   Musculoskeletal: no LE edema   Data Reviewed: Basic Metabolic Panel:  Recent Labs Lab 09/02/13 1440 09/02/13 1630 09/03/13 0446 09/04/13 0234  NA 137  --  134* 137  K 4.3  --  3.8 4.0  CL 87*  --  87* 92*  CO2 >45*  --  42* 37*  GLUCOSE 181*  --  123* 118*  BUN 22  --  17 18  CREATININE 0.88  --  0.89 1.12  CALCIUM 9.9  --  9.9 10.0  MG  --  2.2  --   --    Liver Function Tests:  Recent Labs Lab 09/02/13 1440  AST 19  ALT 21  ALKPHOS 66  BILITOT 0.4  PROT 6.4  ALBUMIN 3.4*   No results found for this basename: LIPASE, AMYLASE,  in the last 168 hours  Recent Labs Lab 09/02/13 1538  AMMONIA 53   CBC:  Recent Labs Lab 09/02/13 1440 09/03/13 0446  WBC 9.3 8.1  NEUTROABS 7.5  --   HGB 11.9* 12.3*  HCT 39.1 40.9  MCV 102.1* 102.8*  PLT 183 181   Cardiac Enzymes:  Recent Labs Lab 09/02/13 1652 09/02/13 2229 09/03/13 0446  TROPONINI <0.30 <0.30 <0.30   BNP (last 3 results) No results found for this basename: PROBNP,  in the last 8760 hours CBG: No results found for this basename: GLUCAP,  in the last 168 hours  Recent Results (from the past 240 hour(s))  MRSA PCR SCREENING     Status: None   Collection Time    09/03/13  1:17 PM      Result Value Ref Range Status   MRSA by PCR NEGATIVE  NEGATIVE  Final   Comment:            The GeneXpert MRSA Assay (FDA     approved for NASAL specimens     only), is one component of a     comprehensive MRSA colonization     surveillance program. It is not     intended to diagnose MRSA     infection nor to guide or     monitor treatment for     MRSA infections.     Studies: Ct Head Wo Contrast  09/02/2013   CLINICAL DATA:  Headache.  Vertigo.  Hypertension.  EXAM: CT HEAD WITHOUT CONTRAST  TECHNIQUE: Contiguous axial images were obtained from the base of the skull through the vertex without intravenous contrast.  COMPARISON:  05/18/2011 MR. 05/17/2011 CT.  FINDINGS: No intracranial hemorrhage.  Mild small vessel disease type changes. No CT evidence of large acute infarct. If posterior fossa infarct is of high clinical concern, MR imaging may be considered.  No intracranial mass lesion noted on this unenhanced exam.  No hydrocephalus.  Vascular calcifications.  Mild mucosal thickening inferior aspect right maxillary sinus and right ethmoid sinus air cells.  IMPRESSION: No intracranial hemorrhage or CT evidence of large acute infarct.  Please see above.   Electronically Signed   By: Chauncey Cruel M.D.   On: 09/02/2013 15:26   Dg Chest Port 1 View  09/02/2013   CLINICAL DATA:  Tremors and chest pain ; history of left pneumonectomy  EXAM: PORTABLE CHEST - 1 VIEW  COMPARISON:  DG CHEST 2 VIEW dated 05/13/2013  FINDINGS: There is opacification of most of the left hemithorax which is stable allowing for differences in exposure technique. The right lung is hyperinflated. The interstitial markings are more prominent throughout the right lung today than on the previous study. The left heart border is obscured. The patient has undergone previous median sternotomy and CABG. The pulmonary vascularity on the right is prominent.  IMPRESSION: The findings suggest pulmonary vascular congestion involving the remaining right lung. This may be of cardiac or noncardiac calls. Post  pneumonectomy changes on the left are stable.   Electronically Signed   By: David  Martinique   On: 09/02/2013 15:25    Scheduled Meds: . acetaZOLAMIDE  250 mg Oral BID  . antiseptic oral rinse  15 mL Mouth Rinse BID  . cholecalciferol  2,000 Units Oral BID  . clopidogrel  75 mg Oral Daily  . heparin  5,000 Units Subcutaneous 3 times per day  . ipratropium-albuterol  3 mL Nebulization Q6H WA  . nebivolol  10 mg Oral Daily  . sodium chloride  3 mL Intravenous Q12H   Continuous Infusions:    Principal Problem:  Chest pain Active Problems:   HTN (hypertension)   History of lung cancer   Hypertriglyceridemia   CAD (coronary artery disease)   Atrial myxoma   Occlusion and stenosis of carotid artery without mention of cerebral infarction   Movement disorder   Metabolic alkalosis   Acute-on-chronic respiratory failure   COPD (chronic obstructive pulmonary disease)   Hx of pneumonectomy    Time spent: >35 minutes     Kinnie Feil  [facility] Pager [phone]. If 7PM-7AM, please contact night-coverage at [email or web address], password [facility] 09/04/2013, 7:51 AM  LOS: 2 days

## 2013-09-04 NOTE — Progress Notes (Signed)
Pt tried to get up to void confuse bed alarm inplaced assisted standing up agitated at times yelling other nurses in the room helping pt back in bed-reoriented.

## 2013-09-04 NOTE — Progress Notes (Signed)
Pt refused to wear bipap for now-talk to pt wife into persuading pt to keep bipap till this am.

## 2013-09-04 NOTE — Progress Notes (Signed)
PULMONARY / CRITICAL CARE MEDICINE  Name: Calvin [name] MRN: [id number] DOB: Feb 11, 1933    ADMISSION DATE:  09/02/2013 CONSULTATION DATE:  09/02/2013  REFERRING MD :  Verneita Griffes  CHIEF COMPLAINT:  Abnormal blood gas  BRIEF PATIENT DESCRIPTION:  80 yo male with COPD, L pneumonectomy secondary to [place] and chronic respiratory failure on supplemental oxygen who presented with jerking limb movements, headaches and chest pain. He was found to have chronic hypercapnia on ABG.  PCCM asked to assess respiratory failure.  SIGNIFICANT EVENTS: 5/14 Admited, Neurology / Pulmonary consulted  STUDIES:  5/14 CT head >> no acute findings  LINES / TUBES:  CULTURES:  ANTIBIOTICS:  SUBJECTIVE:  Still agitated    VITAL SIGNS: Temp:  [97.4 F (36.3 C)-98.6 F (37 C)] 98.6 F (37 C) (05/16 0801) Pulse Rate:  [59-87] 70 (05/16 0801) Resp:  [11-20] 15 (05/16 0801) BP: (102-167)/(37-74) 115/37 mmHg (05/16 0725) SpO2:  [89 %-97 %] 91 % (05/16 0806) FiO2 (%):  [30 %] 30 % (05/16 0806)  PHYSICAL EXAMINATION: General: on bipap now Neuro: sleeping on bipap  normal strength, CN intact, ++ myoclonus noted HEENT:  Pupils reactive, no sinus tenderness, MP 3, no LAN Cardiovascular:  Regular, no murmur Lungs:  Scattered wheeze/rhonchi on Rt, absent breath sounds on Lt Abdomen:  Soft, non tender, normal bowel sounds Musculoskeletal:  No edema Skin:  No rashes  LABS: CBC  Recent Labs Lab 09/02/13 1440 09/03/13 0446  WBC 9.3 8.1  HGB 11.9* 12.3*  HCT 39.1 40.9  PLT 183 181   Coag's  Recent Labs Lab 09/03/13 0655  INR 1.01   BMET  Recent Labs Lab 09/02/13 1440 09/03/13 0446 09/04/13 0234  NA 137 134* 137  K 4.3 3.8 4.0  CL 87* 87* 92*  CO2 >45* 42* 37*  BUN 22 17 18   CREATININE 0.88 0.89 1.12  GLUCOSE 181* 123* 118*   Electrolytes  Recent Labs Lab 09/02/13 1440 09/02/13 1630 09/03/13 0446 09/04/13 0234  CALCIUM 9.9  --  9.9 10.0  MG  --  2.2  --   --    Sepsis  Markers No results found for this basename: LATICACIDVEN, PROCALCITON, O2SATVEN,  in the last 168 hours  ABG  Recent Labs Lab 09/02/13 1627 09/03/13 1035 09/04/13 0747  PHART 7.347* 7.327* 7.238*  PCO2ART 94.1* 79.5* 98.0*  PO2ART 66.0* 76.7* 62.0*   Liver Enzymes  Recent Labs Lab 09/02/13 1440  AST 19  ALT 21  ALKPHOS 66  BILITOT 0.4  ALBUMIN 3.4*   Cardiac Enzymes  Recent Labs Lab 09/02/13 1652 09/02/13 2229 09/03/13 0446  TROPONINI <0.30 <0.30 <0.30   Glucose No results found for this basename: GLUCAP,  in the last 168 hours  IMAGING:   Ct Head Wo Contrast  09/02/2013   CLINICAL DATA:  Headache.  Vertigo.  Hypertension.  EXAM: CT HEAD WITHOUT CONTRAST  TECHNIQUE: Contiguous axial images were obtained from the base of the skull through the vertex without intravenous contrast.  COMPARISON:  05/18/2011 MR. 05/17/2011 CT.  FINDINGS: No intracranial hemorrhage.  Mild small vessel disease type changes. No CT evidence of large acute infarct. If posterior fossa infarct is of high clinical concern, MR imaging may be considered.  No intracranial mass lesion noted on this unenhanced exam.  No hydrocephalus.  Vascular calcifications.  Mild mucosal thickening inferior aspect right maxillary sinus and right ethmoid sinus air cells.  IMPRESSION: No intracranial hemorrhage or CT evidence of large acute infarct.  Please see above.  Electronically Signed   By: Chauncey Cruel M.D.   On: 09/02/2013 15:26   Dg Chest Port 1 View  09/02/2013   CLINICAL DATA:  Tremors and chest pain ; history of left pneumonectomy  EXAM: PORTABLE CHEST - 1 VIEW  COMPARISON:  DG CHEST 2 VIEW dated 05/13/2013  FINDINGS: There is opacification of most of the left hemithorax which is stable allowing for differences in exposure technique. The right lung is hyperinflated. The interstitial markings are more prominent throughout the right lung today than on the previous study. The left heart border is obscured. The  patient has undergone previous median sternotomy and CABG. The pulmonary vascularity on the right is prominent.  IMPRESSION: The findings suggest pulmonary vascular congestion involving the remaining right lung. This may be of cardiac or noncardiac calls. Post pneumonectomy changes on the left are stable.   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 The patient can qualify for outpatient equipment   based on chronic failure / hypercarbia, however his    compliance can limit the benefit of this intervention.  Add steroids  More lasix   Long discussion with spouse and sister of the pt. They agree pt never wanted vent/intubation therefore has made the pt DNR>  I have personally obtained history, examined patient, evaluated and interpreted laboratory and imaging results, reviewed medical records, formulated assessment / plan and placed orders.  Elsie Stain, MD [facility]  [phone]  Cell  [phone]  If no response or cell goes to voicemail, call beeper [phone]  Pulmonary and [place] Pager: [phone]  09/04/2013, 9:29 AM

## 2013-09-04 NOTE — Progress Notes (Signed)
Pt refused bp check,notified Jonette Eva.

## 2013-09-05 MED ORDER — ALUM & MAG HYDROXIDE-SIMETH 200-200-20 MG/5ML PO SUSP
15.0000 mL | Freq: Four times a day (QID) | ORAL | Status: DC | PRN
  Administered 2013-09-05 (×2): 15 mL via ORAL
  Filled 2013-09-05 (×2): qty 30

## 2013-09-05 MED ORDER — POTASSIUM CHLORIDE CRYS ER 20 MEQ PO TBCR
30.0000 meq | EXTENDED_RELEASE_TABLET | Freq: Once | ORAL | Status: AC
  Administered 2013-09-05: 30 meq via ORAL
  Filled 2013-09-05 (×2): qty 1

## 2013-09-05 MED ORDER — PANTOPRAZOLE SODIUM 40 MG PO TBEC
40.0000 mg | DELAYED_RELEASE_TABLET | Freq: Every day | ORAL | Status: DC
  Administered 2013-09-05 – 2013-09-06 (×2): 40 mg via ORAL
  Filled 2013-09-05 (×2): qty 1

## 2013-09-05 MED ORDER — FUROSEMIDE 10 MG/ML IJ SOLN
40.0000 mg | Freq: Once | INTRAMUSCULAR | Status: AC
  Administered 2013-09-05: 40 mg via INTRAVENOUS
  Filled 2013-09-05: qty 4

## 2013-09-05 NOTE — Progress Notes (Addendum)
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 "-"Myoclonus thought likely due to chronic res failure, CO2 retention, hypoxia -neuro exam no focal; CT: No intracranial hemorrhage or CT evidence of large acute infarct  -no new symptoms; improving on BiPAP; per neurology no further evaluation   2. Acute on chronic combined res failure; s/p pneumonectomy (lung Ca); COPD on home oxygen -chronic CO2 retention; appreciate pulmonology evaluation; cont BiPAP; diuretic per pulmonology;  c/s CM, DME fo bipap;  -patient is DNR; d/w per pulmonology   3. CAD (coronary artery disease)-see above discussion. Continue Plavix continue other medications -chest pain on admission, resolved; per cardiology likely atypical;   -prognosis remains poor due to chronic respiratory failure, hypoxia, CO2 retention, s/p pneumonectomy   Code Status: full Family Communication:  D/w patient, his son at the bedside;   (indicate person spoken with, relationship, and if by phone, the number) Disposition Plan: home 24-48 hours, pend clinical improvement     Consultants:  Pulmonology  Cardiology]  Neurology     Procedures:  BiPAP  Antibiotics:  none (indicate start date, and stop date if known)  HPI/Subjective: alert  Objective: Filed Vitals:   09/05/13 0912  BP:   Pulse: 79  Temp:   Resp: 15    Intake/Output Summary (Last 24 hours) at 09/05/13 1130 Last data filed at 09/05/13 0354  Gross per 24 hour  Intake    720 ml  Output    700 ml  Net     20 ml   Filed Weights   09/02/13 1532 09/02/13 1738  Weight: 99.791 kg (220 lb) 98.431 kg (217 lb)    Exam:   General:  alert  Cardiovascular: s1,s2 rrr  Respiratory: no wheezing on R  Abdomen: soft, nt,nd   Musculoskeletal: no LE edema   Data Reviewed: Basic Metabolic Panel:  Recent Labs Lab 09/02/13 1440 09/02/13 1630 09/03/13 0446 09/04/13 0234  NA 137  --  134* 137  K 4.3  --  3.8 4.0  CL 87*  --  87* 92*  CO2 >45*  --  42* 37*  GLUCOSE 181*  --  123* 118*  BUN 22  --  17 18  CREATININE 0.88  --  0.89 1.12  CALCIUM 9.9  --  9.9 10.0  MG  --  2.2  --   --    Liver Function Tests:  Recent Labs Lab 09/02/13 1440  AST 19  ALT 21  ALKPHOS 66  BILITOT 0.4  PROT 6.4  ALBUMIN 3.4*   No results found for this basename: LIPASE, AMYLASE,  in the last 168 hours  Recent Labs Lab 09/02/13 1538  AMMONIA 53  CBC:  Recent Labs Lab 09/02/13 1440 09/03/13 0446  WBC 9.3 8.1  NEUTROABS 7.5  --   HGB 11.9* 12.3*  HCT 39.1 40.9  MCV 102.1* 102.8*  PLT 183 181   Cardiac Enzymes:  Recent Labs Lab 09/02/13 1652 09/02/13 2229 09/03/13 0446  TROPONINI <0.30 <0.30 <0.30   BNP (last 3 results) No results found for this basename: PROBNP,  in the last 8760 hours CBG: No results found for this basename: GLUCAP,  in the last 168 hours  Recent Results (from the past 240 hour(s))  MRSA PCR SCREENING     Status: None   Collection Time    09/03/13  1:17 PM      Result Value Ref Range Status   MRSA by PCR NEGATIVE  NEGATIVE Final    Comment:            The GeneXpert MRSA Assay (FDA     approved for NASAL specimens     only), is one component of a     comprehensive MRSA colonization     surveillance program. It is not     intended to diagnose MRSA     infection nor to guide or     monitor treatment for     MRSA infections.     Studies: No results found.  Scheduled Meds: . acetaZOLAMIDE  250 mg Oral BID  . antiseptic oral rinse  15 mL Mouth Rinse BID  . cholecalciferol  2,000 Units Oral BID  . clopidogrel  75 mg Oral Daily  . heparin  5,000 Units Subcutaneous 3 times per day  . ipratropium-albuterol  3 mL Nebulization Q4H  . methylPREDNISolone (SOLU-MEDROL) injection  40 mg Intravenous Q12H  . nebivolol  10 mg Oral Daily  . sodium chloride  3 mL Intravenous Q12H   Continuous Infusions:    Principal Problem:   Acute-on-chronic respiratory failure Active Problems:   HTN (hypertension)   History of lung cancer   Hypertriglyceridemia   CAD (coronary artery disease)   Atrial myxoma   Occlusion and stenosis of carotid artery without mention of cerebral infarction   Chest pain   Movement disorder   Metabolic alkalosis   COPD (chronic obstructive pulmonary disease)   Hx of pneumonectomy    Time spent: >35 minutes     Kinnie Feil  [facility] Pager [phone]. If 7PM-7AM, please contact night-coverage at [email or web address], password [facility] 09/05/2013, 11:30 AM  LOS: 3 days

## 2013-09-05 NOTE — Progress Notes (Signed)
Pt has been having some reflux today, pt's wife states he has it at home at times also and takes zantac and maalox prn. Notified Dr Daleen Bo, and orders received, pt given protonix and maalox at this time.

## 2013-09-05 NOTE — Progress Notes (Signed)
Day shift RT, informed of pt's order for BiPAP machine to be home style BiPAP machine, advised her to look at order to see specifically how Dr Joya Gaskins ordered it, pt to wear it at night every night, and prn during daytime per Dr Joya Gaskins.

## 2013-09-05 NOTE — Progress Notes (Signed)
Up in chair this afternoon and evening, tolerating well.  Reflux better, pt now eating dinner meal, pt encouraged to eat slow and chew well.

## 2013-09-05 NOTE — Progress Notes (Signed)
PULMONARY / CRITICAL CARE MEDICINE  Name: Calvin [name] MRN: [id number] DOB: [birth year]    ADMISSION DATE:  09/02/2013 CONSULTATION DATE:  09/02/2013  REFERRING MD :  Verneita Griffes  CHIEF COMPLAINT:  Abnormal blood gas  BRIEF PATIENT DESCRIPTION:  80 yo male with COPD, L pneumonectomy secondary to [place] and chronic respiratory failure on supplemental oxygen who presented with jerking limb movements, headaches and chest pain. He was found to have chronic hypercapnia on ABG.  PCCM asked to assess respiratory failure.  SIGNIFICANT EVENTS: 5/14 Admited, Neurology / Pulmonary consulted  STUDIES:  5/14 CT head >> no acute findings  LINES / TUBES:  CULTURES: none ANTIBIOTICS: none SUBJECTIVE:   Markedly better on steroids/increased BDs/diuresis   VITAL SIGNS: Temp:  [98.2 F (36.8 C)-98.9 F (37.2 C)] 98.2 F (36.8 C) (05/17 0803) Pulse Rate:  [71-83] 79 (05/17 0912) Resp:  [13-16] 15 (05/17 0912) BP: (96-138)/(43-79) 96/79 mmHg (05/17 0803) SpO2:  [91 %-100 %] 100 % (05/17 0912) FiO2 (%):  [30 %-40 %] 40 % (05/17 0532)  PHYSICAL EXAMINATION: General: off bipap, better  Neuro: more alert, no myoclonus.  normal strength, CN intact,  HEENT:  Pupils reactive, no sinus tenderness, MP 3, no LAN Cardiovascular:  Regular, no murmur Lungs:  Improved BS on R  Abdomen:  Soft, non tender, normal bowel sounds Musculoskeletal:  No edema Skin:  No rashes  LABS: CBC  Recent Labs Lab 09/02/13 1440 09/03/13 0446  WBC 9.3 8.1  HGB 11.9* 12.3*  HCT 39.1 40.9  PLT 183 181   Coag's  Recent Labs Lab 09/03/13 0655  INR 1.01   BMET  Recent Labs Lab 09/02/13 1440 09/03/13 0446 09/04/13 0234  NA 137 134* 137  K 4.3 3.8 4.0  CL 87* 87* 92*  CO2 >45* 42* 37*  BUN 22 17 18   CREATININE 0.88 0.89 1.12  GLUCOSE 181* 123* 118*   Electrolytes  Recent Labs Lab 09/02/13 1440 09/02/13 1630 09/03/13 0446 09/04/13 0234  CALCIUM 9.9  --  9.9 10.0  MG  --  2.2  --   --     Sepsis Markers No results found for this basename: LATICACIDVEN, PROCALCITON, O2SATVEN,  in the last 168 hours  ABG  Recent Labs Lab 09/02/13 1627 09/03/13 1035 09/04/13 0747  PHART 7.347* 7.327* 7.238*  PCO2ART 94.1* 79.5* 98.0*  PO2ART 66.0* 76.7* 62.0*   Liver Enzymes  Recent Labs Lab 09/02/13 1440  AST 19  ALT 21  ALKPHOS 66  BILITOT 0.4  ALBUMIN 3.4*   Cardiac Enzymes  Recent Labs Lab 09/02/13 1652 09/02/13 2229 09/03/13 0446  TROPONINI <0.30 <0.30 <0.30   Glucose No results found for this basename: GLUCAP,  in the last 168 hours  IMAGING:   No results found. 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  Cont aggressive medical care.  I have personally obtained history, examined patient, evaluated and interpreted laboratory and imaging results, reviewed medical records, formulated assessment / plan and placed orders.  Elsie Stain, [place]  Cell  [phone]  If no  response or cell goes to voicemail, call beeper [phone]  Pulmonary and [place]  09/05/2013, 9:20 AM

## 2013-09-05 NOTE — Progress Notes (Signed)
No Bipap tonight per RN,t,pt doing well off Bipap sats 98% on 2lpm easily aroused from sleep no grogginess.

## 2013-09-06 ENCOUNTER — Inpatient Hospital Stay (HOSPITAL_COMMUNITY): Payer: Medicare Other

## 2013-09-06 DIAGNOSIS — R0609 Other forms of dyspnea: Secondary | ICD-10-CM

## 2013-09-06 DIAGNOSIS — R0989 Other specified symptoms and signs involving the circulatory and respiratory systems: Secondary | ICD-10-CM

## 2013-09-06 LAB — BASIC METABOLIC PANEL
BUN: 36 mg/dL — AB (ref 6–23)
CO2: 35 mEq/L — ABNORMAL HIGH (ref 19–32)
Calcium: 9.8 mg/dL (ref 8.4–10.5)
Chloride: 92 mEq/L — ABNORMAL LOW (ref 96–112)
Creatinine, Ser: 1.19 mg/dL (ref 0.50–1.35)
GFR calc Af Amer: 66 mL/min — ABNORMAL LOW (ref >90)
GFR, EST NON AFRICAN AMERICAN: 57 mL/min — AB (ref >90)
Glucose, Bld: 168 mg/dL — ABNORMAL HIGH (ref 70–99)
Potassium: 4.2 mEq/L (ref 3.7–5.3)
SODIUM: 136 meq/L — AB (ref 137–147)

## 2013-09-06 LAB — CBC WITH DIFFERENTIAL/PLATELET
BASOS ABS: 0 10*3/uL (ref 0.0–0.1)
BASOS PCT: 0 % (ref 0–1)
Eosinophils Absolute: 0 10*3/uL (ref 0.0–0.7)
Eosinophils Relative: 0 % (ref 0–5)
HCT: 38.6 % — ABNORMAL LOW (ref 39.0–52.0)
Hemoglobin: 12.3 g/dL — ABNORMAL LOW (ref 13.0–17.0)
LYMPHS PCT: 4 % — AB (ref 12–46)
Lymphs Abs: 0.5 10*3/uL — ABNORMAL LOW (ref 0.7–4.0)
MCH: 31.5 pg (ref 26.0–34.0)
MCHC: 31.9 g/dL (ref 30.0–36.0)
MCV: 99 fL (ref 78.0–100.0)
MONOS PCT: 3 % (ref 3–12)
Monocytes Absolute: 0.4 10*3/uL (ref 0.1–1.0)
NEUTROS ABS: 9.7 10*3/uL — AB (ref 1.7–7.7)
NEUTROS PCT: 93 % — AB (ref 43–77)
Platelets: 203 10*3/uL (ref 150–400)
RBC: 3.9 MIL/uL — ABNORMAL LOW (ref 4.22–5.81)
RDW: 14.2 % (ref 11.5–15.5)
WBC: 10.6 10*3/uL — AB (ref 4.0–10.5)

## 2013-09-06 MED ORDER — PANTOPRAZOLE SODIUM 40 MG PO TBEC: 40.0000 mg | DELAYED_RELEASE_TABLET | Freq: Every day | ORAL | Status: DC

## 2013-09-06 MED ORDER — IPRATROPIUM-ALBUTEROL 0.5-2.5 (3) MG/3ML IN SOLN: 3.0000 mL | Freq: Four times a day (QID) | RESPIRATORY_TRACT | Status: DC | PRN

## 2013-09-06 MED ORDER — BISACODYL 10 MG RE SUPP
10.0000 mg | Freq: Once | RECTAL | Status: AC
  Administered 2013-09-06: 10 mg via RECTAL
  Filled 2013-09-06: qty 1

## 2013-09-06 MED ORDER — PREDNISONE 10 MG PO TABS: 10.0000 mg | ORAL_TABLET | Freq: Every day | ORAL | Status: DC

## 2013-09-06 MED ORDER — ACETAZOLAMIDE 250 MG PO TABS: 250.0000 mg | ORAL_TABLET | Freq: Two times a day (BID) | ORAL | Status: DC

## 2013-09-06 NOTE — Progress Notes (Signed)
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 "-"Myoclonus thought likely due to chronic res failure, CO2 retention, hypoxia -neuro exam no focal; CT: No intracranial hemorrhage or CT evidence of large acute infarct  -no new symptoms; improving on BiPAP; per neurology no further evaluation   2. Acute on chronic combined res failure; s/p pneumonectomy (lung Ca); COPD on home oxygen -chronic CO2 retention; appreciate pulmonology evaluation; cont BiPAP; diuretic per pulmonology;  c/s CM, DME fo bipap;  -patient is DNR; d/w per pulmonology;  -d/c plane per pulmonology   3. CAD (coronary artery disease)-see above discussion. Continue Plavix continue other medications -chest pain on admission, resolved; per cardiology likely atypical;   -prognosis remains poor due to chronic respiratory failure, hypoxia, CO2 retention, s/p pneumonectomy   -will arrange home BiPaPupon d/c   Code Status: full Family Communication:  D/w patient, his son at the bedside lesterday;   (indicate person spoken with, relationship, and if by phone, the number) Disposition Plan: home 24-48 hours,  pend clinical improvement    Consultants:  Pulmonology  Cardiology]  Neurology     Procedures:  BiPAP  Antibiotics:  none (indicate start date, and stop date if known)  HPI/Subjective: alert  Objective: Filed Vitals:   09/06/13 0756  BP: 153/66  Pulse: 79  Temp: 98.7 F (37.1 C)  Resp: 13    Intake/Output Summary (Last 24 hours) at 09/06/13 0800 Last data filed at 09/06/13 0530  Gross per 24 hour  Intake    660 ml  Output   2000 ml  Net  -1340 ml   Filed Weights   09/02/13 1532 09/02/13 1738  Weight: 99.791 kg (220 lb) 98.431 kg (217 lb)    Exam:   General:  alert  Cardiovascular: s1,s2 rrr  Respiratory: no wheezing on R  Abdomen: soft, nt,nd   Musculoskeletal: no LE edema   Data Reviewed: Basic Metabolic Panel:  Recent Labs Lab 09/02/13 1440 09/02/13 1630 09/03/13 0446 09/04/13 0234 09/06/13 0332  NA 137  --  134* 137 136*  K 4.3  --  3.8 4.0 4.2  CL 87*  --  87* 92* 92*  CO2 >45*  --  42* 37* 35*  GLUCOSE 181*  --  123* 118* 168*  BUN 22  --  17 18 36*  CREATININE 0.88  --  0.89 1.12 1.19  CALCIUM 9.9  --  9.9 10.0 9.8  MG  --  2.2  --   --   --    Liver Function Tests:  Recent Labs Lab 09/02/13 1440  AST 19  ALT 21  ALKPHOS 66  BILITOT 0.4  PROT 6.4  ALBUMIN 3.4*  No results found for this basename: LIPASE, AMYLASE,  in the last 168 hours  Recent Labs Lab 09/02/13 1538  AMMONIA 53   CBC:  Recent Labs Lab 09/02/13 1440 09/03/13 0446 09/06/13 0332  WBC 9.3 8.1 10.6*  NEUTROABS 7.5  --  9.7*  HGB 11.9* 12.3* 12.3*  HCT 39.1 40.9 38.6*  MCV 102.1* 102.8* 99.0  PLT 183 181 203   Cardiac Enzymes:  Recent Labs Lab 09/02/13 1652 09/02/13 2229 09/03/13 0446  TROPONINI <0.30 <0.30 <0.30   BNP (last 3 results) No results found for this basename: PROBNP,  in the last 8760 hours CBG: No results found for this basename: GLUCAP,  in the last 168 hours  Recent Results (from the past 240 hour(s))  MRSA  PCR SCREENING     Status: None   Collection Time    09/03/13  1:17 PM      Result Value Ref Range Status   MRSA by PCR NEGATIVE  NEGATIVE Final   Comment:            The GeneXpert MRSA Assay (FDA     approved for NASAL specimens     only), is one component of a     comprehensive MRSA colonization     surveillance program. It is not     intended to diagnose MRSA     infection nor to guide or     monitor treatment for     MRSA infections.     Studies: Dg Chest Port 1 View  09/06/2013   CLINICAL DATA:  Shortness of breath  EXAM: PORTABLE CHEST - 1 VIEW  COMPARISON:  09/02/2013  FINDINGS: Opacification of left hemithorax is again seen and stable consistent with a prior history. The right lung remains well aerated. Mild vascular congestion is again identified. No sizable infiltrate is seen.  IMPRESSION: Vascular congestion on the right.  Postoperative changes on the left.   Electronically Signed   By: Inez Catalina M.D.   On: 09/06/2013 07:54    Scheduled Meds: . acetaZOLAMIDE  250 mg Oral BID  . antiseptic oral rinse  15 mL Mouth Rinse BID  . bisacodyl  10 mg Rectal Once  . cholecalciferol  2,000 Units Oral BID  . clopidogrel  75 mg Oral Daily  . heparin  5,000 Units Subcutaneous 3 times per day  . ipratropium-albuterol  3 mL Nebulization Q4H  . methylPREDNISolone (SOLU-MEDROL) injection  40 mg Intravenous Q12H  . nebivolol  10 mg Oral Daily  . pantoprazole  40 mg Oral Daily  . sodium chloride  3 mL Intravenous Q12H   Continuous Infusions:    Principal Problem:   Acute-on-chronic respiratory failure Active Problems:   HTN (hypertension)   History of lung cancer   Hypertriglyceridemia   CAD (coronary artery disease)   Atrial myxoma   Occlusion and stenosis of carotid artery without mention of cerebral infarction   Chest pain   Movement disorder   Metabolic alkalosis   COPD (chronic obstructive pulmonary disease)   Hx of pneumonectomy    Time spent: >35 minutes      Calvin [name]  [facility] Pager [phone]. If 7PM-7AM, please contact night-coverage at [email or web address], password [facility] 09/06/2013, 8:00 AM  LOS: 4 days

## 2013-09-06 NOTE — Discharge Summary (Signed)
Physician Discharge Summary  Calvin [name] KNL:[id number] DOB: 1933-02-08 DOA: 09/02/2013  PCP:  [name], MD  Admit date: 09/02/2013 Discharge date: 09/06/2013  Time spent: >35 minutes  Recommendations for Outpatient Follow-up:  F/u with pulmonologist in 2 weeks F/u with PCP in 1 week as needed   Discharge Diagnoses:  Principal Problem:   Acute-on-chronic respiratory failure Active Problems:   HTN (hypertension)   History of lung cancer   Hypertriglyceridemia   CAD (coronary artery disease)   Atrial myxoma   Occlusion and stenosis of carotid artery without mention of cerebral infarction   Chest pain   Movement disorder   Metabolic alkalosis   COPD (chronic obstructive pulmonary disease)   Hx of pneumonectomy   Discharge Condition: stable   Diet recommendation: heart healthy   Filed Weights   09/02/13 1532 09/02/13 1738  Weight: 99.791 kg (220 lb) 98.431 kg (217 lb)    History of present illness:  80 y/o male with PMH of L pneumonectomy sq cell Ca 2000 chronic res failure/ prior 40 pack/yr smoker-now O2 dependent for the past 6-8 months with 2 L at rest, Diet-controlled diabetes mellitus (off metformin for the past 6-8 months with sugars ranging 106-120) , critical carotid artery disease R sdie s/p Endarterectomy 06/2005, 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 "-"Myoclonus thought likely due to chronic res failure, CO2 retention, hypoxia  -neuro exam no focal; CT: No intracranial hemorrhage or CT evidence of large acute infarct  -no new symptoms; resolved;  per neurology no  further evaluation  2. Acute on chronic combined res failure; s/p pneumonectomy (lung Ca); COPD on home oxygen  -chronic CO2 retention; significantly improved on acetazolamide+diuretics+steroids;  -cont current regimen; reevaluate in 1-2 week as outpatient;  -initially thought may benefit with home BiPaP; but on further pulmonology evaluation they d/c BiPAP; d/w confirmed Dr. Michaelene Song  3. CAD (coronary artery disease)-see above discussion. Continue Plavix continue other medications  -chest pain on admission, resolved; per cardiology likely atypical;    -prognosis remains poor due to chronic respiratory failure, hypoxia, CO2 retention, s/p pneumonectomy  D/w patient, his wife at the bedside   Procedures:  none (i.e. Studies not automatically included, echos, thoracentesis, etc; not x-rays)  Consultations:  Pulmonology, cardiology   Discharge Exam: Filed Vitals:   09/06/13 1232  BP: 132/77  Pulse: 79  Temp: 98.2 F (36.8 C)  Resp: 17    General: alert Cardiovascular: s1,s2 rrr Respiratory: improved AE  Discharge Instructions  Discharge Instructions   Diet - low sodium heart healthy    Complete by:  As directed      Discharge instructions    Complete by:  As directed   Please follow up with pulmonologist in 2 week s     Increase activity slowly    Complete by:  As directed             Medication List         acetaminophen 325 MG tablet  Commonly known as:  TYLENOL  Take 650 mg by mouth every 6 (six) hours as needed.  For pain     acetaZOLAMIDE 250 MG tablet  Commonly known as:  DIAMOX  Take 1 tablet (250 mg total) by mouth 2 (two) times daily.     albuterol 108 (90 BASE) MCG/ACT inhaler  Commonly known as:  PROVENTIL HFA;VENTOLIN HFA  Inhale 2 puffs into the lungs every 6 (six) hours as needed for wheezing.     BYSTOLIC 10 MG tablet  Generic drug:  nebivolol  TAKE 1 TABLET DAILY     cetirizine 10 MG tablet  Commonly known as:  ZYRTEC  Take 10 mg by  mouth daily as needed. For allergies     cholecalciferol 1000 UNITS tablet  Commonly known as:  VITAMIN D  Take 2,000 Units by mouth 2 (two) times daily.     clopidogrel 75 MG tablet  Commonly known as:  PLAVIX  Take 75 mg by mouth daily.     EPIPEN 2-PAK 0.3 mg/0.3 mL Soaj injection  Generic drug:  EPINEPHrine  Inject 0.3 mg into the skin See admin instructions. Take as needed for severe allergic reaction     felodipine 10 MG 24 hr tablet  Commonly known as:  PLENDIL  TAKE 1 TABLET ONCE A DAY     Fish Oil 1200 MG Caps  Take 1 capsule by mouth 2 (two) times daily.     ipratropium-albuterol 0.5-2.5 (3) MG/3ML Soln  Commonly known as:  DUONEB  Take 3 mLs by nebulization every 6 (six) hours as needed.     losartan-hydrochlorothiazide 100-25 MG per tablet  Commonly known as:  HYZAAR  TAKE 1 TABLET DAILY     magnesium oxide 400 MG tablet  Commonly known as:  MAG-OX  Take 400 mg by mouth daily.     multivitamins ther. w/minerals Tabs tablet  Take 1 tablet by mouth daily.     pantoprazole 40 MG tablet  Commonly known as:  PROTONIX  Take 1 tablet (40 mg total) by mouth daily.     predniSONE 10 MG tablet  Commonly known as:  DELTASONE  Take 1 tablet (10 mg total) by mouth daily with breakfast.       Allergies  Allergen Reactions  . Statins Other (See Comments)    Muscle aches  . Amoxicillin Rash       Follow-up Information   Follow up with  [name], MD. Schedule an appointment as soon as possible for a visit in 1 week.   Specialty:  Family Medicine   Contact information:   [address] [place] [address] [phone]       Follow up with [facility], MD. Schedule an appointment as soon as possible for a visit in 2 weeks.   Specialty:  Pulmonary Disease   Contact information:   [place] [address] [phone]        The results of significant diagnostics from this hospitalization (including imaging, microbiology,  ancillary and laboratory) are listed below for reference.    Significant Diagnostic Studies: Ct Head Wo Contrast  09/02/2013   CLINICAL DATA:  Headache.  Vertigo.  Hypertension.  EXAM: CT HEAD WITHOUT CONTRAST  TECHNIQUE: Contiguous axial images were obtained from the base of the skull through the vertex without intravenous contrast.  COMPARISON:  05/18/2011 MR. 05/17/2011 CT.  FINDINGS: No intracranial hemorrhage.  Mild small vessel disease type changes. No CT evidence of large acute infarct. If posterior fossa infarct is of high clinical concern, MR imaging may be considered.  No intracranial mass lesion  noted on this unenhanced exam.  No hydrocephalus.  Vascular calcifications.  Mild mucosal thickening inferior aspect right maxillary sinus and right ethmoid sinus air cells.  IMPRESSION: No intracranial hemorrhage or CT evidence of large acute infarct.  Please see above.   Electronically Signed   By: Chauncey Cruel M.D.   On: 09/02/2013 15:26   Dg Chest Port 1 View  09/06/2013   CLINICAL DATA:  Shortness of breath  EXAM: PORTABLE CHEST - 1 VIEW  COMPARISON:  09/02/2013  FINDINGS: Opacification of left hemithorax is again seen and stable consistent with a prior history. The right lung remains well aerated. Mild vascular congestion is again identified. No sizable infiltrate is seen.  IMPRESSION: Vascular congestion on the right.  Postoperative changes on the left.   Electronically Signed   By: Inez Catalina M.D.   On: 09/06/2013 07:54   Dg Chest Port 1 View  09/02/2013   CLINICAL DATA:  Tremors and chest pain ; history of left pneumonectomy  EXAM: PORTABLE CHEST - 1 VIEW  COMPARISON:  DG CHEST 2 VIEW dated 05/13/2013  FINDINGS: There is opacification of most of the left hemithorax which is stable allowing for differences in exposure technique. The right lung is hyperinflated. The interstitial markings are more prominent throughout the right lung today than on the previous study. The left heart border is  obscured. The patient has undergone previous median sternotomy and CABG. The pulmonary vascularity on the right is prominent.  IMPRESSION: The findings suggest pulmonary vascular congestion involving the remaining right lung. This may be of cardiac or noncardiac calls. Post pneumonectomy changes on the left are stable.   Electronically Signed   By: David  [place]   On: 09/02/2013 15:25    Microbiology: Recent Results (from the past 240 hour(s))  MRSA PCR SCREENING     Status: None   Collection Time    09/03/13  1:17 PM      Result Value Ref Range Status   MRSA by PCR NEGATIVE  NEGATIVE Final   Comment:            The GeneXpert MRSA Assay (FDA     approved for NASAL specimens     only), is one component of a     comprehensive MRSA colonization     surveillance program. It is not     intended to diagnose MRSA     infection nor to guide or     monitor treatment for     MRSA infections.     Labs: Basic Metabolic Panel:  Recent Labs Lab 09/02/13 1440 09/02/13 1630 09/03/13 0446 09/04/13 0234 09/06/13 0332  NA 137  --  134* 137 136*  K 4.3  --  3.8 4.0 4.2  CL 87*  --  87* 92* 92*  CO2 >45*  --  42* 37* 35*  GLUCOSE 181*  --  123* 118* 168*  BUN 22  --  17 18 36*  CREATININE 0.88  --  0.89 1.12 1.19  CALCIUM 9.9  --  9.9 10.0 9.8  MG  --  2.2  --   --   --    Liver Function Tests:  Recent Labs Lab 09/02/13 1440  AST 19  ALT 21  ALKPHOS 66  BILITOT 0.4  PROT 6.4  ALBUMIN 3.4*   No results found for this basename: LIPASE, AMYLASE,  in the last 168 hours  Recent Labs Lab 09/02/13 1538  AMMONIA 53   CBC:  Recent Labs Lab  09/02/13 1440 09/03/13 0446 09/06/13 0332  WBC 9.3 8.1 10.6*  NEUTROABS 7.5  --  9.7*  HGB 11.9* 12.3* 12.3*  HCT 39.1 40.9 38.6*  MCV 102.1* 102.8* 99.0  PLT 183 181 203   Cardiac Enzymes:  Recent Labs Lab 09/02/13 1652 09/02/13 2229 09/03/13 0446  TROPONINI <0.30 <0.30 <0.30   BNP: BNP (last 3 results) No results found for  this basename: PROBNP,  in the last 8760 hours CBG: No results found for this basename: GLUCAP,  in the last 168 hours     Signed:  Kinnie Feil  [facility] 09/06/2013, 2:57 PM

## 2013-09-06 NOTE — Progress Notes (Signed)
Reviewed discharge documentation with pt and his wife - verbalize understanding and state that they have no questions or concerns at this time.  IV removed and condom cath removed.  Assisted pt in getting dressed.  Nursing asst escorted pt and pt's wife to the car on home O2 of 2L  via wheelchair.  No acute issues upon discharge.

## 2013-09-06 NOTE — Progress Notes (Signed)
PULMONARY / CRITICAL CARE MEDICINE  Name: Calvin [name] MRN: [id number] DOB: November 16, 1932    ADMISSION DATE:  09/02/2013 CONSULTATION DATE:  09/02/2013  REFERRING MD :  Verneita Griffes  CHIEF COMPLAINT:  Abnormal blood gas  BRIEF PATIENT DESCRIPTION:  80 yo male with COPD, L pneumonectomy secondary to [place] and chronic respiratory failure on supplemental oxygen who presented with jerking limb movements, headaches and chest pain. He was found to have chronic hypercapnia on ABG.  PCCM asked to assess respiratory failure.  SIGNIFICANT EVENTS: 5/14 Admited, Neurology / Pulmonary consulted  STUDIES:  5/14 CT head >> no acute findings  LINES / TUBES:  CULTURES: None  ANTIBIOTICS: none  SUBJECTIVE:   Feels better.   VITAL SIGNS: Temp:  [98.1 F (36.7 C)-99.2 F (37.3 C)] 98.7 F (37.1 C) (05/18 0756) Pulse Rate:  [70-87] 83 (05/18 1106) Resp:  [11-18] 12 (05/18 1106) BP: (112-153)/(46-77) 132/77 mmHg (05/18 1106) SpO2:  [90 %-100 %] 99 % (05/18 1106)  PHYSICAL EXAMINATION: General: off bipap, better  Neuro: more alert, no myoclonus.  normal strength, CN intact,  HEENT:  Pupils reactive, no sinus tenderness, MP 3, no LAN Cardiovascular:  Regular, no murmur Lungs:  Improved BS on R  Abdomen:  Soft, non tender, normal bowel sounds Musculoskeletal:  No edema Skin:  No rashes  LABS: CBC  Recent Labs Lab 09/02/13 1440 09/03/13 0446 09/06/13 0332  WBC 9.3 8.1 10.6*  HGB 11.9* 12.3* 12.3*  HCT 39.1 40.9 38.6*  PLT 183 181 203   Coag's  Recent Labs Lab 09/03/13 0655  INR 1.01   BMET  Recent Labs Lab 09/03/13 0446 09/04/13 0234 09/06/13 0332  NA 134* 137 136*  K 3.8 4.0 4.2  CL 87* 92* 92*  CO2 42* 37* 35*  BUN 17 18 36*  CREATININE 0.89 1.12 1.19  GLUCOSE 123* 118* 168*   Electrolytes  Recent Labs Lab 09/02/13 1630 09/03/13 0446 09/04/13 0234 09/06/13 0332  CALCIUM  --  9.9 10.0 9.8  MG 2.2  --   --   --    Sepsis Markers No results found for  this basename: LATICACIDVEN, PROCALCITON, O2SATVEN,  in the last 168 hours  ABG  Recent Labs Lab 09/02/13 1627 09/03/13 1035 09/04/13 0747  PHART 7.347* 7.327* 7.238*  PCO2ART 94.1* 79.5* 98.0*  PO2ART 66.0* 76.7* 62.0*   Liver Enzymes  Recent Labs Lab 09/02/13 1440  AST 19  ALT 21  ALKPHOS 66  BILITOT 0.4  ALBUMIN 3.4*   Cardiac Enzymes  Recent Labs Lab 09/02/13 1652 09/02/13 2229 09/03/13 0446  TROPONINI <0.30 <0.30 <0.30   Glucose No results found for this basename: GLUCAP,  in the last 168 hours  IMAGING:   Dg Chest Port 1 View  09/06/2013   CLINICAL DATA:  Shortness of breath  EXAM: PORTABLE CHEST - 1 VIEW  COMPARISON:  09/02/2013  FINDINGS: Opacification of left hemithorax is again seen and stable consistent with a prior history. The right lung remains well aerated. Mild vascular congestion is again identified. No sizable infiltrate is seen.  IMPRESSION: Vascular congestion on the right.  Postoperative changes on the left.   Electronically Signed   By: Inez Catalina M.D.   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  Cont DuoNebs/Albuterol  increase to q4h  Limit sedative / hypnotic agents  D/C BiPAP  Cont steroids  More aggressive diureses.   PCCM will sign off, please call back if needed.  I have personally obtained history, examined patient, evaluated and interpreted laboratory and imaging results, reviewed medical records, formulated assessment / plan and placed orders.  Rush Farmer, M.D. [facility] Pulmonary/Critical Care Medicine. Pager: [phone]. After hours pager: [phone].

## 2013-09-07 NOTE — ED Provider Notes (Signed)
I saw and evaluated the patient, reviewed the resident's note and I agree with the findings and plan.   EKG Interpretation   Date/Time:  Thursday Sep 02 2013 14:24:16 EDT Ventricular Rate:  78 PR Interval:  176 QRS Duration: 75 QT Interval:  341 QTC Calculation: 388 R Axis:   52 Text Interpretation:  Incomplete analysis due to missing data in  precordial lead(s) Sinus rhythm ST elevation, consider inferior injury  Missing lead(s): V5 ED PHYSICIAN INTERPRETATION AVAILABLE IN [facility] Confirmed by TEST, Record ([id number]) on 09/04/2013 10:02:49 AM      Patient examined face-to-face. Patient has no focal neurological deficits laying supine, and has occasional random multiple extremity myoclonic jerks. When I stand him, he does have weakness of the right leg. I agree with  admission. Neurological consultation.  Tanna Furry, MD 09/07/13 [phone]

## 2013-09-15 ENCOUNTER — Encounter: Payer: Self-pay | Admitting: Internal Medicine

## 2013-09-15 ENCOUNTER — Ambulatory Visit (INDEPENDENT_AMBULATORY_CARE_PROVIDER_SITE_OTHER): Payer: Medicare Other | Admitting: Internal Medicine

## 2013-09-15 VITALS — BP 92/52 | HR 89 | Ht 70.0 in | Wt 216.4 lb

## 2013-09-15 DIAGNOSIS — J449 Chronic obstructive pulmonary disease, unspecified: Secondary | ICD-10-CM

## 2013-09-15 DIAGNOSIS — I6529 Occlusion and stenosis of unspecified carotid artery: Secondary | ICD-10-CM

## 2013-09-15 NOTE — Progress Notes (Signed)
Subjective:    Patient ID: Calvin [name], male    DOB: 04/22/1933, 80 y.o.   MRN: [id number]   PCP  [name], MD   HPI Admit date: 09/02/2013  Discharge date: 09/06/2013  Time spent: >35 minutes  Recommendations for Outpatient Follow-up:  F/u with pulmonologist in 2 weeks  F/u with PCP in 1 week as needed  Discharge Diagnoses:  Principal Problem:  Acute-on-chronic respiratory failure  Active Problems:  HTN (hypertension)  History of lung cancer  Hypertriglyceridemia  CAD (coronary artery disease)  Atrial myxoma  Occlusion and stenosis of carotid artery without mention of cerebral infarction  Chest pain  Movement disorder  Metabolic alkalosis  COPD (chronic obstructive pulmonary disease)  Hx of pneumonectomy  Discharge Condition: stable  Diet recommendation: heart healthy  Filed Weights    09/02/13 1532  09/02/13 1738   Weight:  99.791 kg (220 lb)  98.431 kg (217 lb)   History of present illness:  80 y/o male with PMH of L pneumonectomy sq cell Ca 2000 chronic res failure/ prior 40 pack/yr smoker-now O2 dependent for the past 6-8 months with 2 L at rest, Diet-controlled diabetes mellitus (off metformin for the past 6-8 months with sugars ranging 106-120) , critical carotid artery disease R sdie s/p Endarterectomy 06/2005, L atrial myxoma s/p excision 06/2005, P a flutter, CABG x 1 06/2005, Admission 05/17/11 L sided weakness + numbness=TIA, Restless leg syndrome for the past year and has been on chronic magnesium for the past came to the hospital is that over the past 2-3 days patient has been having sudden episodes of jerking movements of upper and lower extremities without unilateral weakness found to have in acute on chronic resp failure  Hospital Course:  1. 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 Acute on chronic combined res failure; s/p pneumonectomy (lung Ca); COPD on home oxygen  -chronic CO2 retention; significantly improved on acetazolamide+diuretics+steroids;  -cont current regimen; reevaluate in 1-2 week as outpatient;  -initially thought may benefit with home BiPaP; but on further pulmonology evaluation they d/c BiPAP; d/w confirmed Dr. Michaelene Song  3. CAD (coronary artery disease)-see above discussion. Continue Plavix continue other medications  -chest pain on admission, resolved; per cardiology likely atypical;  -prognosis remains poor due to chronic respiratory failure, hypoxia, CO2 retention, s/p pneumonectomy  D/w patient, his wife at the bedside  Procedures:   none (i.e. Studies not automatically included, echos, thoracentesis, etc; not x-rays) ABG 09/04/13: 7.23/98/62 TRoponin x 3 negative    PORTABLE CHEST - 1 VIEW  COMPARISON: 09/02/2013  FINDINGS:  Opacification of left hemithorax is again seen and stable consistent  with a prior history. The right lung remains well aerated. Mild  vascular congestion is again identified. No sizable infiltrate is  seen.  IMPRESSION:  Vascular congestion on the right.  Postoperative changes on the left.  Electronically Signed  By: Inez Catalina M.D.  On: 09/06/2013 07:54   Consultations:  Pulmonology, cardiology  Discharge Exam:  Filed Vitals:    09/06/13 1232   BP:  132/77   Pulse:  79   Temp:  98.2 F (36.8 C)   Resp:  17   General: alert  Cardiovascular: s1,s2 rrr  Respiratory: improved AE  Discharge Instructions  Discharge Instructions    Diet - low sodium heart healthy  Complete by: As directed       Discharge instructions  Complete by: As directed    Please follow up with pulmonologist in 2 week s     Increase activity slowly  Complete by: As directed              Medication List         acetaminophen 325 MG tablet    Commonly known as: TYLENOL    Take 650 mg by mouth every 6  (six) hours as needed. For pain    acetaZOLAMIDE 250 MG tablet    Commonly known as: DIAMOX    Take 1 tablet (250 mg total) by mouth 2 (two) times daily.    albuterol 108 (90 BASE) MCG/ACT inhaler    Commonly known as: PROVENTIL HFA;VENTOLIN HFA    Inhale 2 puffs into the lungs every 6 (six) hours as needed for wheezing.    BYSTOLIC 10 MG tablet    Generic drug: nebivolol    TAKE 1 TABLET DAILY    cetirizine 10 MG tablet    Commonly known as: ZYRTEC    Take 10 mg by mouth daily as needed. For allergies    cholecalciferol 1000 UNITS tablet    Commonly known as: VITAMIN D    Take 2,000 Units by mouth 2 (two) times daily.    clopidogrel 75 MG tablet    Commonly known as: PLAVIX    Take 75 mg by mouth daily.    EPIPEN 2-PAK 0.3 mg/0.3 mL Soaj injection    Generic drug: EPINEPHrine    Inject 0.3 mg into the skin See admin instructions. Take as needed for severe allergic reaction    felodipine 10 MG 24 hr tablet    Commonly known as: PLENDIL    TAKE 1 TABLET ONCE A DAY    Fish Oil 1200 MG Caps    Take 1 capsule by mouth 2 (two) times daily.    ipratropium-albuterol 0.5-2.5 (3) MG/3ML Soln    Commonly known as: DUONEB    Take 3 mLs by nebulization every 6 (six) hours as needed.    losartan-hydrochlorothiazide 100-25 MG per tablet    Commonly known as: HYZAAR    TAKE 1 TABLET DAILY    magnesium oxide 400 MG tablet    Commonly known as: MAG-OX    Take 400 mg by mouth daily.    multivitamins ther. w/minerals Tabs tablet    Take 1 tablet by mouth daily.    pantoprazole 40 MG tablet    Commonly known as: PROTONIX    Take 1 tablet (40 mg total) by mouth daily.    predniSONE 10 MG tablet    Commonly known as: DELTASONE    Take 1 tablet (10 mg total) by mouth daily with breakfast.      Allergies   Allergen  Reactions   .  Statins  Other (See Comments)     Muscle aches   .  Amoxicillin  Rash        Follow-up Information    Follow up with [name], MD. Schedule an appointment  as soon as possible for a visit in 1 week.    Specialty: Family Medicine    Contact information:    [address]  [place] [address]  [phone]       Follow up with [facility], MD. Schedule an appointment as soon as possible for a visit in 2 weeks.    Specialty: Pulmonary Disease  Contact information:    [place] [address]  [phone]        OV 09/15/2013  Chief Complaint  Patient presents with  . Follow-up    HFU- Pt was d/c from [facility]. Pt states breathing has imrpoved since D/C. Denies CP. C/o morning dry cough. C/o mild SOB with exertion.    Presents with wife. Since hospitalization is doing betteer. Myoclonic jerks are resolved. He has never had primary pulmonary MD till now. They are loathe to return for close followup. Wife wishes to know etiology and early recognition of same. He is continuing nebs. I am not sure he and his wife know their meds well but they declined med calendar visit with NP. Advised 4 week followup from today but they declined that too.    Review of Systems  Constitutional: Negative for fever and unexpected weight change.  HENT: Negative for congestion, dental problem, ear pain, nosebleeds, postnasal drip, rhinorrhea, sinus pressure, sneezing, sore throat and trouble swallowing.   Eyes: Negative for redness and itching.  Respiratory: Positive for cough and shortness of breath. Negative for chest tightness and wheezing.   Cardiovascular: Negative for palpitations and leg swelling.  Gastrointestinal: Negative for nausea and vomiting.  Genitourinary: Negative for dysuria.  Musculoskeletal: Negative for joint swelling.  Skin: Negative for rash.  Neurological: Negative for headaches.  Hematological: Does not bruise/bleed easily.  Psychiatric/Behavioral: Negative for dysphoric mood. The patient is not nervous/anxious.        Objective:   Physical Exam  Nursing note and vitals reviewed. Constitutional: He is oriented  to person, place, and time. He appears well-developed and well-nourished. No distress.  Body mass index is 31.05 kg/(m^2).   HENT:  Head: Normocephalic and atraumatic.  Right Ear: External ear normal.  Left Ear: External ear normal.  Mouth/Throat: Oropharynx is clear and moist. No oropharyngeal exudate.  o2 on   Eyes: Conjunctivae and EOM are normal. Pupils are equal, round, and reactive to light. Right eye exhibits no discharge. Left eye exhibits no discharge. No scleral icterus.  Neck: Normal range of motion. Neck supple. No JVD present. No tracheal deviation present. No thyromegaly present.  Cardiovascular: Normal rate, regular rhythm and intact distal pulses.  Exam reveals no gallop and no friction rub.   No murmur heard. Pulmonary/Chest: Effort normal and breath sounds normal. No respiratory distress. He has no wheezes. He has no rales. He exhibits no tenderness.  Left shoulder droop No air entry on left side = s/p pneumonectomy Rt side overall diminished air entry  Abdominal: Soft. Bowel sounds are normal. He exhibits no distension and no mass. There is no tenderness. There is no rebound and no guarding.  Musculoskeletal: Normal range of motion. He exhibits no edema and no tenderness.  Walks with cane  Lymphadenopathy:    He has no cervical adenopathy.  Neurological: He is alert and oriented to person, place, and time. He has normal reflexes. No cranial nerve deficit. Coordination normal.  Skin: Skin is warm and dry. No rash noted. He is not diaphoretic. No erythema. No pallor.  Psychiatric: He has a normal mood and affect. His behavior is normal. Judgment and thought content normal.     Filed Vitals:   09/15/13 1121  BP: 92/52  Pulse: 89  Height: 5\' 10"  (1.778 m)  Weight: 216 lb 6.4 oz (98.158 kg)  SpO2: 95%   On o2     Assessment & Plan:  You have severe copd and chronic respiratory  failure Better now after hospitalization Continue o2 and nebs as before Watch out  for signs of worsening; call us [phone] or go to ER when that happens  Followup  spirometry at [facility] hospital in 2 months  abg at [place] in 2 months (based on this will evaluate for nocturnal bipap)  return to see me or my NP in 2 months or sooner if needed

## 2013-09-15 NOTE — Patient Instructions (Signed)
You have severe copd and chronic respiratory failure Better now after hospitalization Continue o2 and nebs as before Watch out for signs of worsening; call us [phone] or go to ER when that happens  Followup  spirometry at [facility] hospital in 2 months  abg at [place] in 2 months  return to see me or my NP in 2 months or sooner if needed

## 2013-09-17 NOTE — Assessment & Plan Note (Signed)
You have severe copd and chronic respiratory failure with hypercapnia Better now after hospitalization Continue o2 and nebs as before Watch out for signs of worsening; call us [phone] or go to ER when that happens  Followup I educated you on disease and course and monitoring for AECOPD I educated you on calling us or going to ER at sign of AECOPD  spirometry at [facility] hospital in 2 months  abg at [place] in 2 months  return to see me or my NP in 2 months or sooner if needed Note to self: evaluate for nocturnal bipap depending on abg results

## 2013-09-29 ENCOUNTER — Ambulatory Visit: Payer: Medicare Other | Admitting: Internal Medicine

## 2013-11-03 ENCOUNTER — Ambulatory Visit (HOSPITAL_COMMUNITY)
Admission: RE | Admit: 2013-11-03 | Discharge: 2013-11-03 | Disposition: A | Discharge status: Home / Self Care | Payer: Medicare Other | Source: Ambulatory Visit | Attending: Internal Medicine | Admitting: Internal Medicine

## 2013-11-03 DIAGNOSIS — J988 Other specified respiratory disorders: Secondary | ICD-10-CM | POA: Insufficient documentation

## 2013-11-03 DIAGNOSIS — Z87891 Personal history of nicotine dependence: Secondary | ICD-10-CM | POA: Diagnosis not present

## 2013-11-03 DIAGNOSIS — R0609 Other forms of dyspnea: Secondary | ICD-10-CM | POA: Diagnosis not present

## 2013-11-03 DIAGNOSIS — J449 Chronic obstructive pulmonary disease, unspecified: Secondary | ICD-10-CM | POA: Diagnosis not present

## 2013-11-03 DIAGNOSIS — R062 Wheezing: Secondary | ICD-10-CM | POA: Diagnosis not present

## 2013-11-03 DIAGNOSIS — J4489 Other specified chronic obstructive pulmonary disease: Secondary | ICD-10-CM | POA: Insufficient documentation

## 2013-11-03 DIAGNOSIS — R0989 Other specified symptoms and signs involving the circulatory and respiratory systems: Secondary | ICD-10-CM | POA: Diagnosis not present

## 2013-11-04 LAB — BLOOD GAS, ARTERIAL
Acid-Base Excess: 6.5 mmol/L — ABNORMAL HIGH (ref 0.0–2.0)
Bicarbonate: 32.4 mEq/L — ABNORMAL HIGH (ref 20.0–24.0)
DRAWN BY: 244901
O2 CONTENT: 2 L/min
O2 Saturation: 96 %
PO2 ART: 106 mmHg — AB (ref 80.0–100.0)
Patient temperature: 98.6
TCO2: 28.8 mmol/L (ref 0–100)
pCO2 arterial: 54.2 mmHg — ABNORMAL HIGH (ref 35.0–45.0)
pH, Arterial: 7.394 (ref 7.350–7.450)

## 2013-11-07 LAB — SPIROMETRY WITH GRAPH
FEF 25-75 Pre: 0.34 L/sec
FEF2575-%Pred-Pre: 16 %
FEV1-%Pred-Pre: 26 %
FEV1-PRE: 0.76 L
FEV1FVC-%Pred-Pre: 74 %
FEV6-%PRED-PRE: 35 %
FEV6-Pre: 1.37 L
FEV6FVC-%Pred-Pre: 104 %
FVC-%PRED-PRE: 34 %
FVC-Pre: 1.41 L
PRE FEV6/FVC RATIO: 97 %
Pre FEV1/FVC ratio: 54 %

## 2013-11-08 ENCOUNTER — Encounter: Payer: Self-pay | Admitting: Family

## 2013-11-09 ENCOUNTER — Ambulatory Visit (INDEPENDENT_AMBULATORY_CARE_PROVIDER_SITE_OTHER): Payer: Medicare Other | Admitting: Family

## 2013-11-09 ENCOUNTER — Ambulatory Visit: Payer: Medicare Other | Admitting: Vascular Surgery

## 2013-11-09 ENCOUNTER — Other Ambulatory Visit (HOSPITAL_COMMUNITY): Payer: Medicare Other

## 2013-11-09 ENCOUNTER — Ambulatory Visit (HOSPITAL_COMMUNITY)
Admission: RE | Admit: 2013-11-09 | Discharge: 2013-11-09 | Disposition: A | Discharge status: Home / Self Care | Payer: Medicare Other | Source: Ambulatory Visit | Attending: Family | Admitting: Family

## 2013-11-09 ENCOUNTER — Encounter: Payer: Self-pay | Admitting: Family

## 2013-11-09 VITALS — BP 117/68 | HR 71 | Resp 16 | Ht 70.5 in | Wt 219.0 lb

## 2013-11-09 DIAGNOSIS — I6529 Occlusion and stenosis of unspecified carotid artery: Secondary | ICD-10-CM

## 2013-11-09 DIAGNOSIS — Z48812 Encounter for surgical aftercare following surgery on the circulatory system: Secondary | ICD-10-CM | POA: Insufficient documentation

## 2013-11-09 NOTE — Progress Notes (Signed)
Established Carotid Patient   History of Present Illness  Calvin [name] is a 80 y.o. male patient of Dr. Kellie Simmering who is status post a right CEA in March 2007. The patient reports having had a TIA  resulting in some left-sided paresthesias lasting less than 24 hours. The patient was hospitalized in January 2013 for this for 2 days. The patient and his wife state they are following up with [place] [facility] regarding any symptoms or neural deficit from that TIA. The patient states he is recovered "fully", and has had no further TIA activity. He returns today for follow up.  Wife reports he was hospitalized in May, 2015, at [facility], for elevated CO2.  Started Plavix in 2013. He has a history of headaches which have returned, states he was evaluated for this. He and his wife state pt has no known history of enlarged thyroid which was found on carotid Duplex today; he states that he has occasional trouble swallowing pills, feels hot then cold.   Pt Diabetic: "borderline" Pt smoker: former smoker, quit in 1999  Pt meds include: Statin : No, statins cause myalgias and arthralgias ASA: Yes Other anticoagulants/antiplatelets: Plavix   Past Medical History  Diagnosis Date  . HTN (hypertension)   . History of lung cancer   . Hypertriglyceridemia   . CAD (coronary artery disease)   . Atrial myxoma     Left  . Carotid artery disease   . Hearing loss   . Diabetes mellitus   . Arthritis   . COPD (chronic obstructive pulmonary disease)   . Cancer     Social History History  Substance Use Topics  . Smoking status: Former Smoker    Types: Cigarettes    Quit date: 07/21/1997  . Smokeless tobacco: Former Systems developer  . Alcohol Use: No    Family History Family History  Problem Relation Age of Onset  . Heart disease Mother     NOT before age 65  . Heart attack Mother   . Heart disease Sister   . Cancer Sister     Breast  . Stomach cancer Father   . Cancer Father     Stomach    Surgical  History Past Surgical History  Procedure Laterality Date  . Cabg x 1 with reverse saphenous vein graft to distal rt coronary artery and excision of left atrial myxoma  07/12/2005    Dr Servando Snare  . Left pneumonectomy  05/03/1998    Dr Servando Snare  . Combined mediastinoscopy and bronchoscopy  05/01/1998    Dr Servando Snare  . Rt  carotid endarterectomy      Dr Kellie Simmering  . Coronary artery bypass graft    . Carotid endarterectomy  2007    Right CEA  . Lung removal, partial  2000    Left lung removed     Allergies  Allergen Reactions  . Statins Other (See Comments)    Muscle aches  . Amoxicillin Rash    Current Outpatient Prescriptions  Medication Sig Dispense Refill  . acetaminophen (TYLENOL) 325 MG tablet Take 650 mg by mouth every 6 (six) hours as needed. For pain      . albuterol (PROVENTIL HFA;VENTOLIN HFA) 108 (90 BASE) MCG/ACT inhaler Inhale 2 puffs into the lungs every 6 (six) hours as needed for wheezing.      Marland Kitchen BYSTOLIC 10 MG tablet TAKE 1 TABLET DAILY  90 tablet  0  . cetirizine (ZYRTEC) 10 MG tablet Take 10 mg by mouth daily as needed. For  allergies      . cholecalciferol (VITAMIN D) 1000 UNITS tablet Take 2,000 Units by mouth 2 (two) times daily.       . clopidogrel (PLAVIX) 75 MG tablet Take 75 mg by mouth daily.      Marland Kitchen EPIPEN 2-PAK 0.3 MG/0.3ML DEVI Inject 0.3 mg into the skin See admin instructions. Take as needed for severe allergic reaction      . felodipine (PLENDIL) 10 MG 24 hr tablet TAKE 1 TABLET ONCE A DAY  90 tablet  2  . ipratropium-albuterol (DUONEB) 0.5-2.5 (3) MG/3ML SOLN Take 3 mLs by nebulization every 6 (six) hours as needed.  360 mL  1  . losartan-hydrochlorothiazide (HYZAAR) 100-25 MG per tablet TAKE 1 TABLET DAILY  90 tablet  2  . magnesium oxide (MAG-OX) 400 MG tablet Take 400 mg by mouth daily.      . Multiple Vitamins-Minerals (MULTIVITAMINS THER. W/MINERALS) TABS Take 1 tablet by mouth daily.      . Omega-3 Fatty Acids (FISH OIL) 1200 MG CAPS Take 1 capsule  by mouth 2 (two) times daily.      Marland Kitchen acetaZOLAMIDE (DIAMOX) 250 MG tablet Take 1 tablet (250 mg total) by mouth 2 (two) times daily.  30 tablet  0  . pantoprazole (PROTONIX) 40 MG tablet Take 1 tablet (40 mg total) by mouth daily.  21 tablet  0  . predniSONE (DELTASONE) 10 MG tablet Take 10 mg by mouth daily with breakfast. 3 for 3 days, 2 for 3 days, 1 for 3 days and stop       No current facility-administered medications for this visit.    Review of Systems : See HPI for pertinent positives and negatives.  Physical Examination   Filed Vitals:   11/09/13 1507 11/09/13 1510  BP: 132/67 117/68  Pulse: 73 71  Resp:  16  Height:  5' 10.5" (1.791 m)  Weight:  219 lb (99.338 kg)  SpO2:  94%   Body mass index is 30.97 kg/(m^2).  General: WDWN obese male in NAD GAIT: normal Eyes: PERRLA Pulmonary:  Non-labored, CTAB, little or no air movement left posterior fields, Negative  Rales, Negative rhonchi, & Negative wheezing. He is using his portable supplemental O2/[place]. from home  Cardiac: regular Rhythm ,  Negative detected murmur.  VASCULAR EXAM Carotid Bruits Left Right   Negative Negative   Radial pulses are 2+ palpable and equal.                                                                                                                       Gastrointestinal: soft, nontender, BS WNL, no r/g,  negative masses.  Musculoskeletal: Negative muscle atrophy/wasting. M/S 5/5 throughout, Extremities without ischemic changes.  Neurologic: A&O X 3; Appropriate Affect ; SENSATION ;normal;  Speech is loud CN 2-12 intact except is hard of hearing, Pain and light touch intact in extremities, Motor exam as listed above.   Non-Invasive Vascular Imaging CAROTID DUPLEX 11/09/2013   CEREBROVASCULAR DUPLEX EVALUATION  INDICATION: Carotid artery disease    PREVIOUS INTERVENTION(S): Right carotid endarterectomy 07/12/2005    DUPLEX EXAM: Carotid duplex    RIGHT  LEFT  Peak Systolic  Velocities (cm/s) End Diastolic Velocities (cm/s) Plaque LOCATION Peak Systolic Velocities (cm/s) End Diastolic Velocities (cm/s) Plaque  126 15 - CCA PROXIMAL 139 27 HT  107 20 HM CCA MID 114 29 HT  280 40 - CCA DISTAL 93 21 HT  130 19 - ECA 333 25 CP  49 11 HT ICA PROXIMAL 217 59 CP  43 11 - ICA MID 96 10 -  49 15 - ICA DISTAL 101 17 -    N/A ICA / CCA Ratio (PSV) N/A  Antegrade Vertebral Flow Antegrade  175 Brachial Systolic Pressure (mmHg) 102  Triphasic Brachial Artery Waveforms Triphasic    Plaque Morphology:  HM = Homogeneous, HT = Heterogeneous, CP = Calcific Plaque, SP = Smooth Plaque, IP = Irregular Plaque     ADDITIONAL FINDINGS: Enlarged right thyroid 3.1 x 2.3    IMPRESSION: 1. Patent right carotid endarterectomy site with stenosis at the proximal segment of the carotid endarterectomy site (ratio 2.6).. 2. 40 - 59% left internal carotid artery stenosis. 3. Left external carotid artery stenosis.    Compared to the previous exam:  No significant change.    Assessment: Calvin [name] is a 80 y.o. male who is status post right CEA in March 2007.  He had a TIA in 2013 with no further TIA activity since then. Carotid Duplex today demonstrates a patent right carotid endarterectomy site with stenosis at the proximal segment of the carotid endarterectomy site and  Mild/moderate left internal carotid artery stenosis. The  ICA stenosis is  Unchanged from previous exam. An additional finding is an enlarged right lobe of thyroid: 3.1 x 2.3 cm; patient was advised to follow up with his PCP re this. He has occasional trouble swallowing pills and alternates feeling too hot and too cold.  Plan: Follow-up in 1 year with Carotid Duplex scan.   I discussed in depth with the patient the nature of atherosclerosis, and emphasized the importance of maximal medical management including strict control of blood pressure, blood glucose, and lipid levels, obtaining regular exercise, and continued  cessation of smoking.  The patient is aware that without maximal medical management the underlying atherosclerotic disease process will progress, limiting the benefit of any interventions. The patient was given information about stroke prevention and what symptoms should prompt the patient to seek immediate medical care. Thank you for allowing [place] to participate in this patient's care.  Clemon Chambers, RN, MSN, FNP-C [facility] Office: [phone]  Clinic Physician: Oneida Alar  11/09/2013 3:20 PM

## 2013-11-09 NOTE — Patient Instructions (Signed)
Stroke Prevention Some medical conditions and behaviors are associated with an increased chance of having a stroke. You may prevent a stroke by making healthy choices and managing medical conditions. HOW CAN I REDUCE MY RISK OF HAVING A STROKE?   Stay physically active. Get at least 30 minutes of activity on most or all days.  Do not smoke. It may also be helpful to avoid exposure to secondhand smoke.  Limit alcohol use. Moderate alcohol use is considered to be:  No more than 2 drinks per day for men.  No more than 1 drink per day for nonpregnant women.  Eat healthy foods. This involves  Eating 5 or more servings of fruits and vegetables a day.  Following a diet that addresses high blood pressure (hypertension), high cholesterol, diabetes, or obesity.  Manage your cholesterol levels.  A diet low in saturated fat, trans fat, and cholesterol and high in fiber may control cholesterol levels.  Take any prescribed medicines to control cholesterol as directed by your health care provider.  Manage your diabetes.  A controlled-carbohydrate, controlled-sugar diet is recommended to manage diabetes.  Take any prescribed medicines to control diabetes as directed by your health care provider.  Control your hypertension.  A low-salt (sodium), low-saturated fat, low-trans fat, and low-cholesterol diet is recommended to manage hypertension.  Take any prescribed medicines to control hypertension as directed by your health care provider.  Maintain a healthy weight.  A reduced-calorie, low-sodium, low-saturated fat, low-trans fat, low-cholesterol diet is recommended to manage weight.  Stop drug abuse.  Avoid taking birth control pills.  Talk to your health care provider about the risks of taking birth control pills if you are over 35 years old, smoke, get migraines, or have ever had a blood clot.  Get evaluated for sleep disorders (sleep apnea).  Talk to your health care provider about  getting a sleep evaluation if you snore a lot or have excessive sleepiness.  Take medicines as directed by your health care provider.  For some people, aspirin or blood thinners (anticoagulants) are helpful in reducing the risk of forming abnormal blood clots that can lead to stroke. If you have the irregular heart rhythm of atrial fibrillation, you should be on a blood thinner unless there is a good reason you cannot take them.  Understand all your medicine instructions.  Make sure that other other conditions (such as anemia or atherosclerosis) are addressed. SEEK IMMEDIATE MEDICAL CARE IF:   You have sudden weakness or numbness of the face, arm, or leg, especially on one side of the body.  Your face or eyelid droops to one side.  You have sudden confusion.  You have trouble speaking (aphasia) or understanding.  You have sudden trouble seeing in one or both eyes.  You have sudden trouble walking.  You have dizziness.  You have a loss of balance or coordination.  You have a sudden, severe headache with no known cause.  You have new chest pain or an irregular heartbeat. Any of these symptoms may represent a serious problem that is an emergency. Do not wait to see if the symptoms will go away. Get medical help at once. Call your local emergency services  (911 in [place] Do not drive yourself to the hospital. Document Released: 05/16/2004 Document Revised: 01/27/2013 Document Reviewed: 10/09/2012 [employer] Patient Information 2015 [employer]. This information is not intended to replace advice given to you by your health care provider. Make sure you discuss any questions you have with your health   care provider.  

## 2013-11-11 ENCOUNTER — Other Ambulatory Visit: Payer: Self-pay | Admitting: Interventional Cardiology

## 2013-11-12 ENCOUNTER — Other Ambulatory Visit: Payer: Self-pay | Admitting: Interventional Cardiology

## 2013-11-15 ENCOUNTER — Encounter: Payer: Self-pay | Admitting: Adult Health

## 2013-11-15 ENCOUNTER — Ambulatory Visit (INDEPENDENT_AMBULATORY_CARE_PROVIDER_SITE_OTHER): Payer: Medicare Other | Admitting: Adult Health

## 2013-11-15 VITALS — BP 108/56 | HR 65 | Temp 97.7°F | Ht 70.0 in | Wt 219.6 lb

## 2013-11-15 DIAGNOSIS — I6529 Occlusion and stenosis of unspecified carotid artery: Secondary | ICD-10-CM

## 2013-11-15 DIAGNOSIS — J449 Chronic obstructive pulmonary disease, unspecified: Secondary | ICD-10-CM

## 2013-11-15 NOTE — Progress Notes (Signed)
Subjective:    Patient ID: Calvin [name], male    DOB: [birth year], 80 y.o.   MRN: [id number]   PCP  [name], MD   HPI Admit date: 09/02/2013  Discharge date: 09/06/2013  Time spent: >35 minutes  Recommendations for Outpatient Follow-up:  F/u with pulmonologist in 2 weeks  F/u with PCP in 1 week as needed  Discharge Diagnoses:  Principal Problem:  Acute-on-chronic respiratory failure  Active Problems:  HTN (hypertension)  History of lung cancer  Hypertriglyceridemia  CAD (coronary artery disease)  Atrial myxoma  Occlusion and stenosis of carotid artery without mention of cerebral infarction  Chest pain  Movement disorder  Metabolic alkalosis  COPD (chronic obstructive pulmonary disease)  Hx of pneumonectomy  Discharge Condition: stable  Diet recommendation: heart healthy  Filed Weights    09/02/13 1532  09/02/13 1738   Weight:  99.791 kg (220 lb)  98.431 kg (217 lb)   History of present illness:  80 y/o male with PMH of L pneumonectomy sq cell Ca 2000 chronic res failure/ prior 40 pack/yr smoker-now O2 dependent for the past 6-8 months with 2 L at rest, Diet-controlled diabetes mellitus (off metformin for the past 6-8 months with sugars ranging 106-120) , critical carotid artery disease R sdie s/p Endarterectomy 06/2005, L atrial myxoma s/p excision 06/2005, P a flutter, CABG x 1 06/2005, Admission 05/17/11 L sided weakness + numbness=TIA, Restless leg syndrome for the past year and has been on chronic magnesium for the past came to the hospital is that over the past 2-3 days patient has been having sudden episodes of jerking movements of upper and lower extremities without unilateral weakness found to have in acute on chronic resp failure  Hospital Course:  1. 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 Acute on chronic combined res failure; s/p pneumonectomy (lung Ca); COPD on home oxygen  -chronic CO2 retention; significantly improved on acetazolamide+diuretics+steroids;  -cont current regimen; reevaluate in 1-2 week as outpatient;  -initially thought may benefit with home BiPaP; but on further pulmonology evaluation they d/c BiPAP; d/w confirmed Dr. Michaelene Song  3. CAD (coronary artery disease)-see above discussion. Continue Plavix continue other medications  -chest pain on admission, resolved; per cardiology likely atypical;  -prognosis remains poor due to chronic respiratory failure, hypoxia, CO2 retention, s/p pneumonectomy  D/w patient, his wife at the bedside  Procedures:   none (i.e. Studies not automatically included, echos, thoracentesis, etc; not x-rays) ABG 09/04/13: 7.23/98/62 TRoponin x 3 negative    PORTABLE CHEST - 1 VIEW  COMPARISON: 09/02/2013  FINDINGS:  Opacification of left hemithorax is again seen and stable consistent  with a prior history. The right lung remains well aerated. Mild  vascular congestion is again identified. No sizable infiltrate is  seen.  IMPRESSION:  Vascular congestion on the right.  Postoperative changes on the left.  Electronically Signed  By: Inez Catalina M.D.  On: 09/06/2013 07:54   Consultations:  Pulmonology, cardiology  Discharge Exam:  Filed Vitals:    09/06/13 1232   BP:  132/77   Pulse:  79   Temp:  98.2 F (36.8 C)   Resp:  17   General: alert  Cardiovascular: s1,s2 rrr  Respiratory: improved AE  Discharge Instructions  Discharge Instructions    Diet - low sodium heart healthy  Complete by: As directed       Discharge instructions  Complete by: As directed    Please follow up with pulmonologist in 2 week s     Increase activity slowly  Complete by: As directed              Medication List         acetaminophen 325 MG tablet    Commonly known as: TYLENOL    Take 650 mg by mouth every 6  (six) hours as needed. For pain    acetaZOLAMIDE 250 MG tablet    Commonly known as: DIAMOX    Take 1 tablet (250 mg total) by mouth 2 (two) times daily.    albuterol 108 (90 BASE) MCG/ACT inhaler    Commonly known as: PROVENTIL HFA;VENTOLIN HFA    Inhale 2 puffs into the lungs every 6 (six) hours as needed for wheezing.    BYSTOLIC 10 MG tablet    Generic drug: nebivolol    TAKE 1 TABLET DAILY    cetirizine 10 MG tablet    Commonly known as: ZYRTEC    Take 10 mg by mouth daily as needed. For allergies    cholecalciferol 1000 UNITS tablet    Commonly known as: VITAMIN D    Take 2,000 Units by mouth 2 (two) times daily.    clopidogrel 75 MG tablet    Commonly known as: PLAVIX    Take 75 mg by mouth daily.    EPIPEN 2-PAK 0.3 mg/0.3 mL Soaj injection    Generic drug: EPINEPHrine    Inject 0.3 mg into the skin See admin instructions. Take as needed for severe allergic reaction    felodipine 10 MG 24 hr tablet    Commonly known as: PLENDIL    TAKE 1 TABLET ONCE A DAY    Fish Oil 1200 MG Caps    Take 1 capsule by mouth 2 (two) times daily.    ipratropium-albuterol 0.5-2.5 (3) MG/3ML Soln    Commonly known as: DUONEB    Take 3 mLs by nebulization every 6 (six) hours as needed.    losartan-hydrochlorothiazide 100-25 MG per tablet    Commonly known as: HYZAAR    TAKE 1 TABLET DAILY    magnesium oxide 400 MG tablet    Commonly known as: MAG-OX    Take 400 mg by mouth daily.    multivitamins ther. w/minerals Tabs tablet    Take 1 tablet by mouth daily.    pantoprazole 40 MG tablet    Commonly known as: PROTONIX    Take 1 tablet (40 mg total) by mouth daily.    predniSONE 10 MG tablet    Commonly known as: DELTASONE    Take 1 tablet (10 mg total) by mouth daily with breakfast.      Allergies   Allergen  Reactions   .  Statins  Other (See Comments)     Muscle aches   .  Amoxicillin  Rash        Follow-up Information    Follow up with [name], MD. Schedule an appointment  as soon as possible for a visit in 1 week.    Specialty: Family Medicine    Contact information:    [address]  [place] [address]  [phone]       Follow up with [facility], MD. Schedule an appointment as soon as possible for a visit in 2 weeks.    Specialty: Pulmonary Disease  Contact information:    [place] [address]  [phone]        OV 09/15/2013  Chief Complaint  Patient presents with  . Follow-up    HFU- Pt was d/c from [facility]. Pt states breathing has imrpoved since D/C. Denies CP. C/o morning dry cough. C/o mild SOB with exertion.    Presents with wife. Since hospitalization is doing betteer. Myoclonic jerks are resolved. He has never had primary pulmonary MD till now. They are loathe to return for close followup. Wife wishes to know etiology and early recognition of same. He is continuing nebs. I am not sure he and his wife know their meds well but they declined med calendar visit with NP. Advised 4 week followup from today but they declined that too.   11/15/2013 Follow up COPD  Patient returns for a followup. Patient underwent a spirometry on July 15 that showed FEV1 at 0.76 L, which was 26% of predicted, ratio 54. FVC of 34%. ABG showed decrease, PCO2 at 54 (, decreased from 98) Reviewed with pt and wife.  Hx of left pneumonectomy. Walks short distances on O2.  Can mow yards on riding mower.  Can walk in home without difficulty.  Only using Duoneb Twice daily  , we dicussed increasing frequency. Pt is resistant to increase nebs-" Im fine on my stuff now"  Patient denies any chest pain, orthopnea, PND, increased leg swelling, or hemoptysis Does not take diamox .     Review of Systems  Constitutional: Negative for fever and unexpected weight change.  HENT: Negative for congestion, dental problem, ear pain, nosebleeds, postnasal drip, rhinorrhea, sinus pressure, sneezing, sore throat and trouble swallowing.   Eyes: Negative for  redness and itching.  Respiratory: Positive  shortness of breath. Negative for chest tightness and wheezing.   Cardiovascular: Negative for palpitations and leg swelling.  Gastrointestinal: Negative for nausea and vomiting.  Genitourinary: Negative for dysuria.  Musculoskeletal: Negative for joint swelling.  Skin: Negative for rash.  Neurological: Negative for headaches.  Hematological: Does not bruise/bleed easily.  Psychiatric/Behavioral: Negative for dysphoric mood. The patient is not nervous/anxious.        Objective:   Physical Exam  Nursing note and vitals reviewed. Constitutional: He is oriented to person, place, and time. He appears  No distress.      HENT:  Head: Normocephalic and atraumatic.  Right Ear: External ear normal.  Left Ear: External ear normal.  Mouth/Throat: Oropharynx is clear and moist. No oropharyngeal exudate.  o2 on  1-2 l/m  Eyes: Conjunctivae and EOM are normal. Pupils are equal, round, and reactive to light. Right eye exhibits no discharge. Left eye exhibits no discharge. No scleral icterus.  Neck: Normal range of motion. Neck supple. No JVD present. No tracheal deviation present. No thyromegaly present.  Cardiovascular: Normal rate, regular rhythm and intact distal pulses.  Exam reveals no gallop and no friction rub.   No murmur heard. Pulmonary/Chest: Effort normal and breath sounds normal. No respiratory distress. He has no wheezes. He has no rales. He exhibits no tenderness.  Left shoulder droop No air entry on left side = s/p pneumonectomy Rt side overall diminished air entry  Abdominal: Soft. Bowel sounds are normal. He exhibits no distension and no mass. There is no tenderness. There is no rebound and no guarding.  Musculoskeletal: Normal range of motion. He exhibits no edema and no tenderness.  Walks with cane  Lymphadenopathy:    He has no cervical  adenopathy.  Neurological: He is alert and oriented to person, place, and time. He has normal  reflexes. No cranial nerve deficit. Coordination normal.  Skin: Skin is warm and dry. No rash noted. He is not diaphoretic. No erythema. No pallor.  Psychiatric: He has a normal mood and affect. His behavior is normal. Judgment and thought content normal.         Assessment & Plan:

## 2013-11-15 NOTE — Patient Instructions (Signed)
Increase DuoNeb Four times a day  (at least 3 times daily )  Wear Oxygen 2l/m continuously .  Follow up Dr. Chase Caller in 3-4 months and As needed

## 2013-11-15 NOTE — Assessment & Plan Note (Signed)
Severe COPD w/ chronic hypoxic/hypercarbic RF on o2  Maximize regimen   Plan  Increase DuoNeb Four times a day  (at least 3 times daily )  Wear Oxygen 2l/m continuously .  Follow up Dr. Chase Caller in 3-4 months and As needed

## 2014-01-10 ENCOUNTER — Ambulatory Visit (INDEPENDENT_AMBULATORY_CARE_PROVIDER_SITE_OTHER): Payer: Medicare Other | Admitting: Interventional Cardiology

## 2014-01-10 ENCOUNTER — Encounter: Payer: Self-pay | Admitting: Interventional Cardiology

## 2014-01-10 VITALS — BP 122/66 | HR 64 | Ht 70.0 in | Wt 218.0 lb

## 2014-01-10 DIAGNOSIS — I10 Essential (primary) hypertension: Secondary | ICD-10-CM

## 2014-01-10 DIAGNOSIS — I480 Paroxysmal atrial fibrillation: Secondary | ICD-10-CM

## 2014-01-10 DIAGNOSIS — I6529 Occlusion and stenosis of unspecified carotid artery: Secondary | ICD-10-CM

## 2014-01-10 DIAGNOSIS — E781 Pure hyperglyceridemia: Secondary | ICD-10-CM

## 2014-01-10 DIAGNOSIS — I2510 Atherosclerotic heart disease of native coronary artery without angina pectoris: Secondary | ICD-10-CM

## 2014-01-10 DIAGNOSIS — I4891 Unspecified atrial fibrillation: Secondary | ICD-10-CM | POA: Insufficient documentation

## 2014-01-10 DIAGNOSIS — D151 Benign neoplasm of heart: Secondary | ICD-10-CM

## 2014-01-10 MED ORDER — NEBIVOLOL HCL 10 MG PO TABS: 10.0000 mg | ORAL_TABLET | Freq: Every day | ORAL | Status: DC

## 2014-01-10 MED ORDER — FELODIPINE ER 10 MG PO TB24: 10.0000 mg | ORAL_TABLET | Freq: Every day | ORAL | Status: DC

## 2014-01-10 MED ORDER — LOSARTAN POTASSIUM-HCTZ 100-25 MG PO TABS: 1.0000 | ORAL_TABLET | Freq: Every day | ORAL | Status: DC

## 2014-01-10 MED ORDER — CLOPIDOGREL BISULFATE 75 MG PO TABS: 75.0000 mg | ORAL_TABLET | Freq: Every day | ORAL | Status: DC

## 2014-01-10 NOTE — Progress Notes (Signed)
Patient ID: Calvin [name], male   DOB: May 11, 1932, 80 y.o.   MRN: [id number]    [place], [address], [place]  [address] Phone: [phone] Fax:  [phone]  Date:  01/10/2014   ID:  Calvin [name], Calvin [name] [birth year], MRN [id number]  PCP:   [name], MD      History of Present Illness: Calvin [name] is a 80 y.o. male who has had CAD , COPD,and a intracardiac tumor which was removed. Still has some SOB with exertion. BP at home is in the 789/38 range systolic. Now on home oxygen. CAD/ASCVD:  c/o Dyspnea on exertion worse, now on oxygen all of the time; s/p left lung resection for cancer in 2000..  c/o Leg edema minimal.  Denies : Chest pain.  Nitroglycerin.  Palpitations.  Paroxysmal nocturnal dyspnea.  Syncope.  walking is limited by breathing.    Wt Readings from Last 3 Encounters:  01/10/14 218 lb (98.884 kg)  11/15/13 219 lb 9.6 oz (99.61 kg)  11/09/13 219 lb (99.338 kg)     Past Medical History  Diagnosis Date  . HTN (hypertension)   . History of lung cancer   . Hypertriglyceridemia   . CAD (coronary artery disease)   . Atrial myxoma     Left  . Carotid artery disease   . Hearing loss   . Diabetes mellitus   . Arthritis   . COPD (chronic obstructive pulmonary disease)   . Cancer     Current Outpatient Prescriptions  Medication Sig Dispense Refill  . acetaminophen (TYLENOL) 325 MG tablet Take 650 mg by mouth every 6 (six) hours as needed. For pain      . acetaZOLAMIDE (DIAMOX) 250 MG tablet Take 1 tablet (250 mg total) by mouth 2 (two) times daily.  30 tablet  0  . albuterol (PROVENTIL HFA;VENTOLIN HFA) 108 (90 BASE) MCG/ACT inhaler Inhale 2 puffs into the lungs every 6 (six) hours as needed for wheezing.      Marland Kitchen BYSTOLIC 10 MG tablet TAKE 1 TABLET DAILY  90 tablet  0  . cetirizine (ZYRTEC) 10 MG tablet Take 10 mg by mouth daily as needed. For allergies      . cholecalciferol (VITAMIN D) 1000 UNITS tablet Take 2,000 Units by mouth 2 (two) times daily.        . clopidogrel (PLAVIX) 75 MG tablet Take 75 mg by mouth daily.      Marland Kitchen EPIPEN 2-PAK 0.3 MG/0.3ML DEVI Inject 0.3 mg into the skin See admin instructions. Take as needed for severe allergic reaction      . felodipine (PLENDIL) 10 MG 24 hr tablet TAKE 1 TABLET ONCE A DAY  90 tablet  2  . ipratropium-albuterol (DUONEB) 0.5-2.5 (3) MG/3ML SOLN Take 3 mLs by nebulization 3 (three) times daily.      Marland Kitchen losartan-hydrochlorothiazide (HYZAAR) 100-25 MG per tablet TAKE 1 TABLET DAILY  90 tablet  0  . magnesium oxide (MAG-OX) 400 MG tablet Take 400 mg by mouth daily.      . Multiple Vitamins-Minerals (MULTIVITAMINS THER. W/MINERALS) TABS Take 1 tablet by mouth daily.      . Omega-3 Fatty Acids (FISH OIL) 1200 MG CAPS Take 1 capsule by mouth 2 (two) times daily.      . pantoprazole (PROTONIX) 40 MG tablet Take 1 tablet (40 mg total) by mouth daily.  21 tablet  0  . ranitidine (ZANTAC) 150 MG capsule Take 150 mg by mouth at bedtime  as needed.       No current facility-administered medications for this visit.    Allergies:    Allergies  Allergen Reactions  . Statins Other (See Comments)    Muscle aches  . Amoxicillin Rash    Social History:  The patient  reports that he quit smoking about 16 years ago. His smoking use included Cigarettes. He smoked 0.00 packs per day. He has quit using smokeless tobacco. He reports that he does not drink alcohol or use illicit drugs.   Family History:  The patient's family history includes Cancer in his father and sister; Heart attack in his mother; Heart disease in his mother and sister; Stomach cancer in his father.   ROS:  Please see the history of present illness.  No nausea, vomiting.  No fevers, chills.  No focal weakness.  No dysuria. DOE-stable   All other systems reviewed and negative.   PHYSICAL EXAM: VS:  BP 122/66  Pulse 64  Ht 5\' 10"  (1.778 m)  Wt 218 lb (98.884 kg)  BMI 31.28 kg/m2  SpO2 96% Well nourished, well developed, in no acute  distress HEENT: normal Neck: no JVD, no carotid bruits Cardiac:  normal S1, S2; RRR;  Lungs:  Decrease air exchange bilaterally Abd: soft, nontender, no hepatomegaly Ext: no edema Skin: warm and dry Neuro:   no focal abnormalities noted  EKG:  NSR in 5/15   ASSESSMENT AND PLAN:  Coronary atherosclerosis of native coronary artery  Continue Clopidogrel Bisulfate Tablet, 75 MG, 1 tablet, Orally, Once a day Notes: No angina.  Mostly limited by lung issues.   DOE stable.  Manter form for COPD.    2. Atrial flutter /fibrillation IMAGING: EKG    Stegall,Amy 01/08/2013 09:26:42 AM > VARANASI,JAY 01/08/2013 09:56:18 AM > NSR, inferior Q waves- no change in 5/15   Notes: Maintaining NSR.    3. Essential hypertension, benign  Continue Losartan Potassium-HCTZ Tablet, 100/25, 1 tablet, Orally, Once a day Continue Bystolic Tablet, 10MG , TAKE 1 TABLET ONCE A DAY Notes: Controlled   4. Hyperlipidemia  Stopped Zetia Tablet, 10 MG, 1 tablet, Orally, Once a day Notes: LDL 98. TG 222 in 2014, May.  Due for a recheck.  He prefers to get that done with Dr. Harrington Challenger.    Preventive Medicine  Adult topics discussed:  Diet: healthy diet.      Signed, Mina Marble, MD, [facility] 01/10/2014 9:46 AM

## 2014-01-10 NOTE — Patient Instructions (Signed)
Your physician recommends that you continue on your current medications as directed. Please refer to the Current Medication list given to you today.  Your physician wants you to follow-up in: 1 year with Dr. Varanasi. You will receive a reminder letter in the mail two months in advance. If you don't receive a letter, please call our office to schedule the follow-up appointment.  

## 2014-03-21 ENCOUNTER — Encounter: Payer: Self-pay | Admitting: Internal Medicine

## 2014-03-21 ENCOUNTER — Other Ambulatory Visit: Payer: Medicare Other

## 2014-03-21 ENCOUNTER — Ambulatory Visit (INDEPENDENT_AMBULATORY_CARE_PROVIDER_SITE_OTHER): Payer: Medicare Other | Admitting: Internal Medicine

## 2014-03-21 VITALS — BP 136/72 | HR 68 | Ht 70.0 in | Wt 225.0 lb

## 2014-03-21 DIAGNOSIS — I6529 Occlusion and stenosis of unspecified carotid artery: Secondary | ICD-10-CM

## 2014-03-21 DIAGNOSIS — J449 Chronic obstructive pulmonary disease, unspecified: Secondary | ICD-10-CM

## 2014-03-21 DIAGNOSIS — J9612 Chronic respiratory failure with hypercapnia: Secondary | ICD-10-CM

## 2014-03-21 MED ORDER — IPRATROPIUM-ALBUTEROL 0.5-2.5 (3) MG/3ML IN SOLN: 3.0000 mL | Freq: Three times a day (TID) | RESPIRATORY_TRACT | Status: DC

## 2014-03-21 NOTE — Progress Notes (Signed)
Subjective:    Patient ID: Calvin [name], male    DOB: 25-Jan-1933, 80 y.o.   MRN: [id number]  HPI    PCP  [name], MD   HPI Admit date: 09/02/2013  Discharge date: 09/06/2013  Time spent: >35 minutes  Recommendations for Outpatient Follow-up:  F/u with pulmonologist in 2 weeks  F/u with PCP in 1 week as needed  Discharge Diagnoses:  Principal Problem:  Acute-on-chronic respiratory failure  Active Problems:  HTN (hypertension)  History of lung cancer  Hypertriglyceridemia  CAD (coronary artery disease)  Atrial myxoma  Occlusion and stenosis of carotid artery without mention of cerebral infarction  Chest pain  Movement disorder  Metabolic alkalosis  COPD (chronic obstructive pulmonary disease)  Hx of pneumonectomy  Discharge Condition: stable  Diet recommendation: heart healthy  Filed Weights    09/02/13 1532  09/02/13 1738   Weight:  99.791 kg (220 lb)  98.431 kg (217 lb)   History of present illness:  80 y/o male with PMH of L pneumonectomy sq cell Ca 2000 chronic res failure/ prior 40 pack/yr smoker-now O2 dependent for the past 6-8 months with 2 L at rest, Diet-controlled diabetes mellitus (off metformin for the past 6-8 months with sugars ranging 106-120) , critical carotid artery disease R sdie s/p Endarterectomy 06/2005, L atrial myxoma s/p excision 06/2005, P a flutter, CABG x 1 06/2005, Admission 05/17/11 L sided weakness + numbness=TIA, Restless leg syndrome for the past year and has been on chronic magnesium for the past came to the hospital is that over the past 2-3 days patient has been having sudden episodes of jerking movements of upper and lower extremities without unilateral weakness found to have in acute on chronic resp failure  Hospital Course:  1. "-"Myoclonus thought likely due to chronic res failure, CO2 retention, hypoxia  -neuro exam no focal; CT: No intracranial hemorrhage or CT evidence of large acute infarct  -no new symptoms; resolved; per neurology  no further evaluation  2. Acute on chronic combined res failure; s/p pneumonectomy (lung Ca); COPD on home oxygen  -chronic CO2 retention; significantly improved on acetazolamide+diuretics+steroids;  -cont current regimen; reevaluate in 1-2 week as outpatient;  -initially thought may benefit with home BiPaP; but on further pulmonology evaluation they d/c BiPAP; d/w confirmed Dr. Michaelene Song  3. CAD (coronary artery disease)-see above discussion. Continue Plavix continue other medications  -chest pain on admission, resolved; per cardiology likely atypical;  -prognosis remains poor due to chronic respiratory failure, hypoxia, CO2 retention, s/p pneumonectomy  D/w patient, his wife at the bedside  Procedures:   none (i.e. Studies not automatically included, echos, thoracentesis, etc; not x-rays) ABG 09/04/13: 7.23/98/62 TRoponin x 3 negative    PORTABLE CHEST - 1 VIEW  COMPARISON: 09/02/2013  FINDINGS:  Opacification of left hemithorax is again seen and stable consistent  with a prior history. The right lung remains well aerated. Mild  vascular congestion is again identified. No sizable infiltrate is  seen.  IMPRESSION:  Vascular congestion on the right.  Postoperative changes on the left.  Electronically Signed  By: Inez Catalina M.D.  On: 09/06/2013 07:54    OV 09/15/2013  Chief Complaint  Patient presents with  . Follow-up    HFU- Pt was d/c from [facility]. Pt states breathing has imrpoved since D/C. Denies CP. C/o morning dry cough. C/o mild SOB with exertion.    Presents with wife. Since hospitalization is doing betteer. Myoclonic jerks are resolved. He has never had primary pulmonary MD  till now. They are loathe to return for close followup. Wife wishes to know etiology and early recognition of same. He is continuing nebs. I am not sure he and his wife know their meds well but they declined med calendar visit with NP. Advised 4 week followup from today but they declined that too.    11/15/2013 Follow up COPD  Patient returns for a followup. Patient underwent a spirometry on July 15 that showed FEV1 at 0.76 L, which was 26% of predicted, ratio 54. FVC of 34%. ABG showed decrease, PCO2 at 54 (, decreased from 98) Reviewed with pt and wife.  Hx of left pneumonectomy. Walks short distances on O2.  Can mow yards on riding mower.  Can walk in home without difficulty.  Only using Duoneb Twice daily  , we dicussed increasing frequency. Pt is resistant to increase nebs-" Im fine on my stuff now"  Patient denies any chest pain, orthopnea, PND, increased leg swelling, or hemoptysis Does not take diamox .      OV  03/21/2014  Chief Complaint  Patient presents with  . Follow-up    Pt states his breathing is unchanged since last OV. Pt c/o morning productive cough. Pt denies CP/tightness.    Follow-up COPD, history of pneumonectomy on the left with chronic respiratory failure and baseline lung function of FEV1 0.8 L/26% with a ratio 54 very severe obstructive lung disease Gold stage IV COPD   He is here with his wife. Overall the reports stability in lung disease. No new issues. No hospitalizations or emergency room visits or redness on use or urgent care visits. No antibiotic use. He is frustrated that he has to take his nebulizers lifelong but I have reeducated him about this. They said that he is up-to-date with his vaccines including influenza and pneumonia. Review of his chart indicates that he has not had alpha-1 antitrypsin checked so far    Immunization History  Administered Date(s) Administered  . Influenza Split 01/20/2013, 01/20/2014   Smoking:  reports that he quit smoking about 16 years ago. His smoking use included Cigarettes. He smoked 0.00 packs per day. He has quit using smokeless tobacco.  Past hx: reviewed> No changes since last visit  Review of Systems  Constitutional: Negative for fever and unexpected weight change.  HENT: Positive for congestion.  Negative for dental problem, ear pain, nosebleeds, postnasal drip, rhinorrhea, sinus pressure, sneezing, sore throat and trouble swallowing.   Eyes: Negative for redness and itching.  Respiratory: Positive for cough and shortness of breath. Negative for chest tightness and wheezing.   Cardiovascular: Negative for palpitations and leg swelling.  Gastrointestinal: Negative for nausea and vomiting.  Genitourinary: Negative for dysuria.  Musculoskeletal: Negative for joint swelling.  Skin: Negative for rash.  Neurological: Negative for headaches.  Hematological: Does not bruise/bleed easily.  Psychiatric/Behavioral: Negative for dysphoric mood. The patient is not nervous/anxious.    Current outpatient prescriptions: acetaZOLAMIDE (DIAMOX) 250 MG tablet, Take 1 tablet (250 mg total) by mouth 2 (two) times daily., Disp: 30 tablet, Rfl: 0;  albuterol (PROVENTIL HFA;VENTOLIN HFA) 108 (90 BASE) MCG/ACT inhaler, Inhale 2 puffs into the lungs every 6 (six) hours as needed for wheezing., Disp: , Rfl: ;  cetirizine (ZYRTEC) 10 MG tablet, Take 10 mg by mouth daily as needed. For allergies, Disp: , Rfl:  cholecalciferol (VITAMIN D) 1000 UNITS tablet, Take 2,000 Units by mouth 2 (two) times daily. , Disp: , Rfl: ;  clopidogrel (PLAVIX) 75 MG tablet, Take 1 tablet (  75 mg total) by mouth daily., Disp: 90 tablet, Rfl: 3;  EPIPEN 2-PAK 0.3 MG/0.3ML DEVI, Inject 0.3 mg into the skin See admin instructions. Take as needed for severe allergic reaction, Disp: , Rfl:  felodipine (PLENDIL) 10 MG 24 hr tablet, Take 1 tablet (10 mg total) by mouth daily., Disp: 90 tablet, Rfl: 3;  ipratropium-albuterol (DUONEB) 0.5-2.5 (3) MG/3ML SOLN, Take 3 mLs by nebulization 3 (three) times daily., Disp: , Rfl: ;  losartan-hydrochlorothiazide (HYZAAR) 100-25 MG per tablet, Take 1 tablet by mouth daily., Disp: 90 tablet, Rfl: 3;  magnesium oxide (MAG-OX) 400 MG tablet, Take 400 mg by mouth daily., Disp: , Rfl:  Multiple Vitamins-Minerals  (MULTIVITAMINS THER. W/MINERALS) TABS, Take 1 tablet by mouth daily., Disp: , Rfl: ;  nebivolol (BYSTOLIC) 10 MG tablet, Take 1 tablet (10 mg total) by mouth daily., Disp: 90 tablet, Rfl: 3;  Omega-3 Fatty Acids (FISH OIL) 1200 MG CAPS, Take 1 capsule by mouth 2 (two) times daily., Disp: , Rfl: ;  pantoprazole (PROTONIX) 40 MG tablet, Take 1 tablet (40 mg total) by mouth daily., Disp: 21 tablet, Rfl: 0 ranitidine (ZANTAC) 150 MG capsule, Take 150 mg by mouth at bedtime as needed., Disp: , Rfl: ;  acetaminophen (TYLENOL) 325 MG tablet, Take 650 mg by mouth every 6 (six) hours as needed. For pain, Disp: , Rfl:       Objective:   Physical Exam  Filed Vitals:   03/21/14 1017  BP: 136/72  Pulse: 68  Height: 5\' 10"  (1.778 m)  Weight: 225 lb (102.059 kg)  SpO2: 91%    ursing note and vitals reviewed. Constitutional: He is oriented to person, place, and time. He appears  No distress.      HENT:  Head: Normocephalic and atraumatic.  Right Ear: External ear normal.  Left Ear: External ear normal.  Mouth/Throat: Oropharynx is clear and moist. No oropharyngeal exudate.  o2 on  1-2 l/m  Eyes: Conjunctivae and EOM are normal. Pupils are equal, round, and reactive to light. Right eye exhibits no discharge. Left eye exhibits no discharge. No scleral icterus.  Neck: Normal range of motion. Neck supple. No JVD present. No tracheal deviation present. No thyromegaly present.  Cardiovascular: Normal rate, regular rhythm and intact distal pulses.  Exam reveals no gallop and no friction rub.   No murmur heard. Pulmonary/Chest: Effort normal and breath sounds normal. No respiratory distress. He has no wheezes. He has no rales. He exhibits no tenderness.  Left shoulder droop No air entry on left side = s/p pneumonectomy Rt side overall diminished air entry  Abdominal: Soft. Bowel sounds are normal. He exhibits no distension and no mass. There is no tenderness. There is no rebound and no guarding.    Musculoskeletal: Normal range of motion. He exhibits no edema and no tenderness.  Walks with cane  Lymphadenopathy:    He has no cervical adenopathy.  Neurological: He is alert and oriented to person, place, and time. He has normal reflexes. No cranial nerve deficit. Coordination normal.  Skin: Skin is warm and dry. No rash noted. He is not diaphoretic. No erythema. No pallor.  Psychiatric: He has a normal mood and affect. His behavior is normal. Judgment and thought content normal.       Assessment & Plan:     ICD-9-CM ICD-10-CM   1. COPD, very severe 496 J44.9 Alpha-1 antitrypsin phenotype  2. Hypercapnic respiratory failure, chronic 518.83 J96.12 Alpha-1 antitrypsin phenotype  Overall disease is stable  - Continue  using DuoNeb as scheduled - Continue oxygen as scheduled -Nurse will update our database with pneumonia vaccine history - Give blood for Alpha 1 antitrypsin check for genetic cause of copd - ANy breathing issues - always call us anytime [phone] - Need to have goals of care discussion in future  Followup  3 months with NP Tammy    Dr. Brand Males, M.D., [facility].C.P Pulmonary and Critical Care Medicine Staff Physician [facility] Pager: [phone], If no answer or between  15:00h - 7:00h: call [phone]  03/21/2014 10:42 AM

## 2014-03-21 NOTE — Addendum Note (Signed)
Addended by: Devona Konig on: 03/21/2014 10:44 AM   Modules accepted: Orders

## 2014-03-21 NOTE — Patient Instructions (Addendum)
ICD-9-CM ICD-10-CM   1. COPD, very severe 496 J44.9   2. Hypercapnic respiratory failure, chronic 518.83 J96.12    - Overall disease is stable  - Continue using DuoNeb as scheduled - Continue oxygen as scheduled -Nurse will update our database with pneumonia vaccine history - Give blood for Alpha 1 antitrypsin check for genetic cause of copd - ANy breathing issues - always call us anytime [phone]  Followup  3 months with NP Tammy

## 2014-03-25 LAB — ALPHA-1 ANTITRYPSIN PHENOTYPE: A1 ANTITRYPSIN: 147 mg/dL (ref 83–199)

## 2014-04-01 NOTE — Progress Notes (Signed)
Quick Note:  Called and spoke to pt. Informed pt of the results and recs per MR. Pt verbalized understanding and denied any further questions or concerns at this time. ______

## 2014-04-25 ENCOUNTER — Telehealth: Payer: Self-pay | Admitting: Internal Medicine

## 2014-04-25 NOTE — Telephone Encounter (Signed)
Received form. Immunizations update in chart. Order for ipratropium neb solution faxed to [facility]. Nothing further needed.

## 2014-04-29 ENCOUNTER — Telehealth: Payer: Self-pay | Admitting: Internal Medicine

## 2014-04-29 MED ORDER — IPRATROPIUM-ALBUTEROL 0.5-2.5 (3) MG/3ML IN SOLN: 3.0000 mL | Freq: Three times a day (TID) | RESPIRATORY_TRACT | Status: DC

## 2014-04-29 NOTE — Telephone Encounter (Signed)
Called and spoke with pts wife and she stated that she dropped off a letter from medicare on Monday and it stated that they will not cover the pts  Nebulizer medications and that the rx needed to have the dx code on it.  i have resent this to the pharmacy with the dx code on it but the letter will need to be sent back to medicare.  MR did you get this letter from the pt?

## 2014-05-02 NOTE — Telephone Encounter (Signed)
Called and spoke to pt's wife, Calvin [name]. Calvin [name] stated she is having to pay over $100 for the pt's duoneb when typically she she has only had to pay $10. Called and spoke to pharmacist at [facility]. I was informed that the pt's insurance will need to be updated and the elevated pay was the "out of pocket" pay. Called and spoke to [place] and informed her to call [facility] and give the insurance information to update the account. Calvin [name] verbalized understanding and denied any further questions or concerns at this time.

## 2014-05-02 NOTE — Telephone Encounter (Signed)
Pt is calling back again wants this taken care of she doesn't understand why we are taking so long

## 2014-05-17 ENCOUNTER — Other Ambulatory Visit: Payer: Self-pay | Admitting: Cardiothoracic Surgery

## 2014-05-17 DIAGNOSIS — J449 Chronic obstructive pulmonary disease, unspecified: Secondary | ICD-10-CM

## 2014-05-19 ENCOUNTER — Encounter: Payer: Self-pay | Admitting: Cardiothoracic Surgery

## 2014-05-19 ENCOUNTER — Ambulatory Visit
Admission: RE | Admit: 2014-05-19 | Discharge: 2014-05-19 | Disposition: A | Discharge status: Home / Self Care | Payer: Medicare Other | Source: Ambulatory Visit | Attending: Cardiothoracic Surgery | Admitting: Cardiothoracic Surgery

## 2014-05-19 ENCOUNTER — Ambulatory Visit (INDEPENDENT_AMBULATORY_CARE_PROVIDER_SITE_OTHER): Payer: Medicare Other | Admitting: Cardiothoracic Surgery

## 2014-05-19 VITALS — BP 124/64 | HR 64 | Resp 20 | Ht 70.0 in | Wt 221.0 lb

## 2014-05-19 DIAGNOSIS — Z951 Presence of aortocoronary bypass graft: Secondary | ICD-10-CM

## 2014-05-19 DIAGNOSIS — J449 Chronic obstructive pulmonary disease, unspecified: Secondary | ICD-10-CM

## 2014-05-19 DIAGNOSIS — Z85118 Personal history of other malignant neoplasm of bronchus and lung: Secondary | ICD-10-CM

## 2014-05-19 DIAGNOSIS — D151 Benign neoplasm of heart: Secondary | ICD-10-CM

## 2014-05-19 NOTE — Progress Notes (Signed)
[facility] Record #[id number] Date of Birth: 14-Jun-1932  Referring: Jettie Booze, MD Primary Care:  [name], MD  Chief Complaint:   Follow up from Lung CA and myxoma  History of Present Illness:     Patient is a 81 year old who 16years ago underwent left pneumonectomy for a squamous cell carcinoma. 9 years ago he underwent coronary bypass grafting and resection of atrial myxoma. He has known underlying pulmonary disease is currently somewhat limited by shortness of breath.   He uses oxygen continuously.  Since last seen h e has had admission for respiratory insufficiency with pco2 increased to 90's.He is unable to take statins because of muscle pain. I have reviewed Dr Ileana Roup notes concerning his increased respiratory problems.  Marland Kitchen He returns today for followup visit and chest x-ray after previous pneumonectomy.    Past Medical History  Diagnosis Date  . HTN (hypertension)   . History of lung cancer   . Hypertriglyceridemia   . CAD (coronary artery disease)   . Atrial myxoma     Left  . Carotid artery disease   . Hearing loss   . Diabetes mellitus   . Arthritis   . COPD (chronic obstructive pulmonary disease)   . Cancer      History  Smoking status  . Former Smoker  . Types: Cigarettes  . Quit date: 07/21/1997  Smokeless tobacco  . Former User    History  Alcohol Use No     Allergies  Allergen Reactions  . Statins Other (See Comments)    Muscle aches  . Amoxicillin Rash    Current Outpatient Prescriptions  Medication Sig Dispense Refill  . acetaminophen (TYLENOL) 325 MG tablet Take 650 mg by mouth every 6 (six) hours as needed. For pain    . acetaZOLAMIDE (DIAMOX) 250 MG tablet Take 1 tablet (250 mg total) by mouth 2 (two) times daily. 30 tablet 0  . albuterol (PROVENTIL HFA;VENTOLIN HFA) 108 (90 BASE) MCG/ACT inhaler Inhale 2 puffs into the lungs every 6 (six) hours as needed for wheezing.    . cetirizine  (ZYRTEC) 10 MG tablet Take 10 mg by mouth daily as needed. For allergies    . cholecalciferol (VITAMIN D) 1000 UNITS tablet Take 2,000 Units by mouth 2 (two) times daily.     . clopidogrel (PLAVIX) 75 MG tablet Take 1 tablet (75 mg total) by mouth daily. 90 tablet 3  . EPIPEN 2-PAK 0.3 MG/0.3ML DEVI Inject 0.3 mg into the skin See admin instructions. Take as needed for severe allergic reaction    . felodipine (PLENDIL) 10 MG 24 hr tablet Take 1 tablet (10 mg total) by mouth daily. 90 tablet 3  . ipratropium-albuterol (DUONEB) 0.5-2.5 (3) MG/3ML SOLN Take 3 mLs by nebulization 3 (three) times daily. 270 mL 5  . losartan-hydrochlorothiazide (HYZAAR) 100-25 MG per tablet Take 1 tablet by mouth daily. 90 tablet 3  . magnesium oxide (MAG-OX) 400 MG tablet Take 400 mg by mouth daily.    . metFORMIN (GLUCOPHAGE) 500 MG tablet Take 500 mg by mouth daily with breakfast.     . Multiple Vitamins-Minerals (MULTIVITAMINS THER. W/MINERALS) TABS Take 1 tablet by mouth daily.    . nebivolol (BYSTOLIC) 10 MG tablet Take 1 tablet (10 mg total) by mouth daily. 90 tablet 3  . Omega-3 Fatty Acids (FISH OIL) 1200 MG CAPS Take 1 capsule by  mouth 2 (two) times daily.    . pantoprazole (PROTONIX) 40 MG tablet Take 1 tablet (40 mg total) by mouth daily. 21 tablet 0  . ranitidine (ZANTAC) 150 MG capsule Take 150 mg by mouth at bedtime as needed.     No current facility-administered medications for this visit.       Physical Exam: BP 124/64 mmHg  Pulse 64  Resp 20  Ht 5\' 10"  (1.778 m)  Wt 221 lb (100.245 kg)  BMI 31.71 kg/m2  SpO2 93%  General appearance: alert, cooperative and no distress Neurologic: intact Heart: regular rate and rhythm, S1, S2 normal, no murmur, click, rub or gallop Lungs: diminished breath sounds left chest Abdomen: soft, non-tender; bowel sounds normal; no masses,  no organomegaly Extremities: extremities normal, atraumatic, no cyanosis or edema, no edema, redness or tenderness in the  calves or thighs and no ulcers, gangrene or trophic changes Patient has no cervical supraclavicular adenopathy  Diagnostic Studies & Laboratory data:     Recent Radiology Findings:   Dg Chest 2 View  05/19/2014   CLINICAL DATA:  Shortness of breath.  Prior left pneumonectomy.  EXAM: CHEST  2 VIEW  COMPARISON:  09/06/2013.  06/19/2012.  FINDINGS: Mediastinum and hilar structures are stable. Prior CABG. Heart size stable. Left pneumonectomy. Calcified left fibrothorax. Right lung is clear. No pleural effusion pneumothorax.  IMPRESSION: 1. No acute cardiopulmonary disease.  Chest appears stable. 2. Left pneumonectomy with old calcified left fibrothorax. No interim change. 3. Prior CABG.  Heart size stable.  No pulmonary venous congestion.   Electronically Signed   By: Marcello Moores  Register   On: 05/19/2014 10:57    I have independently reviewed the above radiology studies  and reviewed the findings with the patient.   Recent Lab Findings: Lab Results  Component Value Date   WBC 10.6* 09/06/2013   HGB 12.3* 09/06/2013   HCT 38.6* 09/06/2013   PLT 203 09/06/2013   GLUCOSE 168* 09/06/2013   CHOL 238* 05/18/2011   TRIG 291* 05/18/2011   HDL 36* 05/18/2011   LDLCALC 144* 05/18/2011   ALT 21 09/02/2013   AST 19 09/02/2013   NA 136* 09/06/2013   K 4.2 09/06/2013   CL 92* 09/06/2013   CREATININE 1.19 09/06/2013   BUN 36* 09/06/2013   CO2 35* 09/06/2013   TSH 2.330 09/02/2013   INR 1.01 09/03/2013   HGBA1C 6.0* 05/18/2011   ECHO 2013: last echo [facility]* [address] [place], [place] [address] [phone]  ------------------------------------------------------------ Echocardiography  Patient: [place], [place] MR #: [id number] Study Date: 05/18/2011 Gender: M Age: 74 Height: 180.3cm Weight: 95kg BSA: 2.55m^2 Pt. Status: Room: [facility], Ec SONOGRAPHER Delman Kitten, RCS ORDERING [place], [place] ADMITTING  [place], [place] ATTENDING [place], [place] cc:  ------------------------------------------------------------ LV EF: 55% - 60%  ------------------------------------------------------------ Indications: CVA 80.  ------------------------------------------------------------ History: PMH: Coronary artery disease. Risk factors: Former tobacco use. Hypertension.  ------------------------------------------------------------ Study Conclusions  - Left ventricle: The cavity size was normal. Systolic function was normal. The estimated ejection fraction was in the range of 55% to 60%. - Left atrium: There is no evidence of left atrial myxoma. Impressions:  - No cardiac source of emboli was indentified. Recommendations: Consider transesophageal echocardiography if clinically indicated. Echocardiography. M-mode, complete 2D, spectral Doppler, and color Doppler. Height: Height: 180.3cm. Height: 71in. Weight: Weight: 95kg. Weight: 209lb. Body mass index: BMI: 29.2kg/m^2. Body surface area: BSA: 2.37m^2. Blood pressure: 136/73. Patient status: Inpatient. Location:  Bedside.  ------------------------------------------------------------  ------------------------------------------------------------ Left ventricle: The cavity size was normal. Systolic function was normal. The estimated ejection fraction was in the range of 55% to 60%. Septal bounce was present but there was no Doppler evidence of restrictive or constrictive physiology.  ------------------------------------------------------------ Aortic valve: Mildly thickened, mildly calcified leaflets. Cusp separation was normal. Doppler: Transvalvular velocity was within the normal range. There was no stenosis. No regurgitation.  ------------------------------------------------------------ Aorta: Ascending aorta: The ascending aorta was mildly dilated.  ------------------------------------------------------------ Mitral valve: Structurally  normal valve. Leaflet separation was normal. Doppler: Transvalvular velocity was within the normal range. There was no evidence for stenosis. Trivial regurgitation. Peak gradient: 34mm Hg (D).  ------------------------------------------------------------ Left atrium: The atrium was normal in size. There is no evidence of left atrial myxoma.  ------------------------------------------------------------ Right ventricle: The cavity size was normal. Wall thickness was normal. Systolic function was normal.  ------------------------------------------------------------ Pulmonic valve: Structurally normal valve. Cusp separation was normal. Doppler: Transvalvular velocity was within the normal range. Trivial regurgitation.  ------------------------------------------------------------ Tricuspid valve: Structurally normal valve. Leaflet separation was normal. Doppler: Transvalvular velocity was within the normal range. No regurgitation.  ------------------------------------------------------------ Pulmonary artery: The main pulmonary artery was normal-sized.  ------------------------------------------------------------ Right atrium: The atrium was normal in size.  ------------------------------------------------------------ Pericardium: The pericardium was normal in appearance. There was no pericardial effusion.  ------------------------------------------------------------ Systemic veins: Inferior vena cava: The vessel was normal in size; the respirophasic diameter changes were in the normal range (= 50%); findings are consistent with normal central venous pressure.  ------------------------------------------------------------  2D measurements Normal Doppler Normal Left ventricle measurements LVID ED, 44.6 mm 43-52 Left ventricle chord, Ea, lat 9.65 cm/ ------- PLAX ann, tiss s LVID ES, 31.1 mm 23-38 DP chord, E/Ea, lat 11.5 ------- PLAX ann, tiss FS, chord, 30 % >29 DP PLAX  Ea, med 10.1 cm/ ------- LVPW, ED 11.1 mm ------ ann, tiss s IVS/LVPW 1.05 <1.3 DP ratio, ED E/Ea, med 10.99 ------- Ventricular septum ann, tiss IVS, ED 11.7 mm ------ DP Aorta Mitral valve Root diam, 42 mm ------ Peak E vel 111 cm/ ------- ED s Left atrium Peak A vel 137 cm/ ------- AP dim 38 mm ------ s AP dim 1.73 cm/m^2 <2.2 Deceleratio 232 ms 150-230 index n time Peak 5 mm ------- gradient, D Hg Peak E/A 0.8 ------- ratio  ------------------------------------------------------------ Prepared and Electronically Authenticated by  Candee Furbish, MD 2013-01-26T20:10:42.753   Assessment / Plan:      Patient is stable now 16  years after left pneumonectomy for 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 No evidence of recurrance Atrial Myxoma resected in 2007 no evidence of recurrence on echo 2013  He continues on home oxygen continuously, but continues to work around his house  Unable to take statins, he notes cardiology has been working on this with elevated cholesterol and LDL  Plan see the patient back in one year,  With chest x-ray  Grace Isaac MD  Beeper [phone] Office [phone] 05/19/2014 11:24 AM

## 2014-06-30 ENCOUNTER — Ambulatory Visit: Payer: Medicare Other | Admitting: Cardiothoracic Surgery

## 2014-08-31 ENCOUNTER — Encounter: Payer: Self-pay | Admitting: Internal Medicine

## 2014-08-31 ENCOUNTER — Ambulatory Visit (INDEPENDENT_AMBULATORY_CARE_PROVIDER_SITE_OTHER): Payer: Medicare Other | Admitting: Internal Medicine

## 2014-08-31 VITALS — BP 124/64 | HR 70 | Ht 70.0 in | Wt 220.0 lb

## 2014-08-31 DIAGNOSIS — J9612 Chronic respiratory failure with hypercapnia: Secondary | ICD-10-CM | POA: Diagnosis not present

## 2014-08-31 DIAGNOSIS — J449 Chronic obstructive pulmonary disease, unspecified: Secondary | ICD-10-CM

## 2014-08-31 NOTE — Progress Notes (Signed)
Subjective:    Patient ID: Calvin [name], male    DOB: 1932-10-05, 81 y.o.   MRN: [id number]  HPI   OV 08/31/2014  Chief Complaint  Patient presents with  . Follow-up    Pt stated his breathing is doing well. Pt c/o mild prod cough in morning and PND. Pt denies CP/tightness.     Follow-up COPD, history of pneumonectomy on the left with chronic respiratory failure and baseline lung function of FEV1 0.8 L/26% with a ratio 54 very severe obstructive lung disease Gold stage IV COPD.   Presents with his wife. She gives most of the history. He is hard of hearing. Overall it appears that he is doing real well. He is mowing the lawn with his lawnmower that is a sitting lawnmower. He uses his oxygen and nebulizers regularly. There is no decompensation with his edema or shortness of breath or baseline cough. Everything is stable. He feels well. He does not want to attend pulmonary rehabilitation. He is content taking care of his garden   Alpha 1 MM reviewed. This was done last time it is MM and normal.        has a past medical history of HTN (hypertension); History of lung cancer; Hypertriglyceridemia; CAD (coronary artery disease); Atrial myxoma; Carotid artery disease; Hearing loss; Diabetes mellitus; Arthritis; COPD (chronic obstructive pulmonary disease); and Cancer.   reports that he quit smoking about 17 years ago. His smoking use included Cigarettes. He has quit using smokeless tobacco.  Past Surgical History  Procedure Laterality Date  . Cabg x 1 with reverse saphenous vein graft to distal rt coronary artery and excision of left atrial myxoma  07/12/2005    Dr Servando Snare  . Left pneumonectomy  05/03/1998    Dr Servando Snare  . Combined mediastinoscopy and bronchoscopy  05/01/1998    Dr Servando Snare  . Rt  carotid endarterectomy      Dr Kellie Simmering  . Coronary artery bypass graft    . Carotid endarterectomy  2007    Right CEA  . Lung removal, partial  2000    Left lung removed      Allergies  Allergen Reactions  . Statins Other (See Comments)    Muscle aches  . Amoxicillin Rash    Immunization History  Administered Date(s) Administered  . Influenza Split 01/20/2013, 01/27/2014  . Pneumococcal Conjugate-13 09/22/2013  . Pneumococcal Polysaccharide-23 05/29/1998, 11/10/2007  . Td 08/15/2003  . Tdap 03/24/2014    Family History  Problem Relation Age of Onset  . Heart disease Mother     NOT before age 31  . Heart attack Mother   . Heart disease Sister   . Cancer Sister     Breast  . Stomach cancer Father   . Cancer Father     Stomach     Current outpatient prescriptions:  .  acetaminophen (TYLENOL) 325 MG tablet, Take 650 mg by mouth every 6 (six) hours as needed. For pain, Disp: , Rfl:  .  acetaZOLAMIDE (DIAMOX) 250 MG tablet, Take 1 tablet (250 mg total) by mouth 2 (two) times daily., Disp: 30 tablet, Rfl: 0 .  albuterol (PROVENTIL HFA;VENTOLIN HFA) 108 (90 BASE) MCG/ACT inhaler, Inhale 2 puffs into the lungs every 6 (six) hours as needed for wheezing., Disp: , Rfl:  .  cetirizine (ZYRTEC) 10 MG tablet, Take 10 mg by mouth daily as needed. For allergies, Disp: , Rfl:  .  cholecalciferol (VITAMIN D) 1000 UNITS tablet, Take 2,000 Units by mouth  2 (two) times daily. , Disp: , Rfl:  .  clopidogrel (PLAVIX) 75 MG tablet, Take 1 tablet (75 mg total) by mouth daily., Disp: 90 tablet, Rfl: 3 .  EPIPEN 2-PAK 0.3 MG/0.3ML DEVI, Inject 0.3 mg into the skin See admin instructions. Take as needed for severe allergic reaction, Disp: , Rfl:  .  felodipine (PLENDIL) 10 MG 24 hr tablet, Take 1 tablet (10 mg total) by mouth daily., Disp: 90 tablet, Rfl: 3 .  ipratropium-albuterol (DUONEB) 0.5-2.5 (3) MG/3ML SOLN, Take 3 mLs by nebulization 3 (three) times daily., Disp: 270 mL, Rfl: 5 .  losartan-hydrochlorothiazide (HYZAAR) 100-25 MG per tablet, Take 1 tablet by mouth daily., Disp: 90 tablet, Rfl: 3 .  magnesium oxide (MAG-OX) 400 MG tablet, Take 400 mg by mouth  daily., Disp: , Rfl:  .  metFORMIN (GLUCOPHAGE) 500 MG tablet, Take 500 mg by mouth daily with breakfast. , Disp: , Rfl:  .  Multiple Vitamins-Minerals (MULTIVITAMINS THER. W/MINERALS) TABS, Take 1 tablet by mouth daily., Disp: , Rfl:  .  nebivolol (BYSTOLIC) 10 MG tablet, Take 1 tablet (10 mg total) by mouth daily., Disp: 90 tablet, Rfl: 3 .  Omega-3 Fatty Acids (FISH OIL) 1200 MG CAPS, Take 1 capsule by mouth 2 (two) times daily., Disp: , Rfl:  .  ranitidine (ZANTAC) 150 MG capsule, Take 150 mg by mouth at bedtime as needed., Disp: , Rfl:     Review of Systems  Constitutional: Negative for fever and unexpected weight change.  HENT: Positive for postnasal drip. Negative for congestion, dental problem, ear pain, nosebleeds, rhinorrhea, sinus pressure, sneezing, sore throat and trouble swallowing.   Eyes: Negative for redness and itching.  Respiratory: Positive for cough and shortness of breath. Negative for chest tightness and wheezing.   Cardiovascular: Negative for palpitations and leg swelling.  Gastrointestinal: Negative for nausea and vomiting.  Genitourinary: Negative for dysuria.  Musculoskeletal: Negative for joint swelling.  Skin: Negative for rash.  Neurological: Positive for headaches.  Hematological: Does not bruise/bleed easily.  Psychiatric/Behavioral: Negative for dysphoric mood. The patient is not nervous/anxious.        Objective:   Physical Exam  Constitutional: He is oriented to person, place, and time. He appears well-developed and well-nourished. No distress.  Looks well  HENT:  Head: Normocephalic and atraumatic.  Right Ear: External ear normal.  Left Ear: External ear normal.  Mouth/Throat: Oropharynx is clear and moist. No oropharyngeal exudate.  Oxygen on  Eyes: Conjunctivae and EOM are normal. Pupils are equal, round, and reactive to light. Right eye exhibits no discharge. Left eye exhibits no discharge. No scleral icterus.  Neck: Normal range of motion.  Neck supple. No JVD present. No tracheal deviation present. No thyromegaly present.  Cardiovascular: Normal rate, regular rhythm and intact distal pulses.  Exam reveals no gallop and no friction rub.   No murmur heard. Pulmonary/Chest: Effort normal and breath sounds normal. No respiratory distress. He has no wheezes. He has no rales. He exhibits no tenderness.  Right chest is due because of pneumonectomy Barrel chest Mild purse lip breathing Overall air entry diminished No accessory muscle use or wheeze  Abdominal: Soft. Bowel sounds are normal. He exhibits no distension and no mass. There is no tenderness. There is no rebound and no guarding.  Musculoskeletal: Normal range of motion. He exhibits edema. He exhibits no tenderness.  1+ pedal edema bilaterally  Lymphadenopathy:    He has no cervical adenopathy.  Neurological: He is alert and oriented to person,  place, and time. He has normal reflexes. No cranial nerve deficit. Coordination normal.  Skin: Skin is warm and dry. No rash noted. He is not diaphoretic. No erythema. No pallor.  Mild suntan present on exposed areas  Psychiatric: He has a normal mood and affect. His behavior is normal. Judgment and thought content normal.  Nursing note and vitals reviewed.   Filed Vitals:   08/31/14 1019  BP: 124/64  Pulse: 70  Height: '5\' 10"'$  (1.778 m)  Weight: 220 lb (99.791 kg)  SpO2: 95%         Assessment & Plan:     ICD-9-CM ICD-10-CM   1. COPD, very severe 496 J44.9   2. Hypercapnic respiratory failure, chronic 518.83 J96.12     - Disease is stable. - You look stronger - Genetic test for COPD was normal  - Continue using DuoNeb as scheduled - Continue oxygen as scheduled - ANy breathing issues - always call us anytime [phone]  Followup 6 months with NP Tammy    Dr. Brand Males, M.D., [facility].C.P Pulmonary and Critical Care Medicine Staff Physician [place] Pager: [phone], If no answer or between  15:00h - 7:00h: call [phone]  08/31/2014 11:05 AM

## 2014-08-31 NOTE — Patient Instructions (Addendum)
ICD-9-CM ICD-10-CM   1. COPD, very severe 496 J44.9   2. Hypercapnic respiratory failure, chronic 518.83 J96.12      - Disease is stable. - You look stronger - Genetic test for COPD was normal  - Continue using DuoNeb as scheduled - Continue oxygen as scheduled - ANy breathing issues - always call us anytime [phone]  Followup 6 months with NP Tammy

## 2014-09-12 ENCOUNTER — Telehealth: Payer: Self-pay

## 2014-09-14 ENCOUNTER — Other Ambulatory Visit: Payer: Self-pay

## 2014-09-14 MED ORDER — CARVEDILOL 6.25 MG PO TABS: 6.2500 mg | ORAL_TABLET | Freq: Two times a day (BID) | ORAL | Status: DC

## 2014-09-14 NOTE — Telephone Encounter (Signed)
[facility]

## 2014-09-14 NOTE — Telephone Encounter (Signed)
Per Dr Irish Lack

## 2014-09-14 NOTE — Telephone Encounter (Signed)
OK to try Carvedilol 6.25 BID.  If BP readings are over 140/90, let us know, we may need to change dose.

## 2014-10-25 ENCOUNTER — Telehealth: Payer: Self-pay | Admitting: Internal Medicine

## 2014-10-25 MED ORDER — IPRATROPIUM-ALBUTEROL 0.5-2.5 (3) MG/3ML IN SOLN: 3.0000 mL | Freq: Three times a day (TID) | RESPIRATORY_TRACT | Status: DC

## 2014-10-25 NOTE — Telephone Encounter (Signed)
Called and spoke to pt's wife, [name]. Pt requesting a refill of Duoneb. Rx sent to preferred pharmacy. Pt verbalized understanding and denied any further questions or concerns at this time.

## 2014-10-27 ENCOUNTER — Encounter (HOSPITAL_COMMUNITY): Payer: Self-pay | Admitting: *Deleted

## 2014-10-27 ENCOUNTER — Emergency Department (HOSPITAL_COMMUNITY): Payer: Medicare Other

## 2014-10-27 ENCOUNTER — Emergency Department (HOSPITAL_COMMUNITY)
Admission: EM | Admit: 2014-10-27 | Discharge: 2014-10-27 | Disposition: A | Discharge status: Home / Self Care | Payer: Medicare Other | Attending: Emergency Medicine | Admitting: Emergency Medicine

## 2014-10-27 DIAGNOSIS — I483 Typical atrial flutter: Secondary | ICD-10-CM

## 2014-10-27 DIAGNOSIS — Z86018 Personal history of other benign neoplasm: Secondary | ICD-10-CM | POA: Diagnosis not present

## 2014-10-27 DIAGNOSIS — J441 Chronic obstructive pulmonary disease with (acute) exacerbation: Secondary | ICD-10-CM | POA: Insufficient documentation

## 2014-10-27 DIAGNOSIS — Z87891 Personal history of nicotine dependence: Secondary | ICD-10-CM | POA: Diagnosis not present

## 2014-10-27 DIAGNOSIS — Z8619 Personal history of other infectious and parasitic diseases: Secondary | ICD-10-CM | POA: Insufficient documentation

## 2014-10-27 DIAGNOSIS — Z951 Presence of aortocoronary bypass graft: Secondary | ICD-10-CM | POA: Insufficient documentation

## 2014-10-27 DIAGNOSIS — I10 Essential (primary) hypertension: Secondary | ICD-10-CM

## 2014-10-27 DIAGNOSIS — Z79899 Other long term (current) drug therapy: Secondary | ICD-10-CM | POA: Diagnosis not present

## 2014-10-27 DIAGNOSIS — M1990 Unspecified osteoarthritis, unspecified site: Secondary | ICD-10-CM | POA: Diagnosis not present

## 2014-10-27 DIAGNOSIS — Z880 Allergy status to penicillin: Secondary | ICD-10-CM | POA: Insufficient documentation

## 2014-10-27 DIAGNOSIS — H9190 Unspecified hearing loss, unspecified ear: Secondary | ICD-10-CM | POA: Diagnosis not present

## 2014-10-27 DIAGNOSIS — R002 Palpitations: Secondary | ICD-10-CM | POA: Diagnosis present

## 2014-10-27 DIAGNOSIS — I4892 Unspecified atrial flutter: Secondary | ICD-10-CM | POA: Diagnosis not present

## 2014-10-27 DIAGNOSIS — I2510 Atherosclerotic heart disease of native coronary artery without angina pectoris: Secondary | ICD-10-CM | POA: Diagnosis not present

## 2014-10-27 DIAGNOSIS — E119 Type 2 diabetes mellitus without complications: Secondary | ICD-10-CM | POA: Diagnosis not present

## 2014-10-27 DIAGNOSIS — Z85118 Personal history of other malignant neoplasm of bronchus and lung: Secondary | ICD-10-CM | POA: Insufficient documentation

## 2014-10-27 LAB — CBC
HCT: 39.7 % (ref 39.0–52.0)
Hemoglobin: 13.2 g/dL (ref 13.0–17.0)
MCH: 31 pg (ref 26.0–34.0)
MCHC: 33.2 g/dL (ref 30.0–36.0)
MCV: 93.2 fL (ref 78.0–100.0)
Platelets: 208 10*3/uL (ref 150–400)
RBC: 4.26 MIL/uL (ref 4.22–5.81)
RDW: 13.4 % (ref 11.5–15.5)
WBC: 11.4 10*3/uL — ABNORMAL HIGH (ref 4.0–10.5)

## 2014-10-27 LAB — BASIC METABOLIC PANEL
Anion gap: 12 (ref 5–15)
BUN: 18 mg/dL (ref 6–20)
CO2: 30 mmol/L (ref 22–32)
Calcium: 10.1 mg/dL (ref 8.9–10.3)
Chloride: 95 mmol/L — ABNORMAL LOW (ref 101–111)
Creatinine, Ser: 1.14 mg/dL (ref 0.61–1.24)
GFR calc Af Amer: >60 mL/min (ref >60)
GFR calc non Af Amer: 59 mL/min — ABNORMAL LOW (ref >60)
Glucose, Bld: 144 mg/dL — ABNORMAL HIGH (ref 65–99)
Potassium: 4 mmol/L (ref 3.5–5.1)
Sodium: 137 mmol/L (ref 135–145)

## 2014-10-27 LAB — I-STAT TROPONIN, ED: Troponin i, poc: 0.03 ng/mL (ref 0.00–0.08)

## 2014-10-27 LAB — TSH: TSH: 1.514 u[IU]/mL (ref 0.350–4.500)

## 2014-10-27 LAB — MAGNESIUM: Magnesium: 2.1 mg/dL (ref 1.7–2.4)

## 2014-10-27 MED ORDER — SODIUM CHLORIDE 0.9 % IV SOLN
Freq: Once | INTRAVENOUS | Status: AC
  Administered 2014-10-27: 11:00:00 via INTRAVENOUS

## 2014-10-27 MED ORDER — RIVAROXABAN 20 MG PO TABS: 20.0000 mg | ORAL_TABLET | Freq: Every day | ORAL | Status: DC

## 2014-10-27 MED ORDER — DILTIAZEM HCL 100 MG IV SOLR
5.0000 mg/h | INTRAVENOUS | Status: DC
  Administered 2014-10-27: 5 mg/h via INTRAVENOUS

## 2014-10-27 MED ORDER — DILTIAZEM LOAD VIA INFUSION
15.0000 mg | Freq: Once | INTRAVENOUS | Status: DC
  Filled 2014-10-27: qty 15

## 2014-10-27 MED ORDER — CARVEDILOL 12.5 MG PO TABS: 12.5000 mg | ORAL_TABLET | Freq: Two times a day (BID) | ORAL | Status: DC

## 2014-10-27 MED ORDER — DILTIAZEM HCL 25 MG/5ML IV SOLN
5.0000 mg | Freq: Once | INTRAVENOUS | Status: AC
  Administered 2014-10-27: 5 mg via INTRAVENOUS
  Filled 2014-10-27: qty 5

## 2014-10-27 NOTE — Consult Note (Signed)
Admit date: 10/27/2014 Referring Physician  Dr. Wilson Singer Primary Physician  [name], MD Primary Cardiologist  Dr. Irish Lack Reason for Consultation  Atrial flutter  HPI: 81 year old male with coronary artery disease, COPD, home oxygen, removal of intracardiac tumor, carotid artery disease with essential hypertension who came to the emergency room secondary to elevated blood pressure as well as elevated heart rate. Once the emergency room, he was discovered to be in atrial flutter with rapid ventricular response. IV diltiazem low-dose was utilized, 5 mg which promptly decreased his heart rate. He is currently in the 80s and asymptomatic.  Denies any chest pain, shortness of breath, fevers, chills, bleeding, diarrhea. His wife is on Coumadin. He currently takes Plavix, presumably for his prior carotid endarterectomy/peripheral vascular disease.  He is not currently on aspirin.  Plan was to change him from Bystolic over to carvedilol secondary to cost.    PMH:   Past Medical History  Diagnosis Date  . HTN (hypertension)   . History of lung cancer   . Hypertriglyceridemia   . CAD (coronary artery disease)   . Atrial myxoma     Left  . Carotid artery disease   . Hearing loss   . Diabetes mellitus   . Arthritis   . COPD (chronic obstructive pulmonary disease)   . Cancer     PSH:   Past Surgical History  Procedure Laterality Date  . Cabg x 1 with reverse saphenous vein graft to distal rt coronary artery and excision of left atrial myxoma  07/12/2005    Dr Servando Snare  . Left pneumonectomy  05/03/1998    Dr Servando Snare  . Combined mediastinoscopy and bronchoscopy  05/01/1998    Dr Servando Snare  . Rt  carotid endarterectomy      Dr Kellie Simmering  . Coronary artery bypass graft    . Carotid endarterectomy  2007    Right CEA  . Lung removal, partial  2000    Left lung removed    Allergies:  Statins; Sulfa antibiotics; and Amoxicillin Prior to Admit Meds:   Prior to Admission medications     Medication Sig Start Date End Date Taking? Authorizing Provider  acetaminophen (TYLENOL) 325 MG tablet Take 650 mg by mouth every 6 (six) hours as needed. For pain    Historical Provider, MD  acetaZOLAMIDE (DIAMOX) 250 MG tablet Take 1 tablet (250 mg total) by mouth 2 (two) times daily. 09/06/13   Kinnie Feil, MD  albuterol (PROVENTIL HFA;VENTOLIN HFA) 108 (90 BASE) MCG/ACT inhaler Inhale 2 puffs into the lungs every 6 (six) hours as needed for wheezing.    Historical Provider, MD  carvedilol (COREG) 6.25 MG tablet Take 1 tablet (6.25 mg total) by mouth 2 (two) times daily. 09/14/14   Jettie Booze, MD  cetirizine (ZYRTEC) 10 MG tablet Take 10 mg by mouth daily as needed. For allergies    Historical Provider, MD  cholecalciferol (VITAMIN D) 1000 UNITS tablet Take 2,000 Units by mouth 2 (two) times daily.     Historical Provider, MD  clopidogrel (PLAVIX) 75 MG tablet Take 1 tablet (75 mg total) by mouth daily. 01/10/14   Jettie Booze, MD  EPIPEN 2-PAK 0.3 MG/0.3ML DEVI Inject 0.3 mg into the skin See admin instructions. Take as needed for severe allergic reaction 11/04/11   Historical Provider, MD  felodipine (PLENDIL) 10 MG 24 hr tablet Take 1 tablet (10 mg total) by mouth daily. 01/10/14   Jettie Booze, MD  ipratropium-albuterol (DUONEB) 0.5-2.5 (3)  MG/3ML SOLN Take 3 mLs by nebulization 3 (three) times daily. 10/25/14   Brand Males, MD  losartan-hydrochlorothiazide (HYZAAR) 100-25 MG per tablet Take 1 tablet by mouth daily. 01/10/14   Jettie Booze, MD  magnesium oxide (MAG-OX) 400 MG tablet Take 400 mg by mouth daily.    Historical Provider, MD  metFORMIN (GLUCOPHAGE) 500 MG tablet Take 500 mg by mouth daily with breakfast.  03/24/14   Historical Provider, MD  Multiple Vitamins-Minerals (MULTIVITAMINS THER. W/MINERALS) TABS Take 1 tablet by mouth daily.    Historical Provider, MD  nebivolol (BYSTOLIC) 10 MG tablet Take 1 tablet (10 mg total) by mouth daily. 01/10/14    Jettie Booze, MD  Omega-3 Fatty Acids (FISH OIL) 1200 MG CAPS Take 1 capsule by mouth 2 (two) times daily.    Historical Provider, MD  ranitidine (ZANTAC) 150 MG capsule Take 150 mg by mouth at bedtime as needed.    Historical Provider, MD   Fam HX:    Family History  Problem Relation Age of Onset  . Heart disease Mother     NOT before age 9  . Heart attack Mother   . Heart disease Sister   . Cancer Sister     Breast  . Stomach cancer Father   . Cancer Father     Stomach   Social HX:    History   Social History  . Marital Status: Married    Spouse Name: N/A  . Number of Children: N/A  . Years of Education: N/A   Occupational History  . retired    Social History Main Topics  . Smoking status: Former Smoker    Types: Cigarettes    Quit date: 07/21/1997  . Smokeless tobacco: Former Systems developer  . Alcohol Use: No  . Drug Use: No  . Sexual Activity: Not Currently   Other Topics Concern  . Not on file   Social History Narrative     ROS:  All 11 ROS were addressed and are negative except what is stated in the HPI   Physical Exam: Blood pressure 113/68, pulse 95, temperature 97.9 F (36.6 C), temperature source Oral, resp. rate 19, height '5\' 10"'$  (1.778 m), weight 222 lb (100.699 kg), SpO2 99 %.   General: Well developed, well nourished, in no acute distress Head: Eyes PERRLA, No xanthomas.   Normal cephalic and atramatic  Lungs:   Clear bilaterally to auscultation and percussion. Normal respiratory effort. No wheezes, no rales. Heart:   HRRR S1 S2 mildly irregular Pulses are 2+ & equal. No murmur, rubs, gallops.  No carotid bruit. No JVD.  No abdominal bruits. Prior chest wall scar noted Abdomen: Bowel sounds are positive, abdomen soft and non-tender without masses. No hepatosplenomegaly. Protuberant abdomen Msk:  Back normal. Normal strength and tone for age. Extremities:  No clubbing, cyanosis, trace edema.  DP +1 Neuro: Alert and oriented X 3, non-focal, MAE x  4 GU: Deferred Rectal: Deferred Psych:  Good affect, responds appropriately      Labs: Lab Results  Component Value Date   WBC 11.4* 10/27/2014   HGB 13.2 10/27/2014   HCT 39.7 10/27/2014   MCV 93.2 10/27/2014   PLT 208 10/27/2014     Recent Labs Lab 10/27/14 1038  NA 137  K 4.0  CL 95*  CO2 30  BUN 18  CREATININE 1.14  CALCIUM 10.1  GLUCOSE 144*   No results for input(s): CKTOTAL, CKMB, TROPONINI in the last 72 hours. Lab Results  Component  Value Date   CHOL 238* 05/18/2011   HDL 36* 05/18/2011   LDLCALC 144* 05/18/2011   TRIG 291* 05/18/2011   Lab Results  Component Value Date   DDIMER * 01/19/2010    2.70        AT THE INHOUSE ESTABLISHED CUTOFF VALUE OF 0.48 ug/mL FEU, THIS ASSAY HAS BEEN DOCUMENTED IN THE LITERATURE TO HAVE A SENSITIVITY AND NEGATIVE PREDICTIVE VALUE OF AT LEAST 98 TO 99%.  THE TEST RESULT SHOULD BE CORRELATED WITH AN ASSESSMENT OF THE CLINICAL PROBABILITY OF DVT / VTE.     Radiology:  Dg Chest Port 1 View  10/27/2014   CLINICAL DATA:  Palpitations.  Left pneumonectomy.  EXAM: PORTABLE CHEST - 1 VIEW  COMPARISON:  05/19/2014  FINDINGS: Postop CABG. Left pneumonectomy with extensive pleural calcification on the left which is stable  Right lung is well aerated and clear. Negative for pneumonia or heart failure. No right pleural effusion.  IMPRESSION: Postop left pneumonectomy.  No acute abnormality.   Electronically Signed   By: Franchot Gallo M.D.   On: 10/27/2014 10:59   Personally viewed.  EKG:  Atrial flutter with variable response, no ST segment changes Personally viewed.   ASSESSMENT/PLAN:    81 year old male with history of coronary disease, carotid arterectomy, obesity, history of prior atrial fibrillation/flutter, here with return of atrial flutter currently well controlled.  1. Atrial flutter  -  Anticoagulation - CHADS-VASc (at least 5).   - We have discussed the bleeding risks. We will start Xarelto '20mg'$  once a day.   -  We will stop clopidogrel  - There is a possibility that he may auto convert. Most recent office visit 01/10/14 demonstrated normal sinus rhythm.  - We will go ahead and place him on carvedilol 12.5 g twice a day (had planned on 6.25 but because of his recent rapid ventricular response, and elevated blood pressure, I think he may be able to tolerate 12.5 well and this will help with rate control).  2. Coronary artery disease  - No angina  3. History of myxoma  - Status post resection.  4. Essential hypertension  - Currently controlled. Medications reviewed.  5. Hyperlipidemia  - Medications reviewed.  We will have follow-up in clinic soon. I have sent an message to the scheduling division.  Candee Furbish, MD  10/27/2014  1:32 PM

## 2014-10-27 NOTE — Discharge Instructions (Signed)
STOP Plavix. Start Xarelto (blood thinner). Also increase coreg to 12.'5mg'$  twice a day.   Atrial Flutter Atrial flutter is a heart rhythm that can cause the heart to beat very fast (tachycardia). It originates in the upper chambers of the heart (atria). In atrial flutter, the top chambers of the heart (atria) often beat much faster than the bottom chambers of the heart (ventricles). Atrial flutter has a regular "saw toothed" appearance in an EKG readout. An EKG is a test that records the electrical activity of the heart. Atrial flutter can cause the heart to beat up to 150 beats per minute (BPM). Atrial flutter can either be short lived (paroxysmal) or permanent.  CAUSES  Causes of atrial flutter can be many. Some of these include:  Heart related issues:  Heart attack (myocardial infarction).  Heart failure.  Heart valve problems.  Poorly controlled high blood pressure (hypertension).  After open heart surgery.  Lung related issues:  A blood clot in the lungs (pulmonary embolism).  Chronic obstructive pulmonary disease (COPD). Medications used to treat COPD can attribute to atrial flutter.  Other related causes:  Hyperthyroidism.  Caffeine.  Some decongestant cold medications.  Low electrolyte levels such as potassium or magnesium.  Cocaine. SYMPTOMS  An awareness of your heart beating rapidly (palpitations).  Shortness of breath.  Chest pain.  Low blood pressure (hypotension).  Dizziness or fainting. DIAGNOSIS  Different tests can be performed to diagnose atrial flutter.   An EKG.  Holter monitor. This is a 24-hour recording of your heart rhythm. You will also be given a diary. Write down all symptoms that you have and what you were doing at the time you experienced symptoms.  Cardiac event monitor. This small device can be worn for up to 30 days. When you have heart symptoms, you will push a button on the device. This will then record your heart  rhythm.  Echocardiogram. This is an imaging test to look at your heart. Your caregiver will look at your heart valves and the ventricles.  Stress test. This test can help determine if the atrial flutter is related to exercise or if coronary artery disease is present.  Laboratory studies will look at certain blood levels like:  Complete blood count (CBC).  Potassium.  Magnesium.  Thyroid function. TREATMENT  Treatment of atrial flutter varies. A combination of therapies may be used or sometimes atrial flutter may need only 1 type of treatment.  Lab work: If your blood work, such as your electrolytes (potassium, magnesium) or your thyroid function tests, are abnormal, your caregiver will treat them accordingly.  Medication:  There are several different types of medications that can convert your heart to a normal rhythm and prevent atrial flutter from reoccurring.  Nonsurgical procedures: Nonsurgical techniques may be used to control atrial flutter. Some examples include:  Cardioversion. This technique uses either drugs or an electrical shock to restore a normal heart rhythm:  Cardioversion drugs may be given through an intravenous (IV) line to help "reset" the heart rhythm.  In electrical cardioversion, your caregiver shocks your heart with electrical energy. This helps to reset the heartbeat to a normal rhythm.  Ablation. If atrial flutter is a persistent problem, an ablation may be needed. This procedure is done under mild sedation. High frequency radio-wave energy is used to destroy the area of heart tissue responsible for atrial flutter. SEEK IMMEDIATE MEDICAL CARE IF:  You have:  Dizziness.  Near fainting or fainting.  Shortness of breath.  Chest pain  or pressure.  Sudden nausea or vomiting.  Profuse sweating. If you have the above symptoms, call your local emergency service immediately! Do not drive yourself to the hospital. MAKE SURE YOU:   Understand these  instructions.  Will watch your condition.  Will get help right away if you are not doing well or get worse. Document Released: 08/25/2008 Document Revised: 08/23/2013 Document Reviewed: 08/25/2008 [facility] Patient Information 2015 [place], [place]. This information is not intended to replace advice given to you by your health care provider. Make sure you discuss any questions you have with your health care provider.

## 2014-10-27 NOTE — ED Notes (Signed)
Pt states acute onset palpitations this am.  HR 120's, a-flutter.  Denies chest pain, sob, etc.

## 2014-10-27 NOTE — ED Provider Notes (Signed)
CSN: [id number]     Arrival date & time 10/27/14  1009 History   First MD Initiated Contact with Patient 10/27/14 1013     Chief Complaint  Patient presents with  . Palpitations     (Consider location/radiation/quality/duration/timing/severity/associated sxs/prior Treatment) HPI   81 year old man with palpitations. In atrial flutter with variable conduction.  Past history of single-vessel CABG, left atrial myxoma excision. From what I can glean from records and wife, it sounds like shortly after these procedures he developed atrial flutter with 2:1 block resistant to IV amiodarone and IV Cardizem and then had elective cardioversion 08/16/2005. It appears he has been in sinus rhythm since? Reports compliance with meds. Denies significant ETOH. Does not appear to have hx of thyroid disease.   Past Medical History  Diagnosis Date  . HTN (hypertension)   . History of lung cancer   . Hypertriglyceridemia   . CAD (coronary artery disease)   . Atrial myxoma     Left  . Carotid artery disease   . Hearing loss   . Diabetes mellitus   . Arthritis   . COPD (chronic obstructive pulmonary disease)   . Cancer    Past Surgical History  Procedure Laterality Date  . Cabg x 1 with reverse saphenous vein graft to distal rt coronary artery and excision of left atrial myxoma  07/12/2005    Dr Servando Snare  . Left pneumonectomy  05/03/1998    Dr Servando Snare  . Combined mediastinoscopy and bronchoscopy  05/01/1998    Dr Servando Snare  . Rt  carotid endarterectomy      Dr Kellie Simmering  . Coronary artery bypass graft    . Carotid endarterectomy  2007    Right CEA  . Lung removal, partial  2000    Left lung removed    Family History  Problem Relation Age of Onset  . Heart disease Mother     NOT before age 68  . Heart attack Mother   . Heart disease Sister   . Cancer Sister     Breast  . Stomach cancer Father   . Cancer Father     Stomach   History  Substance Use Topics  . Smoking status: Former Smoker   Types: Cigarettes    Quit date: 07/21/1997  . Smokeless tobacco: Former Systems developer  . Alcohol Use: No    Review of Systems    Allergies  Statins; Sulfa antibiotics; and Amoxicillin  Home Medications   Prior to Admission medications   Medication Sig Start Date End Date Taking? Authorizing Provider  acetaminophen (TYLENOL) 325 MG tablet Take 650 mg by mouth every 6 (six) hours as needed. For pain    Historical Provider, MD  acetaZOLAMIDE (DIAMOX) 250 MG tablet Take 1 tablet (250 mg total) by mouth 2 (two) times daily. 09/06/13   Kinnie Feil, MD  albuterol (PROVENTIL HFA;VENTOLIN HFA) 108 (90 BASE) MCG/ACT inhaler Inhale 2 puffs into the lungs every 6 (six) hours as needed for wheezing.    Historical Provider, MD  carvedilol (COREG) 6.25 MG tablet Take 1 tablet (6.25 mg total) by mouth 2 (two) times daily. 09/14/14   Jettie Booze, MD  cetirizine (ZYRTEC) 10 MG tablet Take 10 mg by mouth daily as needed. For allergies    Historical Provider, MD  cholecalciferol (VITAMIN D) 1000 UNITS tablet Take 2,000 Units by mouth 2 (two) times daily.     Historical Provider, MD  clopidogrel (PLAVIX) 75 MG tablet Take 1 tablet (75 mg total) by  mouth daily. 01/10/14   Jettie Booze, MD  EPIPEN 2-PAK 0.3 MG/0.3ML DEVI Inject 0.3 mg into the skin See admin instructions. Take as needed for severe allergic reaction 11/04/11   Historical Provider, MD  felodipine (PLENDIL) 10 MG 24 hr tablet Take 1 tablet (10 mg total) by mouth daily. 01/10/14   Jettie Booze, MD  ipratropium-albuterol (DUONEB) 0.5-2.5 (3) MG/3ML SOLN Take 3 mLs by nebulization 3 (three) times daily. 10/25/14   Brand Males, MD  losartan-hydrochlorothiazide (HYZAAR) 100-25 MG per tablet Take 1 tablet by mouth daily. 01/10/14   Jettie Booze, MD  magnesium oxide (MAG-OX) 400 MG tablet Take 400 mg by mouth daily.    Historical Provider, MD  metFORMIN (GLUCOPHAGE) 500 MG tablet Take 500 mg by mouth daily with breakfast.  03/24/14    Historical Provider, MD  Multiple Vitamins-Minerals (MULTIVITAMINS THER. W/MINERALS) TABS Take 1 tablet by mouth daily.    Historical Provider, MD  nebivolol (BYSTOLIC) 10 MG tablet Take 1 tablet (10 mg total) by mouth daily. 01/10/14   Jettie Booze, MD  Omega-3 Fatty Acids (FISH OIL) 1200 MG CAPS Take 1 capsule by mouth 2 (two) times daily.    Historical Provider, MD  ranitidine (ZANTAC) 150 MG capsule Take 150 mg by mouth at bedtime as needed.    Historical Provider, MD   BP 130/62 mmHg  Pulse 96  Temp(Src) 97.9 F (36.6 C) (Oral)  Resp 14  Ht '5\' 10"'$  (1.778 m)  Wt 222 lb (100.699 kg)  BMI 31.85 kg/m2  SpO2 97% Physical Exam  Constitutional: He appears well-developed and well-nourished. No distress.  HENT:  Head: Normocephalic and atraumatic.  Eyes: Conjunctivae are normal. Right eye exhibits no discharge. Left eye exhibits no discharge.  Neck: Neck supple.  Cardiovascular: Normal heart sounds.  Exam reveals no gallop and no friction rub.   No murmur heard. Tachy. irreg irreg.   Pulmonary/Chest: Effort normal. No respiratory distress. He has wheezes.  Abdominal: Soft. He exhibits no distension. There is no tenderness.  Musculoskeletal: He exhibits no edema or tenderness.  Neurological: He is alert.  Skin: Skin is warm and dry.  Psychiatric: He has a normal mood and affect. His behavior is normal. Thought content normal.  Nursing note and vitals reviewed.   ED Course  Procedures (including critical care time) Labs Review Labs Reviewed  CBC - Abnormal; Notable for the following:    WBC 11.4 (*)    All other components within normal limits  BASIC METABOLIC PANEL - Abnormal; Notable for the following:    Chloride 95 (*)    Glucose, Bld 144 (*)    GFR calc non Af Amer 59 (*)    All other components within normal limits  MAGNESIUM  TSH  I-STAT TROPOININ, ED    Imaging Review Dg Chest Port 1 View  10/27/2014   CLINICAL DATA:  Palpitations.  Left pneumonectomy.   EXAM: PORTABLE CHEST - 1 VIEW  COMPARISON:  05/19/2014  FINDINGS: Postop CABG. Left pneumonectomy with extensive pleural calcification on the left which is stable  Right lung is well aerated and clear. Negative for pneumonia or heart failure. No right pleural effusion.  IMPRESSION: Postop left pneumonectomy.  No acute abnormality.   Electronically Signed   By: Franchot Gallo M.D.   On: 10/27/2014 10:59     EKG Interpretation   Date/Time:  Thursday October 27 2014 10:15:41 EDT Ventricular Rate:  121 PR Interval:    QRS Duration: 70 QT Interval:  318 QTC Calculation: 451 R Axis:   79 Text Interpretation:  Atrial flutter with variable A-V block ST elevation,  consider early repolarization, pericarditis, or injury Nonspecific ST  abnormality Abnormal ECG Confirmed by Kyri Shader  MD, Yaeli Hartung ([id number]) on  10/27/2014 10:44:47 AM      MDM   Final diagnoses:  Atrial flutter, unspecified     Additional past history of HTN, DM, hypertriglyceridemia. On plavix. Currently on bystolic, but sounds like going to change to coreg for insurance reasons.   Today, started on cardizem and rate now 90-100. ED w/u has been pretty unremarkable. Will discuss with cards re: med/anticoagulant recommendations. Primary cardiologist: Irish Lack.    Virgel Manifold, MD 11/04/14 1335

## 2014-10-28 ENCOUNTER — Telehealth: Payer: Self-pay | Admitting: Interventional Cardiology

## 2014-10-28 NOTE — Telephone Encounter (Signed)
New message      Pt was seen in ER yesterday.  He was prescribed xarelto.  Ins will not pay.  Pt needs prior approval.  Please call [facility] to get info.  Wife said they were faxing prior approval

## 2014-10-31 ENCOUNTER — Telehealth: Payer: Self-pay

## 2014-10-31 NOTE — Telephone Encounter (Signed)
Prior auth  Received for Xarelto through [facility]. Will notify patient's pharmacy.

## 2014-11-01 ENCOUNTER — Other Ambulatory Visit: Payer: Self-pay

## 2014-11-01 ENCOUNTER — Telehealth: Payer: Self-pay | Admitting: Interventional Cardiology

## 2014-11-01 MED ORDER — RIVAROXABAN 20 MG PO TABS: 20.0000 mg | ORAL_TABLET | Freq: Every day | ORAL | Status: DC

## 2014-11-01 NOTE — Telephone Encounter (Signed)
Done. Wife informed.

## 2014-11-01 NOTE — Telephone Encounter (Signed)
New message      [facility]  Prior approval has been approved but a presc was not sent.  Please call wife to let her know presc has been faxed

## 2014-11-03 ENCOUNTER — Inpatient Hospital Stay (HOSPITAL_COMMUNITY)
Admission: EM | Admit: 2014-11-03 | Discharge: 2014-11-04 | DRG: 310 | Disposition: A | Discharge status: Home / Self Care | Payer: Medicare Other | Attending: Cardiology | Admitting: Cardiology

## 2014-11-03 ENCOUNTER — Encounter (HOSPITAL_COMMUNITY): Payer: Self-pay | Admitting: *Deleted

## 2014-11-03 DIAGNOSIS — Z8249 Family history of ischemic heart disease and other diseases of the circulatory system: Secondary | ICD-10-CM

## 2014-11-03 DIAGNOSIS — Z880 Allergy status to penicillin: Secondary | ICD-10-CM

## 2014-11-03 DIAGNOSIS — Z9981 Dependence on supplemental oxygen: Secondary | ICD-10-CM

## 2014-11-03 DIAGNOSIS — I129 Hypertensive chronic kidney disease with stage 1 through stage 4 chronic kidney disease, or unspecified chronic kidney disease: Secondary | ICD-10-CM | POA: Diagnosis present

## 2014-11-03 DIAGNOSIS — E785 Hyperlipidemia, unspecified: Secondary | ICD-10-CM | POA: Diagnosis present

## 2014-11-03 DIAGNOSIS — Z888 Allergy status to other drugs, medicaments and biological substances status: Secondary | ICD-10-CM | POA: Diagnosis not present

## 2014-11-03 DIAGNOSIS — Z882 Allergy status to sulfonamides status: Secondary | ICD-10-CM

## 2014-11-03 DIAGNOSIS — E1122 Type 2 diabetes mellitus with diabetic chronic kidney disease: Secondary | ICD-10-CM | POA: Diagnosis present

## 2014-11-03 DIAGNOSIS — Z7901 Long term (current) use of anticoagulants: Secondary | ICD-10-CM | POA: Diagnosis not present

## 2014-11-03 DIAGNOSIS — M1990 Unspecified osteoarthritis, unspecified site: Secondary | ICD-10-CM | POA: Diagnosis present

## 2014-11-03 DIAGNOSIS — Z951 Presence of aortocoronary bypass graft: Secondary | ICD-10-CM | POA: Diagnosis not present

## 2014-11-03 DIAGNOSIS — E781 Pure hyperglyceridemia: Secondary | ICD-10-CM | POA: Diagnosis present

## 2014-11-03 DIAGNOSIS — Z85118 Personal history of other malignant neoplasm of bronchus and lung: Secondary | ICD-10-CM

## 2014-11-03 DIAGNOSIS — H9190 Unspecified hearing loss, unspecified ear: Secondary | ICD-10-CM | POA: Diagnosis present

## 2014-11-03 DIAGNOSIS — I2510 Atherosclerotic heart disease of native coronary artery without angina pectoris: Secondary | ICD-10-CM | POA: Diagnosis present

## 2014-11-03 DIAGNOSIS — J449 Chronic obstructive pulmonary disease, unspecified: Secondary | ICD-10-CM | POA: Diagnosis present

## 2014-11-03 DIAGNOSIS — I6523 Occlusion and stenosis of bilateral carotid arteries: Secondary | ICD-10-CM | POA: Diagnosis present

## 2014-11-03 DIAGNOSIS — R079 Chest pain, unspecified: Secondary | ICD-10-CM | POA: Diagnosis present

## 2014-11-03 DIAGNOSIS — Z79899 Other long term (current) drug therapy: Secondary | ICD-10-CM

## 2014-11-03 DIAGNOSIS — I483 Typical atrial flutter: Secondary | ICD-10-CM | POA: Diagnosis present

## 2014-11-03 DIAGNOSIS — N183 Chronic kidney disease, stage 3 (moderate): Secondary | ICD-10-CM | POA: Diagnosis present

## 2014-11-03 DIAGNOSIS — D151 Benign neoplasm of heart: Secondary | ICD-10-CM | POA: Diagnosis present

## 2014-11-03 DIAGNOSIS — I4891 Unspecified atrial fibrillation: Secondary | ICD-10-CM

## 2014-11-03 DIAGNOSIS — E119 Type 2 diabetes mellitus without complications: Secondary | ICD-10-CM

## 2014-11-03 DIAGNOSIS — I4892 Unspecified atrial flutter: Principal | ICD-10-CM

## 2014-11-03 DIAGNOSIS — I10 Essential (primary) hypertension: Secondary | ICD-10-CM | POA: Diagnosis present

## 2014-11-03 DIAGNOSIS — Z87891 Personal history of nicotine dependence: Secondary | ICD-10-CM

## 2014-11-03 DIAGNOSIS — Z902 Acquired absence of lung [part of]: Secondary | ICD-10-CM | POA: Diagnosis present

## 2014-11-03 HISTORY — DX: Depression, unspecified: F32.A

## 2014-11-03 HISTORY — DX: Headache, unspecified: R51.9

## 2014-11-03 HISTORY — DX: Type 2 diabetes mellitus without complications: E11.9

## 2014-11-03 HISTORY — DX: Headache: R51

## 2014-11-03 HISTORY — DX: Gastro-esophageal reflux disease without esophagitis: K21.9

## 2014-11-03 HISTORY — DX: Major depressive disorder, single episode, unspecified: F32.9

## 2014-11-03 HISTORY — DX: Dependence on supplemental oxygen: Z99.81

## 2014-11-03 HISTORY — DX: Scarlet fever, uncomplicated: A38.9

## 2014-11-03 HISTORY — DX: Unspecified atrial flutter: I48.92

## 2014-11-03 LAB — CBC
HCT: 37.3 % — ABNORMAL LOW (ref 39.0–52.0)
Hemoglobin: 12.5 g/dL — ABNORMAL LOW (ref 13.0–17.0)
MCH: 31.6 pg (ref 26.0–34.0)
MCHC: 33.5 g/dL (ref 30.0–36.0)
MCV: 94.2 fL (ref 78.0–100.0)
Platelets: 212 10*3/uL (ref 150–400)
RBC: 3.96 MIL/uL — ABNORMAL LOW (ref 4.22–5.81)
RDW: 13.5 % (ref 11.5–15.5)
WBC: 10 10*3/uL (ref 4.0–10.5)

## 2014-11-03 LAB — TROPONIN I: Troponin I: <0.03 ng/mL (ref <0.031)

## 2014-11-03 LAB — BASIC METABOLIC PANEL
Anion gap: 10 (ref 5–15)
BUN: 24 mg/dL — ABNORMAL HIGH (ref 6–20)
CO2: 31 mmol/L (ref 22–32)
Calcium: 9.6 mg/dL (ref 8.9–10.3)
Chloride: 97 mmol/L — ABNORMAL LOW (ref 101–111)
Creatinine, Ser: 1.45 mg/dL — ABNORMAL HIGH (ref 0.61–1.24)
GFR calc Af Amer: 51 mL/min — ABNORMAL LOW (ref >60)
GFR calc non Af Amer: 44 mL/min — ABNORMAL LOW (ref >60)
Glucose, Bld: 170 mg/dL — ABNORMAL HIGH (ref 65–99)
Potassium: 3.8 mmol/L (ref 3.5–5.1)
Sodium: 138 mmol/L (ref 135–145)

## 2014-11-03 LAB — T4, FREE: Free T4: 0.86 ng/dL (ref 0.61–1.12)

## 2014-11-03 LAB — I-STAT TROPONIN, ED: Troponin i, poc: 0.01 ng/mL (ref 0.00–0.08)

## 2014-11-03 LAB — MAGNESIUM
Magnesium: 2.1 mg/dL (ref 1.7–2.4)
Magnesium: 2.2 mg/dL (ref 1.7–2.4)

## 2014-11-03 LAB — TSH: TSH: 1.609 u[IU]/mL (ref 0.350–4.500)

## 2014-11-03 MED ORDER — THERA M PLUS PO TABS
1.0000 | ORAL_TABLET | Freq: Every day | ORAL | Status: DC
Start: 2014-11-03 — End: 2014-11-03

## 2014-11-03 MED ORDER — ONDANSETRON HCL 4 MG/2ML IJ SOLN: 4.0000 mg | Freq: Four times a day (QID) | INTRAMUSCULAR | Status: DC | PRN

## 2014-11-03 MED ORDER — MAGNESIUM OXIDE 400 MG PO TABS: 400.0000 mg | ORAL_TABLET | Freq: Every day | ORAL | Status: DC

## 2014-11-03 MED ORDER — IPRATROPIUM-ALBUTEROL 0.5-2.5 (3) MG/3ML IN SOLN
3.0000 mL | Freq: Three times a day (TID) | RESPIRATORY_TRACT | Status: DC
  Administered 2014-11-03 – 2014-11-04 (×2): 3 mL via RESPIRATORY_TRACT
  Filled 2014-11-03 (×2): qty 3

## 2014-11-03 MED ORDER — ALBUTEROL SULFATE HFA 108 (90 BASE) MCG/ACT IN AERS: 2.0000 | INHALATION_SPRAY | Freq: Four times a day (QID) | RESPIRATORY_TRACT | Status: DC | PRN

## 2014-11-03 MED ORDER — OMEGA-3-ACID ETHYL ESTERS 1 G PO CAPS
1.0000 g | ORAL_CAPSULE | Freq: Every day | ORAL | Status: DC
  Administered 2014-11-04: 1 g via ORAL
  Filled 2014-11-03: qty 1

## 2014-11-03 MED ORDER — ALPRAZOLAM 0.25 MG PO TABS: 0.2500 mg | ORAL_TABLET | Freq: Two times a day (BID) | ORAL | Status: DC | PRN

## 2014-11-03 MED ORDER — LOSARTAN POTASSIUM-HCTZ 100-25 MG PO TABS: 1.0000 | ORAL_TABLET | Freq: Every day | ORAL | Status: DC

## 2014-11-03 MED ORDER — LOSARTAN POTASSIUM 50 MG PO TABS
100.0000 mg | ORAL_TABLET | Freq: Every day | ORAL | Status: DC
  Administered 2014-11-04: 100 mg via ORAL
  Filled 2014-11-03: qty 2

## 2014-11-03 MED ORDER — LORATADINE 10 MG PO TABS: 10.0000 mg | ORAL_TABLET | Freq: Every day | ORAL | Status: DC | PRN

## 2014-11-03 MED ORDER — CETYLPYRIDINIUM CHLORIDE 0.05 % MT LIQD
7.0000 mL | Freq: Two times a day (BID) | OROMUCOSAL | Status: DC
Start: 2014-11-03 — End: 2014-11-04
  Administered 2014-11-04: 7 mL via OROMUCOSAL

## 2014-11-03 MED ORDER — DILTIAZEM HCL ER COATED BEADS 180 MG PO CP24
180.0000 mg | ORAL_CAPSULE | Freq: Every day | ORAL | Status: DC
  Administered 2014-11-03 – 2014-11-04 (×2): 180 mg via ORAL
  Filled 2014-11-03 (×2): qty 1

## 2014-11-03 MED ORDER — ACETAMINOPHEN 325 MG PO TABS
650.0000 mg | ORAL_TABLET | ORAL | Status: DC | PRN
  Administered 2014-11-03 – 2014-11-04 (×2): 650 mg via ORAL
  Filled 2014-11-03 (×2): qty 2

## 2014-11-03 MED ORDER — FAMOTIDINE 20 MG PO TABS
20.0000 mg | ORAL_TABLET | Freq: Every day | ORAL | Status: DC
Start: 2014-11-03 — End: 2014-11-04
  Administered 2014-11-04: 20 mg via ORAL
  Filled 2014-11-03 (×2): qty 1

## 2014-11-03 MED ORDER — MAGNESIUM OXIDE 400 (241.3 MG) MG PO TABS
400.0000 mg | ORAL_TABLET | Freq: Every day | ORAL | Status: DC
  Administered 2014-11-03: 400 mg via ORAL
  Filled 2014-11-03: qty 1

## 2014-11-03 MED ORDER — ACETAMINOPHEN 325 MG PO TABS: 325.0000 mg | ORAL_TABLET | Freq: Four times a day (QID) | ORAL | Status: DC | PRN

## 2014-11-03 MED ORDER — CARVEDILOL 12.5 MG PO TABS
12.5000 mg | ORAL_TABLET | Freq: Two times a day (BID) | ORAL | Status: DC
  Administered 2014-11-03 – 2014-11-04 (×2): 12.5 mg via ORAL
  Filled 2014-11-03 (×2): qty 1

## 2014-11-03 MED ORDER — RIVAROXABAN 20 MG PO TABS
20.0000 mg | ORAL_TABLET | Freq: Every day | ORAL | Status: DC
  Administered 2014-11-03: 20 mg via ORAL
  Filled 2014-11-03: qty 1

## 2014-11-03 MED ORDER — VITAMIN D 1000 UNITS PO TABS
2000.0000 [IU] | ORAL_TABLET | Freq: Two times a day (BID) | ORAL | Status: DC
  Administered 2014-11-03 – 2014-11-04 (×2): 2000 [IU] via ORAL
  Filled 2014-11-03 (×2): qty 2

## 2014-11-03 MED ORDER — DILTIAZEM LOAD VIA INFUSION
10.0000 mg | Freq: Once | INTRAVENOUS | Status: AC
  Administered 2014-11-03: 10 mg via INTRAVENOUS
  Filled 2014-11-03: qty 10

## 2014-11-03 MED ORDER — DILTIAZEM HCL 100 MG IV SOLR
5.0000 mg/h | INTRAVENOUS | Status: DC
  Administered 2014-11-03: 5 mg/h via INTRAVENOUS

## 2014-11-03 MED ORDER — ALBUTEROL SULFATE (2.5 MG/3ML) 0.083% IN NEBU: 2.5000 mg | INHALATION_SOLUTION | Freq: Four times a day (QID) | RESPIRATORY_TRACT | Status: DC | PRN

## 2014-11-03 MED ORDER — ADULT MULTIVITAMIN W/MINERALS CH
1.0000 | ORAL_TABLET | Freq: Every day | ORAL | Status: DC
  Administered 2014-11-04: 1 via ORAL
  Filled 2014-11-03: qty 1

## 2014-11-03 MED ORDER — HYDROCHLOROTHIAZIDE 25 MG PO TABS
25.0000 mg | ORAL_TABLET | Freq: Every day | ORAL | Status: DC
  Administered 2014-11-04: 25 mg via ORAL
  Filled 2014-11-03: qty 1

## 2014-11-03 MED ORDER — SODIUM CHLORIDE 0.9 % IV SOLN
INTRAVENOUS | Status: DC
  Administered 2014-11-03: 18:00:00 via INTRAVENOUS

## 2014-11-03 MED ORDER — METFORMIN HCL 500 MG PO TABS: 500.0000 mg | ORAL_TABLET | Freq: Every day | ORAL | Status: DC

## 2014-11-03 NOTE — Discharge Instructions (Addendum)
Information on my medicine - XARELTO (Rivaroxaban)  This medication education was reviewed with me or my healthcare representative as part of my discharge preparation.  The pharmacist that spoke with me during my hospital stay was:  Dareen Piano, [facility]  Why was Xarelto prescribed for you? Xarelto was prescribed for you to reduce the risk of a blood clot forming that can cause a stroke if you have a medical condition called atrial fibrillation (a type of irregular heartbeat).  What do you need to know about xarelto ? Take your Xarelto ONCE DAILY at the same time every day with your evening meal. If you have difficulty swallowing the tablet whole, you may crush it and mix in applesauce just prior to taking your dose.  Take Xarelto exactly as prescribed by your doctor and DO NOT stop taking Xarelto without talking to the doctor who prescribed the medication.  Stopping without other stroke prevention medication to take the place of Xarelto may increase your risk of developing a clot that causes a stroke.  Refill your prescription before you run out.  After discharge, you should have regular check-up appointments with your healthcare provider that is prescribing your Xarelto.  In the future your dose may need to be changed if your kidney function or weight changes by a significant amount.  What do you do if you miss a dose? If you are taking Xarelto ONCE DAILY and you miss a dose, take it as soon as you remember on the same day then continue your regularly scheduled once daily regimen the next day. Do not take two doses of Xarelto at the same time or on the same day.   Important Safety Information A possible side effect of Xarelto is bleeding. You should call your healthcare provider right away if you experience any of the following: ? Bleeding from an injury or your nose that does not stop. ? Unusual colored urine (red or dark brown) or unusual colored stools (red or  black). ? Unusual bruising for unknown reasons. ? A serious fall or if you hit your head (even if there is no bleeding).  Some medicines may interact with Xarelto and might increase your risk of bleeding while on Xarelto. To help avoid this, consult your healthcare provider or pharmacist prior to using any new prescription or non-prescription medications, including herbals, vitamins, non-steroidal anti-inflammatory drugs (NSAIDs) and supplements.  This website has more information on Xarelto: [email or web address].[email or web address]    Chest Pain (Nonspecific) It is often hard to give a specific diagnosis for the cause of chest pain. There is always a chance that your pain could be related to something serious, such as a heart attack or a blood clot in the lungs. You need to follow up with your health care provider for further evaluation. CAUSES   Heartburn.  Pneumonia or bronchitis.  Anxiety or stress.  Inflammation around your heart (pericarditis) or lung (pleuritis or pleurisy).  A blood clot in the lung.  A collapsed lung (pneumothorax). It can develop suddenly on its own (spontaneous pneumothorax) or from trauma to the chest.  Shingles infection (herpes zoster virus). The chest wall is composed of bones, muscles, and cartilage. Any of these can be the source of the pain.  The bones can be bruised by injury.  The muscles or cartilage can be strained by coughing or overwork.  The cartilage can be affected by inflammation and become sore (costochondritis). DIAGNOSIS  Lab tests or other studies may be needed to find the cause  of your pain. Your health care provider may have you take a test called an ambulatory electrocardiogram (ECG). An ECG records your heartbeat patterns over a 24-hour period. You may also have other tests, such as:  Transthoracic echocardiogram (TTE). During echocardiography, sound waves are used to evaluate how blood flows through your heart.  Transesophageal echocardiogram  (TEE).  Cardiac monitoring. This allows your health care provider to monitor your heart rate and rhythm in real time.  Holter monitor. This is a portable device that records your heartbeat and can help diagnose heart arrhythmias. It allows your health care provider to track your heart activity for several days, if needed.  Stress tests by exercise or by giving medicine that makes the heart beat faster. TREATMENT   Treatment depends on what may be causing your chest pain. Treatment may include:  Acid blockers for heartburn.  Anti-inflammatory medicine.  Pain medicine for inflammatory conditions.  Antibiotics if an infection is present.  You may be advised to change lifestyle habits. This includes stopping smoking and avoiding alcohol, caffeine, and chocolate.  You may be advised to keep your head raised (elevated) when sleeping. This reduces the chance of acid going backward from your stomach into your esophagus. Most of the time, nonspecific chest pain will improve within 2-3 days with rest and mild pain medicine.  HOME CARE INSTRUCTIONS   If antibiotics were prescribed, take them as directed. Finish them even if you start to feel better.  For the next few days, avoid physical activities that bring on chest pain. Continue physical activities as directed.  Do not use any tobacco products, including cigarettes, chewing tobacco, or electronic cigarettes.  Avoid drinking alcohol.  Only take medicine as directed by your health care provider.  Follow your health care provider's suggestions for further testing if your chest pain does not go away.  Keep any follow-up appointments you made. If you do not go to an appointment, you could develop lasting (chronic) problems with pain. If there is any problem keeping an appointment, call to reschedule. SEEK MEDICAL CARE IF:   Your chest pain does not go away, even after treatment.  You have a rash with blisters on your chest.  You have  a fever. SEEK IMMEDIATE MEDICAL CARE IF:   You have increased chest pain or pain that spreads to your arm, neck, jaw, back, or abdomen.  You have shortness of breath.  You have an increasing cough, or you cough up blood.  You have severe back or abdominal pain.  You feel nauseous or vomit.  You have severe weakness.  You faint.  You have chills. This is an emergency. Do not wait to see if the pain will go away. Get medical help at once. Call your local emergency services (911 in [place] Do not drive yourself to the hospital. MAKE SURE YOU:   Understand these instructions.  Will watch your condition.  Will get help right away if you are not doing well or get worse. Document Released: 01/16/2005 Document Revised: 04/13/2013 Document Reviewed: 11/12/2007 [facility] Patient Information 2015 [place], [place]. This information is not intended to replace advice given to you by your health care provider. Make sure you discuss any questions you have with your health care provider.

## 2014-11-03 NOTE — ED Notes (Addendum)
Pt states fast hr on monitor at home.  HR 145, a flutter in triage.  Pt also c/o L shoulder pain.  Was seen last tues for same.

## 2014-11-03 NOTE — ED Notes (Signed)
Pt did not take meds prescribed last week b/c they are still coming in mail.

## 2014-11-03 NOTE — H&P (Signed)
Calvin [name] is an 81 y.o. male.    Primary Cardiologist:Dr. Irish Lack   PCP: [name], MD  Chief Complaint: HR 46s  HPI: 81 year old male seen in ER 10/27/14 for a flutter RVR and IV dilt slowed the rate. CHADS-VASc (at least 5) and pt ordered for Xarelto and coreg with stopping Bystolic.  Unfortunately pt has not yet had the Xarelto.  He needed prior approval and then [facility].  [facility] to be seen tomorrow in [facility].  He has hx of CAD with CABG-9 years ago and resection of atrial myxoma.  He did have post op a flutter with DCCV 2007.   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 He has known underlying pulmonary disease is currently somewhat limited by shortness of breath with COPD. He uses oxygen continuously.  He has DM, hyperlipidemia, rt CEA and stable carotid disease with 50% L carotid dz,  Last echo 2013 with EF 55-60%. Last nuc 2007 without ischemia and EF 60%. Hx mild renal art. Stenosis bil.    Today presented to ER for HR of 145 and Lt shoulder pain and lt ant chest pain.    Pt had mowed grass on riding lawn mower and afterwards his HR was 160.  He rested for awhile but HR never less than 145.  EKG 2: 1 a flutter with HR 147.  ST elevation with flutter wave.  Troponin Negative 0.01 today.   Now on IV dilt his HR is 100.  Past Medical History  Diagnosis Date  . HTN (hypertension)   . History of lung cancer   . Hypertriglyceridemia   . CAD (coronary artery disease)   . Atrial myxoma     Left  . Carotid artery disease   . Hearing loss   . Diabetes mellitus   . Arthritis   . COPD (chronic obstructive pulmonary disease)   . Cancer   . Atrial flutter with rapid ventricular response 11/03/2014    Past Surgical History  Procedure Laterality Date  . Cabg x 1 with reverse saphenous vein graft to distal rt coronary artery and excision of left atrial myxoma  07/12/2005    Dr Servando Snare  . Left pneumonectomy   05/03/1998    Dr Servando Snare  . Combined mediastinoscopy and bronchoscopy  05/01/1998    Dr Servando Snare  . Rt  carotid endarterectomy      Dr Kellie Simmering  . Coronary artery bypass graft    . Carotid endarterectomy  2007    Right CEA  . Lung removal, partial  2000    Left lung removed     Family History  Problem Relation Age of Onset  . Heart disease Mother     NOT before age 71  . Heart attack Mother   . Heart disease Sister   . Cancer Sister     Breast  . Stomach cancer Father   . Cancer Father     Stomach   Social History:  reports that he quit smoking about 17 years ago. His smoking use included Cigarettes. He has quit using smokeless tobacco. He reports that he does not drink alcohol or use illicit drugs.  Allergies:  Allergies  Allergen Reactions  . Statins Other (See Comments)    Muscle aches  . Sulfa Antibiotics   . Amoxicillin Rash   CXR: from last week the 7th.  CLINICAL DATA: Palpitations. Left pneumonectomy.  EXAM: PORTABLE  CHEST - 1 VIEW  COMPARISON: 05/19/2014  FINDINGS: Postop CABG. Left pneumonectomy with extensive pleural calcification on the left which is stable  Right lung is well aerated and clear. Negative for pneumonia or heart failure. No right pleural effusion.  IMPRESSION: Postop left pneumonectomy. No acute abnormality.       OUTPATIENT MEDICATIONS: No current facility-administered medications on file prior to encounter.   Current Outpatient Prescriptions on File Prior to Encounter  Medication Sig Dispense Refill  . acetaminophen (TYLENOL) 325 MG tablet Take 650 mg by mouth every 6 (six) hours as needed. For pain    . acetaZOLAMIDE (DIAMOX) 250 MG tablet Take 1 tablet (250 mg total) by mouth 2 (two) times daily. 30 tablet 0  . albuterol (PROVENTIL HFA;VENTOLIN HFA) 108 (90 BASE) MCG/ACT inhaler Inhale 2 puffs into the lungs every 6 (six) hours as needed for wheezing.    . carvedilol (COREG) 12.5 MG tablet Take 1 tablet (12.5 mg  total) by mouth 2 (two) times daily with a meal. 60 tablet 0  . cetirizine (ZYRTEC) 10 MG tablet Take 10 mg by mouth daily as needed. For allergies    . cholecalciferol (VITAMIN D) 1000 UNITS tablet Take 2,000 Units by mouth 2 (two) times daily.     Marland Kitchen EPIPEN 2-PAK 0.3 MG/0.3ML DEVI Inject 0.3 mg into the skin See admin instructions. Take as needed for severe allergic reaction    . felodipine (PLENDIL) 10 MG 24 hr tablet Take 1 tablet (10 mg total) by mouth daily. 90 tablet 3  . ipratropium-albuterol (DUONEB) 0.5-2.5 (3) MG/3ML SOLN Take 3 mLs by nebulization 3 (three) times daily. 540 mL 1  . losartan-hydrochlorothiazide (HYZAAR) 100-25 MG per tablet Take 1 tablet by mouth daily. 90 tablet 3  . magnesium oxide (MAG-OX) 400 MG tablet Take 400 mg by mouth daily.    . metFORMIN (GLUCOPHAGE) 500 MG tablet Take 500 mg by mouth daily with breakfast.     . Multiple Vitamins-Minerals (MULTIVITAMINS THER. W/MINERALS) TABS Take 1 tablet by mouth daily.    . nebivolol (BYSTOLIC) 10 MG tablet Take 1 tablet (10 mg total) by mouth daily. 90 tablet 3  . Omega-3 Fatty Acids (FISH OIL) 1200 MG CAPS Take 1 capsule by mouth 2 (two) times daily.    . ranitidine (ZANTAC) 150 MG capsule Take 150 mg by mouth at bedtime as needed.    . rivaroxaban (XARELTO) 20 MG TABS tablet Take 1 tablet (20 mg total) by mouth daily with supper. 90 tablet 3   Has not had any Xarelto.  Results for orders placed or performed during the hospital encounter of 11/03/14 (from the past 48 hour(s))  Basic metabolic panel     Status: Abnormal   Collection Time: 11/03/14  2:05 PM  Result Value Ref Range   Sodium 138 135 - 145 mmol/L   Potassium 3.8 3.5 - 5.1 mmol/L   Chloride 97 (L) 101 - 111 mmol/L   CO2 31 22 - 32 mmol/L   Glucose, Bld 170 (H) 65 - 99 mg/dL   BUN 24 (H) 6 - 20 mg/dL   Creatinine, Ser 1.45 (H) 0.61 - 1.24 mg/dL   Calcium 9.6 8.9 - 10.3 mg/dL   GFR calc non Af Amer 44 (L) >60 mL/min   GFR calc Af Amer 51 (L) >60 mL/min     Comment: (NOTE) The eGFR has been calculated using the CKD EPI equation. This calculation has not been validated in all clinical situations. eGFR's persistently <60 mL/min signify  possible Chronic Kidney Disease.    Anion gap 10 5 - 15  CBC     Status: Abnormal   Collection Time: 11/03/14  2:05 PM  Result Value Ref Range   WBC 10.0 4.0 - 10.5 K/uL   RBC 3.96 (L) 4.22 - 5.81 MIL/uL   Hemoglobin 12.5 (L) 13.0 - 17.0 g/dL   HCT 37.3 (L) 39.0 - 52.0 %   MCV 94.2 78.0 - 100.0 fL   MCH 31.6 26.0 - 34.0 pg   MCHC 33.5 30.0 - 36.0 g/dL   RDW 13.5 11.5 - 15.5 %   Platelets 212 150 - 400 K/uL  Troponin I     Status: None   Collection Time: 11/03/14  2:05 PM  Result Value Ref Range   Troponin I <0.03 <0.031 ng/mL    Comment:        NO INDICATION OF MYOCARDIAL INJURY.   Magnesium     Status: None   Collection Time: 11/03/14  2:05 PM  Result Value Ref Range   Magnesium 2.1 1.7 - 2.4 mg/dL  I-Stat Troponin, ED (not at [facility])     Status: None   Collection Time: 11/03/14  2:10 PM  Result Value Ref Range   Troponin i, poc 0.01 0.00 - 0.08 ng/mL   Comment 3            Comment: Due to the release kinetics of cTnI, a negative result within the first hours of the onset of symptoms does not rule out myocardial infarction with certainty. If myocardial infarction is still suspected, repeat the test at appropriate intervals.    No results found.  ROS: General:no colds or fevers, no weight changes Skin:no rashes or ulcers, + tan HEENT:no blurred vision, no congestion CV:see HPI PUL:see HPI GI:no diarrhea constipation or melena, no indigestion GU:no hematuria, no dysuria MS:no joint pain, no claudication Neuro:no syncope, no lightheadedness Endo:+ diabetes, no thyroid disease  Blood pressure 112/53, pulse 50, temperature 98.3 F (36.8 C), temperature source Oral, resp. rate 15, SpO2 100 %. PE: General:Pleasant affect, NAD Skin:Warm and dry, brisk capillary  refill HEENT:normocephalic, sclera clear, mucus membranes moist, very hard of hearing Neck:supple, no JVD, no bruits  Heart:irreg irreg without murmur, gallup, rub or click Lungs:clear- though minimal breath sounds on Lt, without rales, rhonchi, or wheezes WIO:XBDZ, non tender, + BS, do not palpate liver spleen or masses Ext:tr to 1+ lower ext edema, 2+ pedal pulses, 2+ radial pulses Neuro:alert and oriented X 3, MAE, follows commands, + facial symmetry    Assessment/Plan Principal Problem:   Atrial flutter with rapid ventricular response- now improved on dilt drip, he is on coreg 12.5 BID -CHADS-VASc (at least 5) but he has not been able to obtain his xarelto for last week.  Will begin today.  Active Problems:   HTN (hypertension)-controlled   History of lung cancer   Hypertriglyceridemia   CAD (coronary artery disease), CABG 2007- no nuc since surgery no chest pain and neg. Troponin but with 79 year old grafts may be prudent to do nuc study- possible ischemia as source of flutter.  Today with activity HR up to 160     Atrial myxoma, resection 2007   Diabetes mellitus   COPD (chronic obstructive pulmonary disease)- on home oxygen   Hx of pneumonectomy   COPD, very severe    CKD 3 Cr. Elevated today may be a little dry after mowing in the heat.     [place] Nurse Practitioner Certified [facility] HEART[facility] Pager [phone] or after 5pm or weekends call [phone] 11/03/2014, 3:13 PM

## 2014-11-03 NOTE — Care Management Note (Signed)
Case Management Note  Patient Details  Name: Calvin [name] MRN: [id number] Date of Birth: 1933/02/18  Subjective/Objective:                  Xarelto Medication Assistance Free 30 day trial card  Action/Plan: Xarelto Free 30 Day trial Card   Expected Discharge Date:                  Expected Discharge Plan:  Home/Self Care  In-House Referral:     Discharge planning Services  CM Consult, Medication Assistance (Xarelto Free 30 day trial card given to patient's wife)  Post Acute Care Choice:    Choice offered to:     DME Arranged:    DME Agency:     HH Arranged:    [place] Agency:     Status of Service:  In process, will continue to follow  Medicare Important Message Given:    Date Medicare IM Given:    Medicare IM give by:    Date Additional Medicare IM Given:    Additional Medicare Important Message give by:     If discussed at [place] of Stay Meetings, dates discussed:    Additional Comments: Patient was seen in the ED last week but discharge without receiving the Free 30 day Xarelto card.  ED CM spoke with patient and wife, patient received a prescription from ED last week, and took it to [facility] but was not able to fill needed a prior [place] for insurance.  ED CM provided Xarelto Free 30 day trial card.  Wife was given card to take to pharmacy with prescrioption to receive the first 30 days of medication free. Patient and wife both verbalized understanding. No further ED CM needs identified. Unit CM will follow for any additional CM needs.   Laurena Slimmer, RN 11/03/2014, 5:13 PM

## 2014-11-03 NOTE — ED Provider Notes (Signed)
CSN: [id number]     Arrival date & time 11/03/14  1339 History   First MD Initiated Contact with Patient 11/03/14 1359     Chief Complaint  Patient presents with  . Tachycardia  . Shoulder Pain     (Consider location/radiation/quality/duration/timing/severity/associated sxs/prior Treatment) HPI   81 year old male with palpitations. Patient was seen by myself in the emergency room on 7/7 and evaluated by cardiology. Atrial flutter. He was rate controlled at that time with IV Cardizem. Plan was to stop Plavix. Start Xarelto. Increase Coreg to 12.5 mg. He was to follow-up with cardiology and actually  had an appointment for tomorrow. Today he was out mowing his lawn on riding mower and afterwards noted that his heart rate was significantly elevated at approximately 150-160 bpm associated with pain in L shoulder. This did not improve with rest so he presented to the emergency room.  Of note, patient did not start his Xarelto because he required prior authorization and has it ordered.   Past Medical History  Diagnosis Date  . HTN (hypertension)   . History of lung cancer   . Hypertriglyceridemia   . CAD (coronary artery disease)   . Atrial myxoma     Left  . Carotid artery disease   . Hearing loss   . Diabetes mellitus   . Arthritis   . COPD (chronic obstructive pulmonary disease)   . Cancer   . Atrial flutter with rapid ventricular response 11/03/2014   Past Surgical History  Procedure Laterality Date  . Cabg x 1 with reverse saphenous vein graft to distal rt coronary artery and excision of left atrial myxoma  07/12/2005    Dr Servando Snare  . Left pneumonectomy  05/03/1998    Dr Servando Snare  . Combined mediastinoscopy and bronchoscopy  05/01/1998    Dr Servando Snare  . Rt  carotid endarterectomy      Dr Kellie Simmering  . Coronary artery bypass graft    . Carotid endarterectomy  2007    Right CEA  . Lung removal, partial  2000    Left lung removed    Family History  Problem Relation Age of Onset   . Heart disease Mother     NOT before age 73  . Heart attack Mother   . Heart disease Sister   . Cancer Sister     Breast  . Stomach cancer Father   . Cancer Father     Stomach   History  Substance Use Topics  . Smoking status: Former Smoker    Types: Cigarettes    Quit date: 07/21/1997  . Smokeless tobacco: Former Systems developer  . Alcohol Use: No    Review of Systems  All systems reviewed and negative, other than as noted in HPI.   Allergies  Statins; Sulfa antibiotics; and Amoxicillin  Home Medications   Prior to Admission medications   Medication Sig Start Date End Date Taking? Authorizing Provider  acetaminophen (TYLENOL) 325 MG tablet Take 650 mg by mouth every 6 (six) hours as needed. For pain    Historical Provider, MD  acetaZOLAMIDE (DIAMOX) 250 MG tablet Take 1 tablet (250 mg total) by mouth 2 (two) times daily. 09/06/13   Kinnie Feil, MD  albuterol (PROVENTIL HFA;VENTOLIN HFA) 108 (90 BASE) MCG/ACT inhaler Inhale 2 puffs into the lungs every 6 (six) hours as needed for wheezing.    Historical Provider, MD  carvedilol (COREG) 12.5 MG tablet Take 1 tablet (12.5 mg total) by mouth 2 (two) times daily with a  meal. 10/27/14   Virgel Manifold, MD  cetirizine (ZYRTEC) 10 MG tablet Take 10 mg by mouth daily as needed. For allergies    Historical Provider, MD  cholecalciferol (VITAMIN D) 1000 UNITS tablet Take 2,000 Units by mouth 2 (two) times daily.     Historical Provider, MD  EPIPEN 2-PAK 0.3 MG/0.3ML DEVI Inject 0.3 mg into the skin See admin instructions. Take as needed for severe allergic reaction 11/04/11   Historical Provider, MD  felodipine (PLENDIL) 10 MG 24 hr tablet Take 1 tablet (10 mg total) by mouth daily. 01/10/14   Jettie Booze, MD  ipratropium-albuterol (DUONEB) 0.5-2.5 (3) MG/3ML SOLN Take 3 mLs by nebulization 3 (three) times daily. 10/25/14   Brand Males, MD  losartan-hydrochlorothiazide (HYZAAR) 100-25 MG per tablet Take 1 tablet by mouth daily. 01/10/14    Jettie Booze, MD  magnesium oxide (MAG-OX) 400 MG tablet Take 400 mg by mouth daily.    Historical Provider, MD  metFORMIN (GLUCOPHAGE) 500 MG tablet Take 500 mg by mouth daily with breakfast.  03/24/14   Historical Provider, MD  Multiple Vitamins-Minerals (MULTIVITAMINS THER. W/MINERALS) TABS Take 1 tablet by mouth daily.    Historical Provider, MD  nebivolol (BYSTOLIC) 10 MG tablet Take 1 tablet (10 mg total) by mouth daily. 01/10/14   Jettie Booze, MD  Omega-3 Fatty Acids (FISH OIL) 1200 MG CAPS Take 1 capsule by mouth 2 (two) times daily.    Historical Provider, MD  ranitidine (ZANTAC) 150 MG capsule Take 150 mg by mouth at bedtime as needed.    Historical Provider, MD  rivaroxaban (XARELTO) 20 MG TABS tablet Take 1 tablet (20 mg total) by mouth daily with supper. 11/01/14   Jettie Booze, MD   BP 120/61 mmHg  Pulse 83  Temp(Src) 98.3 F (36.8 C) (Oral)  Resp 16  SpO2 98% Physical Exam  Constitutional: He appears well-developed and well-nourished. No distress.  Sitting up in bed. NAD. Hard of hearing.   HENT:  Head: Normocephalic and atraumatic.  Eyes: Conjunctivae are normal. Right eye exhibits no discharge. Left eye exhibits no discharge.  Neck: Neck supple.  Cardiovascular: Regular rhythm and normal heart sounds.  Exam reveals no gallop and no friction rub.   No murmur heard. Regular. Tachycardic.   Pulmonary/Chest: Effort normal. No respiratory distress. He has wheezes.  Abdominal: Soft. He exhibits no distension. There is no tenderness.  Musculoskeletal: He exhibits no edema or tenderness.  Neurological: He is alert.  Skin: Skin is warm and dry.  Psychiatric: He has a normal mood and affect. His behavior is normal. Thought content normal.  Nursing note and vitals reviewed.   ED Course  Procedures (including critical care time) Labs Review Labs Reviewed  BASIC METABOLIC PANEL - Abnormal; Notable for the following:    Chloride 97 (*)    Glucose, Bld  170 (*)    BUN 24 (*)    Creatinine, Ser 1.45 (*)    GFR calc non Af Amer 44 (*)    GFR calc Af Amer 51 (*)    All other components within normal limits  CBC - Abnormal; Notable for the following:    RBC 3.96 (*)    Hemoglobin 12.5 (*)    HCT 37.3 (*)    All other components within normal limits  TROPONIN I  MAGNESIUM  I-STAT TROPOININ, ED    Imaging Review No results found.   EKG Interpretation   Date/Time:  Thursday November 03 2014 13:46:36 EDT Ventricular  Rate:  147 PR Interval:    QRS Duration: 78 QT Interval:  300 QTC Calculation: 469 R Axis:   74 Text Interpretation:  Atrial flutter with 2:1 A-V conduction ST elevation  consider inferior injury or acute infarct Confirmed by Gaudencio Chesnut  MD, Dawn Convery  ([id number]) on 11/03/2014 1:51:15 PM      MDM   Final diagnoses:  Atrial fibrillation and flutter    81 year old male with HR flutter with 2-1 conduction. Will try to control rate. Cardiology consultation.    Virgel Manifold, MD 11/04/14 1447

## 2014-11-04 ENCOUNTER — Encounter: Payer: Medicare Other | Admitting: Nurse Practitioner

## 2014-11-04 DIAGNOSIS — Z7901 Long term (current) use of anticoagulants: Secondary | ICD-10-CM

## 2014-11-04 DIAGNOSIS — I4892 Unspecified atrial flutter: Secondary | ICD-10-CM | POA: Diagnosis not present

## 2014-11-04 LAB — BASIC METABOLIC PANEL
Anion gap: 4 — ABNORMAL LOW (ref 5–15)
BUN: 21 mg/dL — ABNORMAL HIGH (ref 6–20)
CO2: 38 mmol/L — ABNORMAL HIGH (ref 22–32)
Calcium: 9.5 mg/dL (ref 8.9–10.3)
Chloride: 96 mmol/L — ABNORMAL LOW (ref 101–111)
Creatinine, Ser: 1.4 mg/dL — ABNORMAL HIGH (ref 0.61–1.24)
GFR calc Af Amer: 54 mL/min — ABNORMAL LOW (ref >60)
GFR calc non Af Amer: 46 mL/min — ABNORMAL LOW (ref >60)
Glucose, Bld: 116 mg/dL — ABNORMAL HIGH (ref 65–99)
Potassium: 3.7 mmol/L (ref 3.5–5.1)
Sodium: 138 mmol/L (ref 135–145)

## 2014-11-04 MED ORDER — DILTIAZEM HCL ER COATED BEADS 180 MG PO CP24: 180.0000 mg | ORAL_CAPSULE | Freq: Every day | ORAL | Status: DC

## 2014-11-04 MED ORDER — RIVAROXABAN 20 MG PO TABS: 20.0000 mg | ORAL_TABLET | Freq: Every day | ORAL | Status: DC

## 2014-11-04 MED ORDER — CARVEDILOL 12.5 MG PO TABS: 12.5000 mg | ORAL_TABLET | Freq: Two times a day (BID) | ORAL | Status: DC

## 2014-11-04 NOTE — Discharge Summary (Signed)
Patient ID: Calvin [name],  MRN: [id number], DOB/AGE: 28-Jun-1932 81 y.o.  Admit date: 11/03/2014 Discharge date: 11/04/2014  Primary Care Provider:  [name], MD Primary Cardiologist: Dr Irish Lack  Discharge Diagnoses Principal Problem:   Chest pain associated with tachycardia Active Problems:   Atrial flutter with rapid ventricular response   HTN (hypertension)   CAD (coronary artery disease), CABG 2007   Diabetes mellitus   COPD (chronic obstructive pulmonary disease)   Chronic anticoagulation-Xarelto prescribed 10/27/14   History of lung cancer   Hypertriglyceridemia   Atrial myxoma, resection 2007   COPD, very severe    Hospital Course:  81 year old male seen in ER 10/27/14 for atrial flutter RVR. IV dilt slowed the rate. CHADS-VASc (at least 5) and pt ordered for Xarelto and coreg,(stopping Bystolic). Unfortunately pt has not yet had the Xarelto. He needed prior approval and then [facility].He has hx of  CABG-9 years ago and resection of atrial myxoma. He did have post op a flutter with DCCV 2007.16 years ago he underwent left pneumonectomy for squamous cell lung carcinoma. He has known underlying pulmonary disease is currently somewhat limited by shortness of breath with COPD. He uses oxygen continuously. He has DM, hyperlipidemia, rt CEA and stable carotid disease with 50% L carotid dz, Last echo 2013 with EF 55-60%. Last nuc 2007 without ischemia and EF 60%. He  presented to ER 11/03/14 with a HR of 145 and Lt shoulder pain and lt ant chest pain. Pt had mowed grass on riding lawn mower and afterwards his HR was 160. He rested for awhile but HR never less than 145. EKG 2: 1 a flutter with HR 147. ST elevation with flutter wave. Troponin Negative 0.01. He was admitted and started on IV Diltiazem. His Xarelto was started as well.  On the morning of the 15th he was changed to po Dil;tiazem 180 mg and we stopped his home dose Plendil. His rate is  controlled and Dr Acie Fredrickson feels he can be discharged. He should be seen in the office as aTOC f/u in 7-10 days to check his rate and make sure he has received his medications. 30 day Rx's were sent to [facility] in [place] at discharge.  Discharge Vitals:  Blood pressure 114/58, pulse 74, temperature 97.5 F (36.4 C), temperature source Oral, resp. rate 20, height '5\' 10"'$  (1.778 m), weight 217 lb 6.4 oz (98.612 kg), SpO2 100 %.    Labs: Results for orders placed or performed during the hospital encounter of 11/03/14 (from the past 24 hour(s))  Basic metabolic panel     Status: Abnormal   Collection Time: 11/03/14  2:05 PM  Result Value Ref Range   Sodium 138 135 - 145 mmol/L   Potassium 3.8 3.5 - 5.1 mmol/L   Chloride 97 (L) 101 - 111 mmol/L   CO2 31 22 - 32 mmol/L   Glucose, Bld 170 (H) 65 - 99 mg/dL   BUN 24 (H) 6 - 20 mg/dL   Creatinine, Ser 1.45 (H) 0.61 - 1.24 mg/dL   Calcium 9.6 8.9 - 10.3 mg/dL   GFR calc non Af Amer 44 (L) >60 mL/min   GFR calc Af Amer 51 (L) >60 mL/min   Anion gap 10 5 - 15  CBC     Status: Abnormal   Collection Time: 11/03/14  2:05 PM  Result Value Ref Range   WBC 10.0 4.0 - 10.5 K/uL   RBC 3.96 (L) 4.22 -  5.81 MIL/uL   Hemoglobin 12.5 (L) 13.0 - 17.0 g/dL   HCT 37.3 (L) 39.0 - 52.0 %   MCV 94.2 78.0 - 100.0 fL   MCH 31.6 26.0 - 34.0 pg   MCHC 33.5 30.0 - 36.0 g/dL   RDW 13.5 11.5 - 15.5 %   Platelets 212 150 - 400 K/uL  Troponin I     Status: None   Collection Time: 11/03/14  2:05 PM  Result Value Ref Range   Troponin I <0.03 <0.031 ng/mL  Magnesium     Status: None   Collection Time: 11/03/14  2:05 PM  Result Value Ref Range   Magnesium 2.1 1.7 - 2.4 mg/dL  I-Stat Troponin, ED (not at [facility])     Status: None   Collection Time: 11/03/14  2:10 PM  Result Value Ref Range   Troponin i, poc 0.01 0.00 - 0.08 ng/mL   Comment 3          Magnesium     Status: None   Collection Time: 11/03/14  4:46 PM  Result Value Ref Range   Magnesium 2.2 1.7 - 2.4 mg/dL    T4, free     Status: None   Collection Time: 11/03/14  4:46 PM  Result Value Ref Range   Free T4 0.86 0.61 - 1.12 ng/dL  TSH     Status: None   Collection Time: 11/03/14  4:47 PM  Result Value Ref Range   TSH 1.609 0.350 - 4.500 uIU/mL  Troponin I *Canceled*     Status: None ()   Collection Time: 11/03/14  8:00 PM   Narrative   LIS Cancel (ORR/DE = Data Error)  Basic metabolic panel     Status: Abnormal   Collection Time: 11/04/14  4:31 AM  Result Value Ref Range   Sodium 138 135 - 145 mmol/L   Potassium 3.7 3.5 - 5.1 mmol/L   Chloride 96 (L) 101 - 111 mmol/L   CO2 38 (H) 22 - 32 mmol/L   Glucose, Bld 116 (H) 65 - 99 mg/dL   BUN 21 (H) 6 - 20 mg/dL   Creatinine, Ser 1.40 (H) 0.61 - 1.24 mg/dL   Calcium 9.5 8.9 - 10.3 mg/dL   GFR calc non Af Amer 46 (L) >60 mL/min   GFR calc Af Amer 54 (L) >60 mL/min   Anion gap 4 (L) 5 - 15    Disposition:  Follow-up Information    Follow up with Jettie Booze., MD.   Specialties:  Cardiology, Radiology, Interventional Cardiology   Why:  office will contact you   Contact information:   [address] [place] [address] [phone]       Discharge Medications:    Medication List    STOP taking these medications        acetaZOLAMIDE 250 MG tablet  Commonly known as:  DIAMOX     clopidogrel 75 MG tablet  Commonly known as:  PLAVIX     felodipine 10 MG 24 hr tablet  Commonly known as:  PLENDIL     nebivolol 10 MG tablet  Commonly known as:  BYSTOLIC      TAKE these medications        acetaminophen 325 MG tablet  Commonly known as:  TYLENOL  Take 325 mg by mouth every 6 (six) hours as needed for mild pain, moderate pain or headache. For pain     albuterol 108 (90 BASE) MCG/ACT inhaler  Commonly known as:  PROVENTIL HFA;VENTOLIN HFA  Inhale 2 puffs into the lungs every 6 (six) hours as needed for wheezing.     carvedilol 12.5 MG tablet  Commonly known as:  COREG  Take 1 tablet (12.5 mg  total) by mouth 2 (two) times daily with a meal.     cetirizine 10 MG tablet  Commonly known as:  ZYRTEC  Take 10 mg by mouth daily as needed for allergies or rhinitis. For allergies     cholecalciferol 1000 UNITS tablet  Commonly known as:  VITAMIN D  Take 2,000 Units by mouth 2 (two) times daily.     diltiazem 180 MG 24 hr capsule  Commonly known as:  CARDIZEM CD  Take 1 capsule (180 mg total) by mouth daily.     EPIPEN 2-PAK 0.3 mg/0.3 mL Soaj injection  Generic drug:  EPINEPHrine  Inject 0.3 mg into the skin See admin instructions. Take as needed for severe allergic reaction     Fish Oil 1200 MG Caps  Take 1 capsule by mouth 2 (two) times daily.     ipratropium-albuterol 0.5-2.5 (3) MG/3ML Soln  Commonly known as:  DUONEB  Take 3 mLs by nebulization 3 (three) times daily.     losartan-hydrochlorothiazide 100-25 MG per tablet  Commonly known as:  HYZAAR  Take 1 tablet by mouth daily.     magnesium oxide 400 MG tablet  Commonly known as:  MAG-OX  Take 400 mg by mouth at bedtime.     metFORMIN 500 MG tablet  Commonly known as:  GLUCOPHAGE  Take 500 mg by mouth daily with breakfast.     multivitamins ther. w/minerals Tabs tablet  Take 1 tablet by mouth daily.     ranitidine 150 MG capsule  Commonly known as:  ZANTAC  Take 150 mg by mouth at bedtime as needed for heartburn.     rivaroxaban 20 MG Tabs tablet  Commonly known as:  XARELTO  Take 1 tablet (20 mg total) by mouth daily with supper.         Duration of Discharge Encounter: Greater than 30 minutes including physician time.  Angelena Form PA-C 11/04/2014 11:25 AM   Attending Note:   The patient was seen and examined.  Agree with assessment and plan as noted above.  Changes made to the above note as needed.  Pt ready for DC .  See my progress note from earlier today    Ramond Dial., MD, [facility] 11/04/2014, 6:28 PM [address],  [place] Pager [phone]

## 2014-11-04 NOTE — Progress Notes (Signed)
PROGRESS NOTE  Subjective:   81 yo with hx of atrial flutter.  Became tachycardic yesterday Now is on Coreg 12.5 BID and dilt CD 180 instead of bystolic and HR is better controlled.  HR is in the 90s.       Objective:    Vital Signs:   Temp:  [97.5 F (36.4 C)-98.4 F (36.9 C)] 97.5 F (36.4 C) (07/15 0500) Pulse Rate:  [71-146] 74 (07/15 0500) Resp:  [14-21] 20 (07/15 0500) BP: (87-144)/(43-79) 114/58 mmHg (07/15 0800) SpO2:  [93 %-100 %] 100 % (07/15 0500) FiO2 (%):  [97 %] 97 % (07/15 0732) Weight:  [98.612 kg (217 lb 6.4 oz)] 98.612 kg (217 lb 6.4 oz) (07/15 0500)  Last BM Date: 11/03/14   24-hour weight change: Weight change:   Weight trends: Filed Weights   11/03/14 1744 11/04/14 0500  Weight: 98.612 kg (217 lb 6.4 oz) 98.612 kg (217 lb 6.4 oz)    Intake/Output:  07/14 0701 - 07/15 0700 In: -  Out: 700 [Urine:700] Total I/O In: 240 [P.O.:240] Out: 600 [Urine:600]   Physical Exam: BP 114/58 mmHg  Pulse 74  Temp(Src) 97.5 F (36.4 C) (Oral)  Resp 20  Ht '5\' 10"'$  (1.778 m)  Wt 98.612 kg (217 lb 6.4 oz)  BMI 31.19 kg/m2  SpO2 100%  Wt Readings from Last 3 Encounters:  11/04/14 98.612 kg (217 lb 6.4 oz)  10/27/14 100.699 kg (222 lb)  08/31/14 99.791 kg (220 lb)    General: Vital signs reviewed and noted.   Head: Normocephalic, atraumatic.  Eyes: conjunctivae/corneas clear.  EOM's intact.   Throat: normal  Neck:  normal   Lungs:    consolidated breath sounds   Heart:  Irreg. Irreg.   Abdomen:  Soft, non-tender, non-distended    Extremities: No clubbing , cyanosis    Neurologic: A&O X3, CN II - XII are grossly intact.  , very hard of hearing   Psych: Normal     Labs: BMET:  Recent Labs  11/03/14 1405 11/03/14 1646 11/04/14 0431  NA 138  --  138  K 3.8  --  3.7  CL 97*  --  96*  CO2 31  --  38*  GLUCOSE 170*  --  116*  BUN 24*  --  21*  CREATININE 1.45*  --  1.40*  CALCIUM 9.6  --  9.5  MG 2.1 2.2  --     Liver  function tests: No results for input(s): AST, ALT, ALKPHOS, BILITOT, PROT, ALBUMIN in the last 72 hours. No results for input(s): LIPASE, AMYLASE in the last 72 hours.  CBC:  Recent Labs  11/03/14 1405  WBC 10.0  HGB 12.5*  HCT 37.3*  MCV 94.2  PLT 212    Cardiac Enzymes:  Recent Labs  11/03/14 1405  TROPONINI <0.03    Coagulation Studies: No results for input(s): LABPROT, INR in the last 72 hours.  Other: Invalid input(s): POCBNP No results for input(s): DDIMER in the last 72 hours. No results for input(s): HGBA1C in the last 72 hours. No results for input(s): CHOL, HDL, LDLCALC, TRIG, CHOLHDL in the last 72 hours.  Recent Labs  11/03/14 1647  TSH 1.609   No results for input(s): VITAMINB12, FOLATE, FERRITIN, TIBC, IRON, RETICCTPCT in the last 72 hours.   Other results:  Tele   ( personally reviewed )  - atrial flutter with V rate of 90s   Medications:    Infusions: . sodium chloride  10 mL/hr at 11/03/14 1730    Scheduled Medications: . antiseptic oral rinse  7 mL Mouth Rinse BID  . carvedilol  12.5 mg Oral BID WC  . cholecalciferol  2,000 Units Oral BID  . diltiazem  180 mg Oral Daily  . famotidine  20 mg Oral Daily  . hydrochlorothiazide  25 mg Oral Daily  . ipratropium-albuterol  3 mL Nebulization TID  . losartan  100 mg Oral Daily  . magnesium oxide  400 mg Oral QHS  . multivitamin with minerals  1 tablet Oral Daily  . omega-3 acid ethyl esters  1 g Oral Daily  . rivaroxaban  20 mg Oral Q supper    Assessment/ Plan:   Principal Problem:   Atrial flutter with rapid ventricular response Active Problems:   HTN (hypertension)   History of lung cancer   Hypertriglyceridemia   CAD (coronary artery disease), CABG 2007   Atrial myxoma, resection 2007   Diabetes mellitus   COPD (chronic obstructive pulmonary disease)   Hx of pneumonectomy   COPD, very severe  1. Atrial flutter:  Rate is much better on coreg and dilt. Will ambulate him .  Possible DC this afternoon if he does well with ambulation   Please send new scripts to [facility] [employer] [place]   Have him follow up with dr. Irish Lack     Disposition: possible DC later today  Length of Stay: 1  Thayer Headings, Brooke Bonito., MD, [facility] 11/04/2014, 9:11 AM Office [phone] Pager [phone]

## 2014-11-04 NOTE — Care Management Note (Signed)
Case Management Note  Patient Details  Name: MARON STANZIONE MRN: [id number] Date of Birth: 09-Jan-1933  Subjective/Objective: Pt admitted for A flutter. Previously prescribed Xarelto, pt was not provided a 30 day free card. However, ED CM did provide card to wife 11-03-14.                    Action/Plan: Pt will need Rx to go along with the 30 day free card. CM did call [facility] on [place] and medication is available. No further needs from CM at this time.   Expected Discharge Date:                  Expected Discharge Plan:  Home/Self Care  In-House Referral:     Discharge planning Services  CM Consult, Medication Assistance (Xarelto Free 30 day trial card given to patient's wife)  Post Acute Care Choice:    Choice offered to:     DME Arranged:    DME Agency:     HH Arranged:    [place] Agency:     Status of Service:  Completed, signed off  Medicare Important Message Given:    Date Medicare IM Given:    Medicare IM give by:    Date Additional Medicare IM Given:    Additional Medicare Important Message give by:     If discussed at [employer] of Stay Meetings, dates discussed:    Additional Comments:  Bethena Roys, RN 11/04/2014, 11:04 AM

## 2014-11-04 NOTE — Progress Notes (Signed)
Discharge instructions and teaching reviewed with pt. VSS. Wife at bedside, discharging home with wife

## 2014-11-04 NOTE — Progress Notes (Signed)
UR Completed Elsie Baynes Graves-Bigelow, RN,BSN [phone]  

## 2014-11-04 NOTE — Progress Notes (Signed)
Pt ambulated around unit, 2L of 02 sating 92%, HR 95-115, asymtomatic, no complaints MD notified

## 2014-11-04 NOTE — Telephone Encounter (Signed)
Follow up      TCM appt on 11-09-14 with Calvin [name].

## 2014-11-07 NOTE — Telephone Encounter (Signed)
Attempted to call pt RE: d/c - NA X multiple rings/no VM

## 2014-11-08 NOTE — Telephone Encounter (Signed)
Patient contacted regarding discharge from [facility] on 11/04/14.  Patient understands to follow up with provider Ignacia Bayley NP on 11/09/14 at 1000 at [facility]. Patient understands discharge instructions? yes Patient understands medications and regiment? yes Patient understands to bring all medications to this visit? yes  Spoke with the pt and he verbally gave me consent to speak with his spouse due to being hard-of-hearing.  Per the wife, the pts HR is finally coming back down to normal.  Wife states the pt has no complaints or questions at this time.  Advised the wife that when she brings the pt to his f/u appt tomorrow, to have a DPR form signed saying we can speak to her about the pts medical information and due to the pt being hard of hearing.

## 2014-11-09 ENCOUNTER — Encounter: Payer: Self-pay | Admitting: Nurse Practitioner

## 2014-11-09 ENCOUNTER — Ambulatory Visit (INDEPENDENT_AMBULATORY_CARE_PROVIDER_SITE_OTHER): Payer: Medicare Other | Admitting: Nurse Practitioner

## 2014-11-09 VITALS — BP 120/64 | HR 78 | Ht 70.0 in | Wt 218.4 lb

## 2014-11-09 DIAGNOSIS — I483 Typical atrial flutter: Secondary | ICD-10-CM

## 2014-11-09 DIAGNOSIS — I2510 Atherosclerotic heart disease of native coronary artery without angina pectoris: Secondary | ICD-10-CM

## 2014-11-09 DIAGNOSIS — I4892 Unspecified atrial flutter: Secondary | ICD-10-CM

## 2014-11-09 DIAGNOSIS — I10 Essential (primary) hypertension: Secondary | ICD-10-CM | POA: Diagnosis not present

## 2014-11-09 NOTE — Progress Notes (Signed)
Patient Name: Calvin [name] Date of Encounter: 11/09/2014  Primary Care Provider:   [name], MD Primary Cardiologist:  Lendell Caprice, MD  Chief Complaint  81 year old male who presents today following recent admission for atrial flutter and rapid ventricular response.  Past Medical History   Past Medical History  Diagnosis Date  . Essential hypertension   . Hypertriglyceridemia   . CAD (coronary artery disease)     a. 06/2005 s/p CABG x1 (VG->RCA) @ time of myxoma resection.  . 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  . Carotid artery disease     a. 06/2005 s/p R CEA;  b. 10/2013 Carotid U/S: patent RICA, LICA 59-93%.  Marland Kitchen Hearing loss   . COPD (chronic obstructive pulmonary disease)   . Atrial flutter with rapid ventricular response     a. 2007 s/p DCCV following CV surgery;  b. 10/2014->CHA2DS2VASc = 5-->Xarelto.  . Squamous cell lung cancer     a. s/p L pneumonectomy in 2000.  . Type II diabetes mellitus   . Scarlet fever     a. ~ 1942.  Marland Kitchen History of pneumonia   . GERD (gastroesophageal reflux disease)   . Daily headache   . Arthritis   . Depression   . On home oxygen therapy     "2L; 24/7" (11/03/2014)   Past Surgical History  Procedure Laterality Date  . Coronary artery bypass graft  07/12/2005    Dr Servando Snare, CABG x 1 with reverse saphenous vein graft to distal rt coronary artery and excision of left atrial myxoma [Other]  . Pneumonectomy Left 05/03/1998    Dr Servando Snare  . Combined mediastinoscopy and bronchoscopy  05/01/1998    Dr Servando Snare  . Carotid endarterectomy Right 2007  . Cardiac catheterization  06/2005; 01/2010    Archie Endo 10/06/2010; Archie Endo 02/02/2010  . Transesophageal echocardiogram  06/2005    Archie Endo 09/04/2010  . Cardioversion  07/2005    DCC/notes 09/04/2010  . Renal angiogram Bilateral 12/2005    Archie Endo 09/04/2010    Allergies  Allergies  Allergen Reactions  . Statins Other (See Comments)    Muscle aches  . Sulfa Antibiotics   .  Amoxicillin Rash    HPI  81 year old male with the above complex problem list. He was recently [facility] secondary to atrial flutter with rapid ventricular response. He was advised to begin Xarelto and he was switched from by bystolic to [place]. He did have improved rate control and was able to be discharged from the emergency department. Unfortunate, he was not able to obtain his xarelto secondary to a need for prior authorization. He returned to [facility] on July 14 with ongoing tachycardia, left shoulder and chest pain. EKG showed 2-1 atrial flutter. Troponin was normal. He was placed on IV diltiazem with improved rate control. This was switched to by mouth diltiazem. Xarelto was initiated and he was subsequently discharged in rate-controlled atrial flutter. Since his discharge, he has done reasonably well. He does use oxygen 24 hours a day and that is not new for him. He has not had any chest pain or dyspnea. This morning, he did have elevations in his heart rates into the 140s. This is the first time this has happened since his discharge. With that, he did feel dyspneic. After taking his morning dose of diltiazem, his rates have since been in the 70s to 80s. He is in atrial flutter currently. He denies PND, orthopnea, dizziness, syncope,  edema, or early satiety.  Home Medications  Prior to Admission medications   Medication Sig Start Date End Date Taking? Authorizing Provider  acetaminophen (TYLENOL) 325 MG tablet Take 325 mg by mouth every 6 (six) hours as needed for mild pain, moderate pain or headache. For pain   Yes Historical Provider, MD  albuterol (PROVENTIL HFA;VENTOLIN HFA) 108 (90 BASE) MCG/ACT inhaler Inhale 2 puffs into the lungs every 6 (six) hours as needed for wheezing.   Yes Historical Provider, MD  carvedilol (COREG) 12.5 MG tablet Take 1 tablet (12.5 mg total) by mouth 2 (two) times daily with a meal. 11/04/14  Yes Erlene Quan, PA-C  cetirizine (ZYRTEC) 10  MG tablet Take 10 mg by mouth daily as needed for allergies or rhinitis. For allergies   Yes Historical Provider, MD  cholecalciferol (VITAMIN D) 1000 UNITS tablet Take 2,000 Units by mouth 2 (two) times daily.    Yes Historical Provider, MD  diltiazem (CARDIZEM CD) 180 MG 24 hr capsule Take 1 capsule (180 mg total) by mouth daily. 11/04/14  Yes Luke K Kilroy, PA-C  EPIPEN 2-PAK 0.3 MG/0.3ML DEVI Inject 0.3 mg into the skin See admin instructions. Take as needed for severe allergic reaction 11/04/11  Yes Historical Provider, MD  ipratropium-albuterol (DUONEB) 0.5-2.5 (3) MG/3ML SOLN Take 3 mLs by nebulization 3 (three) times daily. 10/25/14  Yes Brand Males, MD  losartan-hydrochlorothiazide (HYZAAR) 100-25 MG per tablet Take 1 tablet by mouth daily. 01/10/14  Yes Jettie Booze, MD  magnesium oxide (MAG-OX) 400 MG tablet Take 400 mg by mouth at bedtime.    Yes Historical Provider, MD  metFORMIN (GLUCOPHAGE) 500 MG tablet Take 500 mg by mouth daily with breakfast.  03/24/14  Yes Historical Provider, MD  Multiple Vitamins-Minerals (MULTIVITAMINS THER. W/MINERALS) TABS Take 1 tablet by mouth daily.   Yes Historical Provider, MD  Omega-3 Fatty Acids (FISH OIL) 1200 MG CAPS Take 1 capsule by mouth 2 (two) times daily.   Yes Historical Provider, MD  ranitidine (ZANTAC) 150 MG capsule Take 150 mg by mouth at bedtime as needed for heartburn.    Yes Historical Provider, MD  rivaroxaban (XARELTO) 20 MG TABS tablet Take 1 tablet (20 mg total) by mouth daily with supper. 11/04/14  Yes Erlene Quan, PA-C    Review of Systems  He was doing fine since discharge until this morning when he did have elevated heart rates associated with dyspnea. This has since resolved. He has chronic dyspnea on exertion and uses oxygen. He denies chest pain, PND, orthopnea, dizziness, syncope, edema, or early satiety.  All other systems reviewed and are otherwise negative except as noted above.  Physical Exam  VS:  BP 120/64  mmHg  Pulse 78  Ht '5\' 10"'$  (1.778 m)  Wt 218 lb 6.4 oz (99.066 kg)  BMI 31.34 kg/m2 , BMI Body mass index is 31.34 kg/(m^2). GEN: Well nourished, well developed, in no acute distress. HEENT: normal. Neck: Supple, no JVD, carotid bruits, or masses. Cardiac: Irregular, no murmurs, rubs, or gallops. No clubbing, cyanosis, trace bilateral ankle edema.  Radials/DP/PT 2+ and equal bilaterally.  Respiratory:  Respirations regular and unlabored, no breath sounds on the left, clear breath sounds on the right.  GI: Soft, nontender, nondistended, BS + x 4. MS: no deformity or atrophy. Skin: warm and dry, no rash. Neuro:  Strength and sensation are intact. Psych: Normal affect.  Accessory Clinical Findings  ECG - atrial flutter, 78, inferior Q's, no acute ST or T  changes.  Assessment & Plan  1.  Atrial flutter: Patient was recently admitted secondary to ongoing atrial flutter. He was placed on Xarelto beginning July 14. He has been compliant with it and has been taking it daily. For the most part, he has been rate controlled at home but did have some elevated rates this morning. When rates elevated, he is prone to dyspnea and did have chest pain prior to his recent admission, though troponins remain normal at that time. I discussed the potential options for management of his atrial flutter with he and his wife and have also reviewed his EKG with Dr. Lovena Le. At this time we are all in mutual agreement that the best approach would be for ongoing anticoagulation and rate control and follow-up visit in 2 weeks time. If he remains in atrial flutter at that time, we will plan cardioversion. If he becomes more symptomatic or rate is more difficult to control between now and then, we would plan on TEE cardioversion prior to that time. Finally, if following cardioversion he has recurrent atrial flutter, he will need to be referred to Dr. Lovena Le for consideration of ablation.  2. Coronary artery disease: Status  post CABG 1 at time of left atrial myxoma resection in 2007. He did have chest pain with rapid rates prior to recent hospitalization without objective evidence of ischemia. We could consider stress testing if he has recurrence of chest discomfort. Continue beta blocker therapy. He is intolerant to statins.  3. Essential hypertension: Stable.  4. Hyperlipidemia: Intolerant to statins.  5. Carotid vascular disease: Last ultrasound one year ago and showed patent right internal carotid artery with stable left internal carotid artery disease. This is followed by vascular surgery.  6. Type 2 diabetes mellitus: This is managed by primary care and he remains on metformin.   7. Disposition: I will see him back in 2 weeks at which point we will plan to schedule cardioversion if he remains in atrial flutter.    Murray Hodgkins, NP 11/09/2014, 1:51 PM

## 2014-11-09 NOTE — Patient Instructions (Signed)
Medication Instructions:  Your physician recommends that you continue on your current medications as directed. Please refer to the Current Medication list given to you today.   Labwork: None   Testing/Procedures: None   Follow-Up: Your physician recommends that you schedule a follow-up appointment on 11/23/17 with Lucillie Garfinkel  Any Other Special Instructions Will Be Listed Below (If Applicable).

## 2014-11-10 ENCOUNTER — Encounter: Payer: Self-pay | Admitting: Family

## 2014-11-14 ENCOUNTER — Other Ambulatory Visit: Payer: Self-pay

## 2014-11-14 MED ORDER — CARVEDILOL 12.5 MG PO TABS: 12.5000 mg | ORAL_TABLET | Freq: Two times a day (BID) | ORAL | Status: DC

## 2014-11-15 ENCOUNTER — Ambulatory Visit (HOSPITAL_COMMUNITY)
Admission: RE | Admit: 2014-11-15 | Discharge: 2014-11-15 | Disposition: A | Discharge status: Home / Self Care | Payer: Medicare Other | Source: Ambulatory Visit | Attending: Family | Admitting: Family

## 2014-11-15 ENCOUNTER — Ambulatory Visit (INDEPENDENT_AMBULATORY_CARE_PROVIDER_SITE_OTHER): Payer: Medicare Other | Admitting: Family

## 2014-11-15 ENCOUNTER — Encounter: Payer: Self-pay | Admitting: Family

## 2014-11-15 ENCOUNTER — Other Ambulatory Visit: Payer: Self-pay | Admitting: Family

## 2014-11-15 VITALS — BP 136/75 | HR 61 | Temp 98.2°F | Ht 70.0 in | Wt 218.0 lb

## 2014-11-15 DIAGNOSIS — Z9889 Other specified postprocedural states: Secondary | ICD-10-CM | POA: Diagnosis not present

## 2014-11-15 DIAGNOSIS — I6523 Occlusion and stenosis of bilateral carotid arteries: Secondary | ICD-10-CM | POA: Insufficient documentation

## 2014-11-15 DIAGNOSIS — Z48812 Encounter for surgical aftercare following surgery on the circulatory system: Secondary | ICD-10-CM

## 2014-11-15 DIAGNOSIS — Z87891 Personal history of nicotine dependence: Secondary | ICD-10-CM

## 2014-11-15 NOTE — Patient Instructions (Addendum)
Stroke Prevention Some medical conditions and behaviors are associated with an increased chance of having a stroke. You may prevent a stroke by making healthy choices and managing medical conditions. HOW CAN I REDUCE MY RISK OF HAVING A STROKE?   Stay physically active. Get at least 30 minutes of activity on most or all days.  Do not smoke. It may also be helpful to avoid exposure to secondhand smoke.  Limit alcohol use. Moderate alcohol use is considered to be:  No more than 2 drinks per day for men.  No more than 1 drink per day for nonpregnant women.  Eat healthy foods. This involves:  Eating 5 or more servings of fruits and vegetables a day.  Making dietary changes that address high blood pressure (hypertension), high cholesterol, diabetes, or obesity.  Manage your cholesterol levels.  Making food choices that are high in fiber and low in saturated fat, trans fat, and cholesterol may control cholesterol levels.  Take any prescribed medicines to control cholesterol as directed by your health care provider.  Manage your diabetes.  Controlling your carbohydrate and sugar intake is recommended to manage diabetes.  Take any prescribed medicines to control diabetes as directed by your health care provider.  Control your hypertension.  Making food choices that are low in salt (sodium), saturated fat, trans fat, and cholesterol is recommended to manage hypertension.  Take any prescribed medicines to control hypertension as directed by your health care provider.  Maintain a healthy weight.  Reducing calorie intake and making food choices that are low in sodium, saturated fat, trans fat, and cholesterol are recommended to manage weight.  Stop drug abuse.  Avoid taking birth control pills.  Talk to your health care provider about the risks of taking birth control pills if you are over 35 years old, smoke, get migraines, or have ever had a blood clot.  Get evaluated for sleep  disorders (sleep apnea).  Talk to your health care provider about getting a sleep evaluation if you snore a lot or have excessive sleepiness.  Take medicines only as directed by your health care provider.  For some people, aspirin or blood thinners (anticoagulants) are helpful in reducing the risk of forming abnormal blood clots that can lead to stroke. If you have the irregular heart rhythm of atrial fibrillation, you should be on a blood thinner unless there is a good reason you cannot take them.  Understand all your medicine instructions.  Make sure that other conditions (such as anemia or atherosclerosis) are addressed. SEEK IMMEDIATE MEDICAL CARE IF:   You have sudden weakness or numbness of the face, arm, or leg, especially on one side of the body.  Your face or eyelid droops to one side.  You have sudden confusion.  You have trouble speaking (aphasia) or understanding.  You have sudden trouble seeing in one or both eyes.  You have sudden trouble walking.  You have dizziness.  You have a loss of balance or coordination.  You have a sudden, severe headache with no known cause.  You have new chest pain or an irregular heartbeat. Any of these symptoms may represent a serious problem that is an emergency. Do not wait to see if the symptoms will go away. Get medical help at once. Call your local emergency services (911 in [place] Do not drive yourself to the hospital. Document Released: 05/16/2004 Document Revised: 08/23/2013 Document Reviewed: 10/09/2012 Exit[employer] Patient Information 2015 [employer]. This information is not intended to replace advice given   to you by your health care provider. Make sure you discuss any questions you have with your health care provider.  

## 2014-11-15 NOTE — Progress Notes (Signed)
Established Carotid Patient   History of Present Illness  Calvin [name] is a 81 y.o. male patient of Dr. Kellie Simmering who is status post a right CEA in March 2007. The patient reports having had a TIA resulting in some left-sided paresthesias lasting less than 24 hours. The patient was hospitalized in January 2013 for this for 2 days. The patient and his wife state they are following up with [facility] regarding any symptoms or neural deficit from that TIA. The patient states he is recovered "fully", and has had no further TIA activity. He returns today for follow up.  Wife reports he was hospitalized in May, 2015, at [facility], for elevated CO2.  Started Plavix in 2013, stopped after Xarelto started.  His atrial flutter has returned per his wife.  Pt Diabetic: "borderline" Pt smoker: former smoker, quit in 1999  Pt meds include: Statin : No, statins cause myalgias and arthralgias ASA: no Other anticoagulants/antiplatelets: Xarelto for atrial flutter  Past Medical History  Diagnosis Date  . Essential hypertension   . Hypertriglyceridemia   . CAD (coronary artery disease)     a. 06/2005 s/p CABG x1 (VG->RCA) @ time of myxoma resection.  . 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  . Carotid artery disease     a. 06/2005 s/p R CEA;  b. 10/2013 Carotid U/S: patent RICA, LICA 40-98%.  Marland Kitchen Hearing loss   . COPD (chronic obstructive pulmonary disease)   . Atrial flutter with rapid ventricular response     a. 2007 s/p DCCV following CV surgery;  b. 10/2014->CHA2DS2VASc = 5-->Xarelto.  . Squamous cell lung cancer     a. s/p L pneumonectomy in 2000.  . Type II diabetes mellitus   . Scarlet fever     a. ~ 1942.  Marland Kitchen History of pneumonia   . GERD (gastroesophageal reflux disease)   . Daily headache   . Arthritis   . Depression   . On home oxygen therapy     "2L; 24/7" (11/03/2014)  . Stroke     Social History History  Substance Use Topics  . Smoking status: Former Smoker --  2.00 packs/day for 43 years    Types: Cigarettes    Quit date: 07/21/1997  . Smokeless tobacco: Never Used  . Alcohol Use: 0.0 oz/week    0 Standard drinks or equivalent per week     Comment: 11/03/2014 "never drank much; stopped in the early 1970's"    Family History Family History  Problem Relation Age of Onset  . Heart disease Mother     NOT before age 79  . Heart attack Mother   . Diabetes Mother   . Hyperlipidemia Mother   . Heart disease Sister   . Cancer Sister     Breast  . Hypertension Sister   . Stomach cancer Father   . Cancer Father     Stomach  . Hyperlipidemia Father   . Hyperlipidemia Brother   . Heart disease Brother     Surgical History Past Surgical History  Procedure Laterality Date  . Coronary artery bypass graft  07/12/2005    Dr Servando Snare, CABG x 1 with reverse saphenous vein graft to distal rt coronary artery and excision of left atrial myxoma [Other]  . Pneumonectomy Left 05/03/1998    Dr Servando Snare  . Combined mediastinoscopy and bronchoscopy  05/01/1998    Dr Servando Snare  . Carotid endarterectomy Right 2007  . Cardiac catheterization  06/2005; 01/2010    /  notes 10/06/2010; Archie Endo 02/02/2010  . Transesophageal echocardiogram  06/2005    Archie Endo 09/04/2010  . Cardioversion  07/2005    DCC/notes 09/04/2010  . Renal angiogram Bilateral 12/2005    Archie Endo 09/04/2010    Allergies  Allergen Reactions  . Statins Other (See Comments)    Muscle aches  . Sulfa Antibiotics   . Amoxicillin Rash    Current Outpatient Prescriptions  Medication Sig Dispense Refill  . acetaminophen (TYLENOL) 325 MG tablet Take 325 mg by mouth every 6 (six) hours as needed for mild pain, moderate pain or headache. For pain    . albuterol (PROVENTIL HFA;VENTOLIN HFA) 108 (90 BASE) MCG/ACT inhaler Inhale 2 puffs into the lungs every 6 (six) hours as needed for wheezing.    . carvedilol (COREG) 12.5 MG tablet Take 1 tablet (12.5 mg total) by mouth 2 (two) times daily with a meal. 60  tablet 6  . cetirizine (ZYRTEC) 10 MG tablet Take 10 mg by mouth daily as needed for allergies or rhinitis. For allergies    . cholecalciferol (VITAMIN D) 1000 UNITS tablet Take 2,000 Units by mouth 2 (two) times daily.     Marland Kitchen diltiazem (CARDIZEM CD) 180 MG 24 hr capsule Take 1 capsule (180 mg total) by mouth daily. 30 capsule 0  . EPIPEN 2-PAK 0.3 MG/0.3ML DEVI Inject 0.3 mg into the skin See admin instructions. Take as needed for severe allergic reaction    . ipratropium-albuterol (DUONEB) 0.5-2.5 (3) MG/3ML SOLN Take 3 mLs by nebulization 3 (three) times daily. 540 mL 1  . losartan-hydrochlorothiazide (HYZAAR) 100-25 MG per tablet Take 1 tablet by mouth daily. 90 tablet 3  . magnesium oxide (MAG-OX) 400 MG tablet Take 400 mg by mouth at bedtime.     . metFORMIN (GLUCOPHAGE) 500 MG tablet Take 500 mg by mouth daily with breakfast.     . Multiple Vitamins-Minerals (MULTIVITAMINS THER. W/MINERALS) TABS Take 1 tablet by mouth daily.    . Omega-3 Fatty Acids (FISH OIL) 1200 MG CAPS Take 1 capsule by mouth 2 (two) times daily.    . ranitidine (ZANTAC) 150 MG capsule Take 150 mg by mouth at bedtime as needed for heartburn.     . rivaroxaban (XARELTO) 20 MG TABS tablet Take 1 tablet (20 mg total) by mouth daily with supper. 30 tablet 0   No current facility-administered medications for this visit.    Review of Systems : See HPI for pertinent positives and negatives.  Physical Examination  Filed Vitals:   11/15/14 1136 11/15/14 1139  BP: 136/69 136/75  Pulse: 61   Temp: 98.2 F (36.8 C)   TempSrc: Oral   Height: '5\' 10"'$  (1.778 m)   Weight: 218 lb (98.884 kg)   SpO2: 96%    Body mass index is 31.28 kg/(m^2).  General: WDWN obese male in NAD GAIT: normal Eyes: PERRLA Pulmonary: Non-labored, CTAB, little or no air movement left posterior fields, Negative Rales, Negative rhonchi, & Negative wheezing. He is using his portable supplemental O2/1.5 L [place]. from home  Cardiac: Irregular Rhythm,  no detected murmur.  VASCULAR EXAM Carotid Bruits Left Right   Negative Negative   Radial pulses are 2+ palpable and equal.     Gastrointestinal: soft, nontender, BS WNL, no r/g,no palpable masses.  Musculoskeletal: Negative muscle atrophy/wasting. M/S 5/5 throughout, Extremities without ischemic changes.  Neurologic: A&O X 3; Appropriate Affect,  Speech is loud CN 2-12 intact except is hard of hearing, Pain and light touch intact in extremities, Motor exam  as listed above.         Non-Invasive Vascular Imaging CAROTID DUPLEX 11/15/2014   CEREBROVASCULAR DUPLEX EVALUATION    INDICATION: Carotid stenosis    PREVIOUS INTERVENTION(S): Right carotid endarterectomy performed 07/12/2005.    DUPLEX EXAM:     RIGHT  LEFT  Peak Systolic Velocities (cm/s) End Diastolic Velocities (cm/s) Plaque LOCATION Peak Systolic Velocities (cm/s) End Diastolic Velocities (cm/s) Plaque  219 18  CCA PROXIMAL 128 29   90 11  CCA MID 121 23   313 55 HT CCA DISTAL 95 16 CP  157 13  ECA 234 19 CP  49 13  ICA PROXIMAL 261 67 HT  47 16  ICA MID 125 32   44 12  ICA DISTAL 87 21     carotid endarterectomy ICA / CCA Ratio (PSV) 2.16  Antegrade Vertebral Flow Antegrade  092 Brachial Systolic Pressure (mmHg) 330  Triphasic Brachial Artery Waveforms Triphasic    Plaque Morphology:  HM = Homogeneous, HT = Heterogeneous, CP = Calcific Plaque, SP = Smooth Plaque, IP = Irregular Plaque     ADDITIONAL FINDINGS: Right subclavian artery PSV196cm/sec; Left subclavian artery PSV 282cm/sec    IMPRESSION: Right internal carotid artery stenosis present in the 40%-59% range at the proximal patch with heterogeneous plaque present. Left internal carotid artery stenosis present in the 60%-79% range. Left external carotid artery stenosis present.    Compared to the previous exam:   Increase in disease bilaterally since previous study on 11/09/2013.      Assessment: Calvin [name] is a 81 y.o. male who is status post right CEA in March 2007. He had a TIA in 2013 with no further TIA activity since then. Carotid Duplex today demonstrates 40%-59% right ICA stenosis at the proximal patch with heterogeneous plaque and 60%-79% left ICA stenosis.  Increase in disease bilaterally since previous study on 11/09/2013. He is on Xarelto for atrial flutter.    Plan: Follow-up in 6 months with Carotid Duplex.   I discussed in depth with the patient the nature of atherosclerosis, and emphasized the importance of maximal medical management including strict control of blood pressure, blood glucose, and lipid levels, obtaining regular exercise, and continued cessation of smoking.  The patient is aware that without maximal medical management the underlying atherosclerotic disease process will progress, limiting the benefit of any interventions. The patient was given information about stroke prevention and what symptoms should prompt the patient to seek immediate medical care. Thank you for allowing [place] to participate in this patient's care.  Clemon Chambers, RN, MSN, FNP-C [facility] Office: [phone]  Clinic Physician: Kellie Simmering  11/15/2014 11:49 AM

## 2014-11-16 ENCOUNTER — Other Ambulatory Visit: Payer: Self-pay | Admitting: *Deleted

## 2014-11-16 MED ORDER — CARVEDILOL 12.5 MG PO TABS: 12.5000 mg | ORAL_TABLET | Freq: Two times a day (BID) | ORAL | Status: DC

## 2014-11-18 ENCOUNTER — Telehealth: Payer: Self-pay | Admitting: Internal Medicine

## 2014-11-18 NOTE — Telephone Encounter (Signed)
ATC, NA and unable to leave msg [facility]

## 2014-11-21 NOTE — Telephone Encounter (Signed)
atc X2, line busy.  wcb

## 2014-11-23 NOTE — Telephone Encounter (Signed)
Does not need Rx for Albuterol at this time- will call when needed.  Requests follow up appt with MR in Oct -- explained that recall is in system and they will receive a letter when it is time to schedule appt.  Nothing further needed.

## 2014-11-24 ENCOUNTER — Ambulatory Visit (INDEPENDENT_AMBULATORY_CARE_PROVIDER_SITE_OTHER): Payer: Medicare Other | Admitting: Nurse Practitioner

## 2014-11-24 ENCOUNTER — Encounter: Payer: Self-pay | Admitting: Nurse Practitioner

## 2014-11-24 VITALS — BP 128/58 | HR 69 | Ht 70.0 in | Wt 218.0 lb

## 2014-11-24 DIAGNOSIS — I10 Essential (primary) hypertension: Secondary | ICD-10-CM | POA: Diagnosis not present

## 2014-11-24 DIAGNOSIS — I2510 Atherosclerotic heart disease of native coronary artery without angina pectoris: Secondary | ICD-10-CM

## 2014-11-24 DIAGNOSIS — I4892 Unspecified atrial flutter: Secondary | ICD-10-CM | POA: Diagnosis not present

## 2014-11-24 DIAGNOSIS — I6523 Occlusion and stenosis of bilateral carotid arteries: Secondary | ICD-10-CM

## 2014-11-24 NOTE — Progress Notes (Signed)
Patient Name: Calvin [name] Date of Encounter: 11/24/2014  Primary Care Provider:   [name], MD Primary Cardiologist:  Lendell Caprice, MD   Chief Complaint  81 y/o male who presents for return office visit related to atrial flutter.  Past Medical History   Past Medical History  Diagnosis Date  . Essential hypertension   . Hypertriglyceridemia   . CAD (coronary artery disease)     a. 06/2005 s/p CABG x1 (VG->RCA) @ time of myxoma resection.  . 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  . Carotid artery disease     a. 06/2005 s/p R CEA;  b. 10/2013 Carotid U/S: patent RICA, LICA 75-10%;  c. 05/5850 U/S: RICA 77-82%, LICA 42-35%.  . Hearing loss   . COPD (chronic obstructive pulmonary disease)   . Atrial flutter with rapid ventricular response     a. 2007 s/p DCCV following CV surgery;  b. 10/2014->CHA2DS2VASc = 5-->Xarelto.  . Squamous cell lung cancer     a. s/p L pneumonectomy in 2000.  . Type II diabetes mellitus   . Scarlet fever     a. ~ 1942.  Marland Kitchen History of pneumonia   . GERD (gastroesophageal reflux disease)   . Daily headache   . Arthritis   . Depression   . On home oxygen therapy     "2L; 24/7" (11/03/2014)  . Stroke    Past Surgical History  Procedure Laterality Date  . Coronary artery bypass graft  07/12/2005    Dr Servando Snare, CABG x 1 with reverse saphenous vein graft to distal rt coronary artery and excision of left atrial myxoma [Other]  . Pneumonectomy Left 05/03/1998    Dr Servando Snare  . Combined mediastinoscopy and bronchoscopy  05/01/1998    Dr Servando Snare  . Carotid endarterectomy Right 2007  . Cardiac catheterization  06/2005; 01/2010    Archie Endo 10/06/2010; Archie Endo 02/02/2010  . Transesophageal echocardiogram  06/2005    Archie Endo 09/04/2010  . Cardioversion  07/2005    DCC/notes 09/04/2010  . Renal angiogram Bilateral 12/2005    Archie Endo 09/04/2010    Allergies  Allergies  Allergen Reactions  . Statins Other (See Comments)    Muscle aches  . Sulfa  Antibiotics   . Amoxicillin Rash    HPI  81 year old male with the above complex problem list. [facility] was [facility] secondary to atrial flutter with rapid ventricular response. He was advised to begin Xarelto and he was switched from by bystolic to [place]. He did have improved rate control and was able to be discharged from the emergency department. Unfortunately, he was not able to obtain his xarelto secondary to a need for prior authorization. He returned to [facility] on July 14 with ongoing tachycardia, left shoulder and chest pain. EKG showed 2-1 atrial flutter. Troponin was normal. He was placed on IV diltiazem with improved rate control. This was switched to by mouth diltiazem. Xarelto was initiated and he was subsequently discharged in rate-controlled atrial flutter.  I saw him in f/u on 7/20 @ which time his rate was reasonable.  I discussed his case with Dr. Lovena Le and also discussed mgmt w/ pt.  We mutually agreed that ongoing anticoagulation with a plan for DCCV after 4 wks of anticoagulation was appropriate unless he became unstable/more symptomatic, @ which point, we could pursue TEE/DCCV if necessary.  About 3 days after our last visit, he noticed improvement in heart rate back into the 50s  and 60s. He is in sinus rhythm today. He has chronic stable dyspnea on exertion and uses home O2. He has not had any chest pain and denies palpitations. He denies PND, orthopnea, dizziness, syncope, or early satiety. He does occasionally have lower extremity edema just above his sock line.  Home Medications  Prior to Admission medications   Medication Sig Start Date End Date Taking? Authorizing Provider  acetaminophen (TYLENOL) 325 MG tablet Take 325 mg by mouth every 6 (six) hours as needed for mild pain, moderate pain or headache. For pain    Historical Provider, MD  albuterol (PROVENTIL HFA;VENTOLIN HFA) 108 (90 BASE) MCG/ACT inhaler Inhale 2 puffs into the lungs every 6  (six) hours as needed for wheezing.    Historical Provider, MD  carvedilol (COREG) 12.5 MG tablet Take 1 tablet (12.5 mg total) by mouth 2 (two) times daily with a meal. 11/16/14   Jettie Booze, MD  cetirizine (ZYRTEC) 10 MG tablet Take 10 mg by mouth daily as needed for allergies or rhinitis. For allergies    Historical Provider, MD  cholecalciferol (VITAMIN D) 1000 UNITS tablet Take 2,000 Units by mouth 2 (two) times daily.     Historical Provider, MD  diltiazem (CARDIZEM CD) 180 MG 24 hr capsule Take 1 capsule (180 mg total) by mouth daily. 11/04/14   Luke K Kilroy, PA-C  EPIPEN 2-PAK 0.3 MG/0.3ML DEVI Inject 0.3 mg into the skin See admin instructions. Take as needed for severe allergic reaction 11/04/11   Historical Provider, MD  ipratropium-albuterol (DUONEB) 0.5-2.5 (3) MG/3ML SOLN Take 3 mLs by nebulization 3 (three) times daily. 10/25/14   Brand Males, MD  losartan-hydrochlorothiazide (HYZAAR) 100-25 MG per tablet Take 1 tablet by mouth daily. 01/10/14   Jettie Booze, MD  magnesium oxide (MAG-OX) 400 MG tablet Take 400 mg by mouth at bedtime.     Historical Provider, MD  metFORMIN (GLUCOPHAGE) 500 MG tablet Take 500 mg by mouth daily with breakfast.  03/24/14   Historical Provider, MD  Multiple Vitamins-Minerals (MULTIVITAMINS THER. W/MINERALS) TABS Take 1 tablet by mouth daily.    Historical Provider, MD  Omega-3 Fatty Acids (FISH OIL) 1200 MG CAPS Take 1 capsule by mouth 2 (two) times daily.    Historical Provider, MD  ranitidine (ZANTAC) 150 MG capsule Take 150 mg by mouth at bedtime as needed for heartburn.     Historical Provider, MD  rivaroxaban (XARELTO) 20 MG TABS tablet Take 1 tablet (20 mg total) by mouth daily with supper. 11/04/14   Erlene Quan, PA-C   Review of Systems  General:  No chills, fever, night sweats or weight changes.  Cardiovascular:  No chest pain,  positive chronic dyspnea on exertion,  mild ankle edema, orthopnea, no palpitations, paroxysmal  nocturnal dyspnea. Dermatological: No rash, lesions/masses Respiratory: No cough,  positive, chronic dyspnea-on O2  Urologic: No hematuria, dysuria Abdominal:   No nausea, vomiting, diarrhea, bright red blood per rectum, melena, or hematemesis Neurologic:  No visual changes, wkns, changes in mental status. All other systems reviewed and are otherwise negative except as noted above.  Physical Exam  VS:  BP 128/58 mmHg  Pulse 69  Ht '5\' 10"'$  (1.778 m)  Wt 218 lb (98.884 kg)  BMI 31.28 kg/m2 , BMI Body mass index is 31.28 kg/(m^2). GEN: Well nourished, well developed, in no acute distress. HEENT: normal. Neck: Supple, no JVD, carotid bruits, or masses. Cardiac: RRR, no murmurs, rubs, or gallops. No clubbing, cyanosis, trace bilateral ankle edema  above the sock line. Radials/DP/PT 2+ and equal bilaterally.  Respiratory:  Respirations regular and unlabored,  diminished breath sounds halfway up on the left, clear to auscultation on the right. GI: Soft, nontender, nondistended, BS + x 4. MS: no deformity or atrophy. Skin: warm and dry, no rash. Neuro:  Strength and sensation are intact. Psych: Normal affect.  Accessory Clinical Findings  ECG - regular sinus rhythm, 69, inferior Q waves, no acute ST or T changes.  Assessment & Plan  1.  Paroxysmal Atrial Flutter: Patient returns today for evaluation and is in sinus rhythm. He says that he started feeling better about 3 days after our last visit. Rates at home have been in the 50s to 94s and he has not had any recurrence of palpitations over the past 2 weeks or so. He remains on diltiazem and beta blocker therapy and is anticoagulated with Xarelto. As he has now shown that his atrial flutter is paroxysmal, if he were to have a recurrence, I would refer to electrophysiology for catheter ablation as opposed to pursuing cardioversion.  2.  CAD:  Status post CABG 1 at time of left atrial myxoma resection in 2007. He did have chest pain with rapid  rates prior to recent hospitalization without objective evidence of ischemia. We could consider stress testing if he has recurrence of chest discomfort. Continue beta blocker therapy. He is intolerant to statins.  3.  Essential hypertension: Stable.  4. Hyperlipidemia: Intolerant to statins.  5. Carotid vascular disease: Stable findings on u/s last week. This is followed by vascular surgery.  6. Type 2 diabetes mellitus: This is managed by primary care and he remains on metformin.   7.  Disposition: Follow-up with Dr. Irish Lack in 3 mos or sooner if necessary.  Murray Hodgkins, NP 11/24/2014, 2:45 PM

## 2014-11-24 NOTE — Patient Instructions (Addendum)
Medication Instructions:   Your physician recommends that you continue on your current medications as directed. Please refer to the Current Medication list given to you today.    Labwork:  NONE ORDER TODAY\  Testing/Procedures:  NONE ORDER TODAY   Follow-Up:  WITH DR Irish Lack IN SEPTEMBER   Any Other Special Instructions Will Be Listed Below (If Applicable).

## 2014-11-24 NOTE — Addendum Note (Signed)
Addended by: Freada Bergeron on: 11/24/2014 04:27 PM   Modules accepted: Orders

## 2015-01-11 ENCOUNTER — Encounter: Payer: Self-pay | Admitting: Interventional Cardiology

## 2015-01-11 ENCOUNTER — Ambulatory Visit (INDEPENDENT_AMBULATORY_CARE_PROVIDER_SITE_OTHER): Payer: Medicare Other | Admitting: Interventional Cardiology

## 2015-01-11 VITALS — BP 120/62 | HR 65 | Ht 70.0 in | Wt 219.0 lb

## 2015-01-11 DIAGNOSIS — I4892 Unspecified atrial flutter: Secondary | ICD-10-CM

## 2015-01-11 DIAGNOSIS — I6523 Occlusion and stenosis of bilateral carotid arteries: Secondary | ICD-10-CM | POA: Diagnosis not present

## 2015-01-11 DIAGNOSIS — I779 Disorder of arteries and arterioles, unspecified: Secondary | ICD-10-CM

## 2015-01-11 DIAGNOSIS — E785 Hyperlipidemia, unspecified: Secondary | ICD-10-CM | POA: Diagnosis not present

## 2015-01-11 DIAGNOSIS — I2510 Atherosclerotic heart disease of native coronary artery without angina pectoris: Secondary | ICD-10-CM

## 2015-01-11 DIAGNOSIS — I739 Peripheral vascular disease, unspecified: Secondary | ICD-10-CM

## 2015-01-11 MED ORDER — PRAVASTATIN SODIUM 20 MG PO TABS: 20.0000 mg | ORAL_TABLET | Freq: Every day | ORAL | Status: DC

## 2015-01-11 MED ORDER — DILTIAZEM HCL ER COATED BEADS 180 MG PO CP24: 180.0000 mg | ORAL_CAPSULE | Freq: Every day | ORAL | Status: DC

## 2015-01-11 NOTE — Patient Instructions (Addendum)
.  Medication Instructions:   START TAKING PRAVASTATIN  20 MG ONCE A DAY    Labwork:  NONE ORDER TODAY    Testing/Procedures:  NONE ORDER TODAY    Follow-Up:  Your physician wants you to follow-up in: [place] will receive a reminder letter in the mail two months in advance. If you don't receive a letter, please call our office to schedule the follow-up appointment.     Any Other Special Instructions Will Be Listed Below (If Applicable).  Dr. Beau Fanny recommends if having palpitations you may take an extra cardizem on if needed   Please contact office if you have any concerns

## 2015-01-11 NOTE — Progress Notes (Signed)
Patient ID: Calvin [name], male   DOB: 17-Oct-1932, 81 y.o.   MRN: [id number]     Cardiology Office Note   Date:  01/11/2015   ID:  Jonanthan, Bolender [birth year], MRN [id number]  PCP:   [name], MD    No chief complaint on file. f/u CAD, myxoma   Wt Readings from Last 3 Encounters:  01/11/15 219 lb (99.338 kg)  11/24/14 218 lb (98.884 kg)  11/15/14 218 lb (98.884 kg)       History of Present Illness: Calvin [name] is a 81 y.o. male  who has had CAD , COPD,and a intracardiac tumor which was removed. Still has some SOB with exertion. BP at home is in the 323/55 range systolic. Now on home oxygen. CAD/ASCVD:  c/o Dyspnea on exertion stable, now on oxygen all of the time; s/p left lung resection for cancer in 2000..  c/o Leg edema minimal.  Denies : Chest pain.  Nitroglycerin.  Palpitations.  Paroxysmal nocturnal dyspnea.  Syncope.  walking is limited by breathing.    Past Medical History  Diagnosis Date  . Essential hypertension   . Hypertriglyceridemia   . CAD (coronary artery disease)     a. 06/2005 s/p CABG x1 (VG->RCA) @ time of myxoma resection.  . 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  . Carotid artery disease     a. 06/2005 s/p R CEA;  b. 10/2013 Carotid U/S: patent RICA, LICA 73-22%;  c. 0/2542 U/S: RICA 70-62%, LICA 37-62%.  . Hearing loss   . COPD (chronic obstructive pulmonary disease)   . Atrial flutter with rapid ventricular response     a. 2007 s/p DCCV following CV surgery;  b. 10/2014->CHA2DS2VASc = 5-->Xarelto.  . Squamous cell lung cancer     a. s/p L pneumonectomy in 2000.  . Type II diabetes mellitus   . Scarlet fever     a. ~ 1942.  Marland Kitchen History of pneumonia   . GERD (gastroesophageal reflux disease)   . Daily headache   . Arthritis   . Depression   . On home oxygen therapy     "2L; 24/7" (11/03/2014)  . Stroke     Past Surgical History  Procedure Laterality Date  . Coronary artery bypass graft  07/12/2005    Dr Servando Snare, CABG x 1  with reverse saphenous vein graft to distal rt coronary artery and excision of left atrial myxoma [Other]  . Pneumonectomy Left 05/03/1998    Dr Servando Snare  . Combined mediastinoscopy and bronchoscopy  05/01/1998    Dr Servando Snare  . Carotid endarterectomy Right 2007  . Cardiac catheterization  06/2005; 01/2010    Archie Endo 10/06/2010; Archie Endo 02/02/2010  . Transesophageal echocardiogram  06/2005    Archie Endo 09/04/2010  . Cardioversion  07/2005    DCC/notes 09/04/2010  . Renal angiogram Bilateral 12/2005    Archie Endo 09/04/2010     Current Outpatient Prescriptions  Medication Sig Dispense Refill  . acetaminophen (TYLENOL) 325 MG tablet Take 325 mg by mouth every 6 (six) hours as needed for mild pain, moderate pain or headache. For pain    . albuterol (PROVENTIL HFA;VENTOLIN HFA) 108 (90 BASE) MCG/ACT inhaler Inhale 2 puffs into the lungs every 6 (six) hours as needed for wheezing.    . carvedilol (COREG) 12.5 MG tablet Take 1 tablet (12.5 mg total) by mouth 2 (two) times daily with a meal. 180 tablet 3  . cetirizine (ZYRTEC) 10 MG tablet Take 10  mg by mouth daily as needed for allergies or rhinitis. For allergies    . cholecalciferol (VITAMIN D) 1000 UNITS tablet Take 2,000 Units by mouth 2 (two) times daily.     Marland Kitchen diltiazem (CARDIZEM CD) 180 MG 24 hr capsule Take 1 capsule (180 mg total) by mouth daily. 30 capsule 0  . EPIPEN 2-PAK 0.3 MG/0.3ML DEVI Inject 0.3 mg into the skin See admin instructions. Take as needed for severe allergic reaction    . ipratropium-albuterol (DUONEB) 0.5-2.5 (3) MG/3ML SOLN Take 3 mLs by nebulization 3 (three) times daily. 540 mL 1  . losartan-hydrochlorothiazide (HYZAAR) 100-25 MG per tablet Take 1 tablet by mouth daily. 90 tablet 3  . magnesium oxide (MAG-OX) 400 MG tablet Take 400 mg by mouth at bedtime.     . metFORMIN (GLUCOPHAGE) 500 MG tablet Take 500 mg by mouth daily with breakfast.     . Multiple Vitamins-Minerals (MULTIVITAMINS THER. W/MINERALS) TABS Take 1 tablet by  mouth daily.    . Omega-3 Fatty Acids (FISH OIL) 1200 MG CAPS Take 1 capsule by mouth 2 (two) times daily.    . ranitidine (ZANTAC) 150 MG capsule Take 150 mg by mouth at bedtime as needed for heartburn.     . rivaroxaban (XARELTO) 20 MG TABS tablet Take 1 tablet (20 mg total) by mouth daily with supper. 30 tablet 0   No current facility-administered medications for this visit.    Allergies:   Statins; Sulfa antibiotics; and Amoxicillin    Social History:  The patient  reports that he quit smoking about 17 years ago. His smoking use included Cigarettes. He has a 86 pack-year smoking history. He has never used smokeless tobacco. He reports that he drinks alcohol. He reports that he does not use illicit drugs.   Family History:  The patient's family history includes Cancer in his father and sister; Diabetes in his mother; Heart attack in his mother; Heart disease in his brother, mother, and sister; Hyperlipidemia in his brother, father, and mother; Hypertension in his sister; Stomach cancer in his father.    ROS:  Please see the history of present illness.   Otherwise, review of systems are positive for DOE-chronic.   All other systems are reviewed and negative.    PHYSICAL EXAM: VS:  BP 120/62 mmHg  Pulse 65  Ht '5\' 10"'$  (1.778 m)  Wt 219 lb (99.338 kg)  BMI 31.42 kg/m2  SpO2 92% , BMI Body mass index is 31.42 kg/(m^2). GEN: Well nourished, well developed, in no acute distress HEENT: normal Neck: no JVD, carotid bruits, or masses Cardiac: RRR; no murmurs, rubs, or gallops,no edema  Respiratory:  clear to auscultation bilaterally, normal work of breathing GI: soft, nontender, nondistended, + BS MS: no deformity or atrophy Skin: warm and dry, no rash Neuro:  Strength and sensation are intact Psych: euthymic mood, full affect   EKG:   The ekg ordered in July demonstrates atrial flutter with RVR   Recent Labs: 11/03/2014: Hemoglobin 12.5*; Magnesium 2.2; Platelets 212; TSH  1.609 11/04/2014: BUN 21*; Creatinine, Ser 1.40*; Potassium 3.7; Sodium 138   Lipid Panel    Component Value Date/Time   CHOL 238* 05/18/2011 0500   TRIG 291* 05/18/2011 0500   HDL 36* 05/18/2011 0500   CHOLHDL 6.6 05/18/2011 0500   VLDL 58* 05/18/2011 0500   LDLCALC 144* 05/18/2011 0500     Other studies Reviewed: Additional studies/ records that were reviewed today with results demonstrating: atrial flutter.   ASSESSMENT AND  PLAN:  1. Coronary artery disease: Status post one-vessel bypass at the time of his cardiac surgery to remove the intracardiac tumor. No angina at this time. 2. Hyperlipidemia: start pravastatin 20 mg daily. If he cannot tolerate this, would decrease it to 10 mg daily.  We talked about PCS K9 inhibitor. He is not interested in an injectable cholesterol-lowering medicine.  Labs have been followed with his primary care doctor, Dr. Harrington Challenger. 3. Atrial flutter. OK to take an extra cardizem tablet if needed.  Tolerating anticoagulation for stroke prevention. No bleeding issues. He has had some prolonged tachycardia at home which is detected by his pulse oximetry monitor. 4. Carotid artery disease: We talked about aggressive secondary prevention. He has been intolerant of his statins. He is willing to try the low-dose pravastatin. Followed at [facility].   Current medicines are reviewed at length with the patient today.  The patient concerns regarding his medicines were addressed.  The following changes have been made:  Add low-dose pravastatin. He has been intolerant of Lipitor, Zocor, Vytorin, Zetia.  Labs/ tests ordered today include:  No orders of the defined types were placed in this encounter.    Recommend 150 minutes/week of aerobic exercise Low fat, low carb, high fiber diet recommended  Disposition:   FU in 1 year   Teresita Madura., MD  01/11/2015 9:53 AM    [facility] [address], [place], [place]  [address] Phone: [phone]; Fax: [phone]

## 2015-01-13 ENCOUNTER — Other Ambulatory Visit: Payer: Self-pay | Admitting: *Deleted

## 2015-01-13 MED ORDER — LOSARTAN POTASSIUM-HCTZ 100-25 MG PO TABS: 1.0000 | ORAL_TABLET | Freq: Every day | ORAL | Status: DC

## 2015-02-23 ENCOUNTER — Telehealth: Payer: Self-pay | Admitting: Internal Medicine

## 2015-02-23 MED ORDER — IPRATROPIUM-ALBUTEROL 0.5-2.5 (3) MG/3ML IN SOLN: 3.0000 mL | Freq: Three times a day (TID) | RESPIRATORY_TRACT | Status: DC

## 2015-02-23 NOTE — Telephone Encounter (Signed)
Received PA request from [facility] for Ipratropium/Albuterol 527m; Called [phone] PA [place] going through their secondary insurance and it needs to be approved through Medicare. Called Medicare to initiate PA spoke with PTory Emeraldfirst, then had to call back and spoke with BDeveron Furlong Was given 2 telephone numbers, both numbers that [facility] were incorrect numbers. Called Medicare again LJaneice Robinson- She stated that she is not allowed to give me any information, she would need to speak with patient Called provider line at [phone] it says that patient has a policy through a work plan and that he does not have drug coverage plan through Medicare. Called and spoke with patient, he says that [facility] and has not had trouble getting this.  The prescription that was sent on 02/14/15 was for 3 month supply to [facility].  He says that he is low on his medication and needs a refill and requested that we send in 1 month supply.  Patient has appointment with Dr. RChase Calleron Monday and will discuss getting 90 day supply [facility] D[facility]  Patient says that Medicare has been paying for this medication (monthly) for years without any problems. Per patient's request, sent refill of medication to [facility] for 1 month supply.  Patient notified. Nothing further needed. Closing encounter

## 2015-02-27 ENCOUNTER — Ambulatory Visit (INDEPENDENT_AMBULATORY_CARE_PROVIDER_SITE_OTHER): Payer: Medicare Other | Admitting: Internal Medicine

## 2015-02-27 ENCOUNTER — Encounter: Payer: Self-pay | Admitting: Internal Medicine

## 2015-02-27 ENCOUNTER — Telehealth: Payer: Self-pay | Admitting: Internal Medicine

## 2015-02-27 VITALS — BP 110/60 | HR 61 | Ht 70.0 in | Wt 216.0 lb

## 2015-02-27 DIAGNOSIS — J449 Chronic obstructive pulmonary disease, unspecified: Secondary | ICD-10-CM

## 2015-02-27 DIAGNOSIS — J3489 Other specified disorders of nose and nasal sinuses: Secondary | ICD-10-CM

## 2015-02-27 DIAGNOSIS — I6523 Occlusion and stenosis of bilateral carotid arteries: Secondary | ICD-10-CM | POA: Diagnosis not present

## 2015-02-27 DIAGNOSIS — J9612 Chronic respiratory failure with hypercapnia: Secondary | ICD-10-CM | POA: Diagnosis not present

## 2015-02-27 MED ORDER — IPRATROPIUM-ALBUTEROL 0.5-2.5 (3) MG/3ML IN SOLN: 3.0000 mL | Freq: Three times a day (TID) | RESPIRATORY_TRACT | Status: AC

## 2015-02-27 MED ORDER — PRAVASTATIN SODIUM 20 MG PO TABS: 20.0000 mg | ORAL_TABLET | ORAL | Status: DC

## 2015-02-27 NOTE — Telephone Encounter (Signed)
He can change pravastatin 20 mg to every other day.  JV

## 2015-02-27 NOTE — Patient Instructions (Addendum)
ICD-9-CM ICD-10-CM   1. COPD, very severe 496 J44.9   2. Hypercapnic respiratory failure, chronic 518.83 J96.12      - Disease is stable. - You look stronger - Continue using DuoNeb as scheduled  - [facility] and get it through Medicare part B - Continue oxygen as scheduled - ANy breathing issues - always call us anytime [phone] - Continue daily activities and exercise but be careful with her joints  - For postnasal drip  - Stop Zyrtec  -  take generic fluticasone inhaler 2 squirts each nostril daily   Followup 6 months with NP Tammy or myself

## 2015-02-27 NOTE — Telephone Encounter (Signed)
**Note De-Identified Ashlynd Michna Obfuscation** The pts wife is advised and she verbalized understanding. She states that she will notify the pt when he returns home today. Pravastatin dose has been updated in the pts med list.

## 2015-02-27 NOTE — Telephone Encounter (Signed)
Calvin [name]   Is reporting ongoing cramps with the new statin as well. Just FYI  Dr. Brand Males, M.D., [facility].C.P Pulmonary and Critical Care Medicine Staff Physician [facility] Pager: [phone], If no answer or between  15:00h - 7:00h: call [phone]  02/27/2015 10:09 AM

## 2015-02-27 NOTE — Progress Notes (Signed)
Subjective:     Patient ID: Calvin [name], male   DOB: [birth year], 81 y.o.   MRN: [id number]  HPI     Follow-up COPD, history of pneumonectomy on the left with chronic respiratory failure and baseline lung function of FEV1 0.8 L/26% with a ratio 54 very severe obstructive lung disease Gold stage IV COPD.   OV 02/27/2015  Chief Complaint  Patient presents with  . Follow-up    Pt here for 6 month f/u. Pt states his breathing is unchanged since last OV. Pt denies cough and CP/tightness.    Follow-up of advanced COPD with chronic respiratory failure hypoxemic  - Presents with his wife. This is routine six-month follow-up. In the last 6 months his physical activity has increased. He is not interested in going to pulmonary rehabilitation. He and his wife is satisfied with quality of life at home and does physical exercise. He is doing yard work and garage work. In fact he used a tractor without always stating and is now having musculoskeletal pain in his forearm for the last few weeks. No swelling no fever no rash. In terms of COPD stable. They want to change his oxygen from [facility] to [facility]. He is up-to-date with his flu shot per history.   - Only other issue is persistent postnasal drip that they feel is because of oxygen. Zyrtec is not helping. He is not using his Flonase regularly      Review of Systems As per history of present illness    Objective:   Physical Exam  Constitutional: He is oriented to person, place, and time. He appears well-developed and well-nourished. No distress.  Body mass index is 30.99 kg/(m^2).   HENT:  Head: Normocephalic and atraumatic.  Right Ear: External ear normal.  Left Ear: External ear normal.  Mouth/Throat: Oropharynx is clear and moist. No oropharyngeal exudate.  Significant postnasal drip  Eyes: Conjunctivae and EOM are normal. Pupils are equal, round, and reactive to light. Right eye exhibits no discharge. Left eye exhibits no  discharge. No scleral icterus.  Neck: Normal range of motion. Neck supple. No JVD present. No tracheal deviation present. No thyromegaly present.  Cardiovascular: Normal rate, regular rhythm and intact distal pulses.  Exam reveals no gallop and no friction rub.   No murmur heard. Pulmonary/Chest: Effort normal and breath sounds normal. No respiratory distress. He has no wheezes. He has no rales. He exhibits no tenderness.  Barrel chest no wheeze   Abdominal: Soft. Bowel sounds are normal. He exhibits no distension and no mass. There is no tenderness. There is no rebound and no guarding.  Musculoskeletal: Normal range of motion. He exhibits no edema or tenderness.  Lymphadenopathy:    He has no cervical adenopathy.  Neurological: He is alert and oriented to person, place, and time. He has normal reflexes. No cranial nerve deficit. Coordination normal.  Skin: Skin is warm and dry. No rash noted. He is not diaphoretic. No erythema. No pallor.  Psychiatric: He has a normal mood and affect. His behavior is normal. Judgment and thought content normal.  Nursing note and vitals reviewed.   Filed Vitals:   02/27/15 0927  BP: 110/60  Pulse: 61  Height: '5\' 10"'$  (1.778 m)  Weight: 216 lb (97.977 kg)  SpO2: 94%        Assessment:       ICD-9-CM ICD-10-CM   1. COPD, very severe ([place]) 496 J44.9 Ambulatory Referral for DME  2. Hypercapnic respiratory failure, chronic (  [place]) 518.83 J96.12   3. Sinus drainage 478.19 J34.89         Plan:       - Disease is stable. - You look stronger - Continue using DuoNeb as scheduled  - change from [place] to advanced home care and get it through Medicare part B - Continue oxygen as scheduled - ANy breathing issues - always call us anytime [phone] - Continue daily activities and exercise but be careful with her joints   - For postnasal drip  - Stop Zyrtec  -  take generic fluticasone inhaler 2 squirts each nostril daily   Followup 6 months  with NP Tammy or myself  Dr. Brand Males, M.D., [facility].C.P Pulmonary and Critical Care Medicine Staff Physician [place] [facility] Pager: [phone], If no answer or between  15:00h - 7:00h: call [phone]  02/27/2015 10:01 AM

## 2015-05-12 ENCOUNTER — Encounter: Payer: Self-pay | Admitting: Family

## 2015-05-15 ENCOUNTER — Encounter: Payer: Self-pay | Admitting: Family

## 2015-05-23 ENCOUNTER — Encounter: Payer: Self-pay | Admitting: Family

## 2015-05-23 ENCOUNTER — Ambulatory Visit (INDEPENDENT_AMBULATORY_CARE_PROVIDER_SITE_OTHER): Payer: Medicare Other | Admitting: Family

## 2015-05-23 ENCOUNTER — Ambulatory Visit (HOSPITAL_COMMUNITY)
Admission: RE | Admit: 2015-05-23 | Discharge: 2015-05-23 | Disposition: A | Discharge status: Home / Self Care | Payer: Medicare Other | Source: Ambulatory Visit | Attending: Family | Admitting: Family

## 2015-05-23 VITALS — BP 137/67 | HR 64 | Temp 97.8°F | Resp 18 | Ht 71.0 in | Wt 211.0 lb

## 2015-05-23 DIAGNOSIS — Z48812 Encounter for surgical aftercare following surgery on the circulatory system: Secondary | ICD-10-CM

## 2015-05-23 DIAGNOSIS — E781 Pure hyperglyceridemia: Secondary | ICD-10-CM | POA: Insufficient documentation

## 2015-05-23 DIAGNOSIS — Z87891 Personal history of nicotine dependence: Secondary | ICD-10-CM | POA: Diagnosis not present

## 2015-05-23 DIAGNOSIS — Z9889 Other specified postprocedural states: Secondary | ICD-10-CM | POA: Insufficient documentation

## 2015-05-23 DIAGNOSIS — I10 Essential (primary) hypertension: Secondary | ICD-10-CM | POA: Diagnosis not present

## 2015-05-23 DIAGNOSIS — I6523 Occlusion and stenosis of bilateral carotid arteries: Secondary | ICD-10-CM

## 2015-05-23 DIAGNOSIS — E119 Type 2 diabetes mellitus without complications: Secondary | ICD-10-CM | POA: Insufficient documentation

## 2015-05-23 NOTE — Progress Notes (Signed)
Chief Complaint: Extracranial Carotid Artery Stenosis   History of Present Illness  Calvin [name] is a 82 y.o. male patient of Dr. Kellie Simmering who is status post a right CEA in March 2007. The patient reports having had a TIA resulting in some left-sided paresthesias lasting less than 24 hours. The patient was hospitalized in January 2013 for this for 2 days. The patient and his wife state they are following up with [place] regarding any symptoms or neural deficit from that TIA. The patient states he is recovered "fully", and has had no further TIA activity. He returns today for follow up.  Wife reports he was hospitalized in May, 2015, at [facility], for elevated CO2.  Started Plavix in 2013, stopped after Xarelto started.  His atrial flutter has returned per his wife.  Pt Diabetic: "borderline" Pt smoker: former smoker, quit in 1999  Pt meds include: Statin : No, statins cause myalgias and arthralgias ASA: no Other anticoagulants/antiplatelets: Xarelto for atrial flutter   Past Medical History  Diagnosis Date  . Essential hypertension   . Hypertriglyceridemia   . CAD (coronary artery disease)     a. 06/2005 s/p CABG x1 (VG->RCA) @ time of myxoma resection.  . 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  . Carotid artery disease ([place])     a. 06/2005 s/p R CEA;  b. 10/2013 Carotid U/S: patent RICA, LICA 76-28%;  c. 06/1515 U/S: RICA 61-60%, LICA 73-71%.  . Hearing loss   . COPD (chronic obstructive pulmonary disease) ([place])   . Atrial flutter with rapid ventricular response ([place])     a. 2007 s/p DCCV following CV surgery;  b. 10/2014->CHA2DS2VASc = 5-->Xarelto.  . Squamous cell lung cancer ([place])     a. s/p L pneumonectomy in 2000.  . Type II diabetes mellitus ([place])   . Scarlet fever     a. ~ 1942.  Marland Kitchen History of pneumonia   . GERD (gastroesophageal reflux disease)   . Daily headache   . Arthritis   . Depression   . On home oxygen therapy     "2L; 24/7"  (11/03/2014)  . Stroke [facility])     Social History Social History  Substance Use Topics  . Smoking status: Former Smoker -- 2.00 packs/day for 43 years    Types: Cigarettes    Quit date: 07/21/1997  . Smokeless tobacco: Never Used  . Alcohol Use: 0.0 oz/week    0 Standard drinks or equivalent per week     Comment: 11/03/2014 "never drank much; stopped in the early 1970's"    Family History Family History  Problem Relation Age of Onset  . Heart disease Mother     NOT before age 82  . Heart attack Mother   . Diabetes Mother   . Hyperlipidemia Mother   . Heart disease Sister   . Cancer Sister     Breast  . Hypertension Sister   . Stomach cancer Father   . Cancer Father     Stomach  . Hyperlipidemia Father   . Hyperlipidemia Brother   . Heart disease Brother     Surgical History Past Surgical History  Procedure Laterality Date  . Coronary artery bypass graft  07/12/2005    Dr Servando Snare, CABG x 1 with reverse saphenous vein graft to distal rt coronary artery and excision of left atrial myxoma [Other]  . Pneumonectomy Left 05/03/1998    Dr Servando Snare  . Combined mediastinoscopy and bronchoscopy  05/01/1998  Dr Servando Snare  . Carotid endarterectomy Right 2007  . Cardiac catheterization  06/2005; 01/2010    Archie Endo 10/06/2010; Archie Endo 02/02/2010  . Transesophageal echocardiogram  06/2005    Archie Endo 09/04/2010  . Cardioversion  07/2005    DCC/notes 09/04/2010  . Renal angiogram Bilateral 12/2005    Archie Endo 09/04/2010    Allergies  Allergen Reactions  . Statins Other (See Comments)    Muscle aches  . Sulfa Antibiotics   . Amoxicillin Rash    Current Outpatient Prescriptions  Medication Sig Dispense Refill  . acetaminophen (TYLENOL) 325 MG tablet Take 325 mg by mouth every 6 (six) hours as needed for mild pain, moderate pain or headache. For pain    . albuterol (PROVENTIL HFA;VENTOLIN HFA) 108 (90 BASE) MCG/ACT inhaler Inhale 2 puffs into the lungs every 6 (six) hours as needed for  wheezing.    . carvedilol (COREG) 12.5 MG tablet Take 1 tablet (12.5 mg total) by mouth 2 (two) times daily with a meal. 180 tablet 3  . cetirizine (ZYRTEC) 10 MG tablet Take 10 mg by mouth daily as needed for allergies or rhinitis. For allergies    . cholecalciferol (VITAMIN D) 1000 UNITS tablet Take 2,000 Units by mouth 2 (two) times daily.     Marland Kitchen diltiazem (CARDIZEM CD) 180 MG 24 hr capsule Take 1 capsule (180 mg total) by mouth daily. 90 capsule 3  . EPIPEN 2-PAK 0.3 MG/0.3ML DEVI Inject 0.3 mg into the skin See admin instructions. Take as needed for severe allergic reaction    . ipratropium-albuterol (DUONEB) 0.5-2.5 (3) MG/3ML SOLN Take 3 mLs by nebulization 3 (three) times daily. 270 mL 11  . losartan-hydrochlorothiazide (HYZAAR) 100-25 MG per tablet Take 1 tablet by mouth daily. 90 tablet 3  . magnesium oxide (MAG-OX) 400 MG tablet Take 400 mg by mouth at bedtime.     . metFORMIN (GLUCOPHAGE) 500 MG tablet Take 500 mg by mouth daily with breakfast.     . Multiple Vitamins-Minerals (MULTIVITAMINS THER. W/MINERALS) TABS Take 1 tablet by mouth daily.    . Omega-3 Fatty Acids (FISH OIL) 1200 MG CAPS Take 1 capsule by mouth 2 (two) times daily.    . pravastatin (PRAVACHOL) 20 MG tablet Take 1 tablet (20 mg total) by mouth every other day. 45 tablet 1  . ranitidine (ZANTAC) 150 MG capsule Take 150 mg by mouth at bedtime as needed for heartburn.     . rivaroxaban (XARELTO) 20 MG TABS tablet Take 1 tablet (20 mg total) by mouth daily with supper. 30 tablet 0   No current facility-administered medications for this visit.    Review of Systems : See HPI for pertinent positives and negatives.  Physical Examination  Filed Vitals:   05/23/15 1306 05/23/15 1308  BP: 130/71 137/67  Pulse: 62 64  Temp: 97.8 F (36.6 C)   Resp: 18   Height: '5\' 11"'$  (1.803 m)   Weight: 211 lb (95.709 kg)   SpO2: 93%    Body mass index is 29.44 kg/(m^2).  General: WDWN obese male in NAD GAIT: normal Eyes:  PERRLA Pulmonary: Non-labored, little or no air movement in posterior fields, no rales, rhonchi, or wheezing. He is using his portable supplemental O2/1.5 L . from home  Cardiac: Regular rhythm, no detected murmur.  VASCULAR EXAM Carotid Bruits Left Right   Negative Negative   Radial pulses are 2+ palpable and equal.     Gastrointestinal: soft, nontender, BS WNL, no r/g,no palpable masses.  Musculoskeletal: No muscle  atrophy/wasting. M/S 5/5 throughout, Extremities without ischemic changes.  Neurologic: A&O X 3; Appropriate Affect,  Speech is loud CN 2-12 intact except is hard of hearing, Pain and light touch intact in extremities, Motor exam as listed above.                Non-Invasive Vascular Imaging CAROTID DUPLEX 05/23/2015   CEREBROVASCULAR DUPLEX EVALUATION    INDICATION: Carotid artery disease     PREVIOUS INTERVENTION(S): Right carotid endarterectomy 07/12/2005    DUPLEX EXAM:     RIGHT  LEFT  Peak Systolic Velocities (cm/s) End Diastolic Velocities (cm/s) Plaque LOCATION Peak Systolic Velocities (cm/s) End Diastolic Velocities (cm/s) Plaque  104 14  CCA PROXIMAL 124 30   93 13  CCA MID 96 21   384 56 HT CCA DISTAL 93 20 CP  96 13  ECA 219 12 CP  50 11 HT ICA PROXIMAL 277 60 HT  50 13  ICA MID 157 29   38 13  ICA DISTAL 90 25     NA ICA / CCA Ratio (PSV) 2.88  Antegrade  Vertebral Flow Antegrade    Brachial Systolic Pressure (mmHg)   Multiphasic (Subclavian artery) Brachial Artery Waveforms Multiphasic (Subclavian artery)    Plaque Morphology:  HM = Homogeneous, HT = Heterogeneous, CP = Calcific Plaque, SP = Smooth Plaque, IP = Irregular Plaque     ADDITIONAL FINDINGS:     IMPRESSION: Patent right carotid endarterectomy site with evidence of heterogeneous plaque present at the proximal patch; velocities  suggest a 40-59% stenosis. Left internal carotid artery velocities suggest a 40-59% stenosis (high end of range); velocities may be underestimated due to calcified plaque and acoustic shadowing.      Compared to the previous exam:  No significant change in comparison to the last exam on 11/15/2014.      Assessment: Calvin [name] is a 82 y.o. male who is status post right CEA in March 2007. He had a TIA in 2013 with no further TIA activity since then.  Carotid Duplex today suggests  40-59% right ICA  stenosis. Left internal carotid artery velocities suggest a 40-59% stenosis (high end of range); velocities may be underestimated due to calcified plaque.  No significant change in comparison to the last exam on 11/15/2014.    Plan: Follow-up in 1 year instead of 6 months at pt and wife request due to upcoming cataract surgery and several other medical appointments, with Carotid Duplex scan.   I discussed in depth with the patient the nature of atherosclerosis, and emphasized the importance of maximal medical management including strict control of blood pressure, blood glucose, and lipid levels, obtaining regular exercise, and continues cessation of smoking.  The patient is aware that without maximal medical management the underlying atherosclerotic disease process will progress, limiting the benefit of any interventions. The patient was given information about stroke prevention and what symptoms should prompt the patient to seek immediate medical care. Thank you for allowing [place] to participate in this patient's care.  Clemon Chambers, RN, MSN, FNP-C [facility] Office: [phone]  Clinic Physician: Early  05/23/2015 1:13 PM

## 2015-05-23 NOTE — Patient Instructions (Signed)
Stroke Prevention Some medical conditions and behaviors are associated with an increased chance of having a stroke. You may prevent a stroke by making healthy choices and managing medical conditions. HOW CAN I REDUCE MY RISK OF HAVING A STROKE?   Stay physically active. Get at least 30 minutes of activity on most or all days.  Do not smoke. It may also be helpful to avoid exposure to secondhand smoke.  Limit alcohol use. Moderate alcohol use is considered to be:  No more than 2 drinks per day for men.  No more than 1 drink per day for nonpregnant women.  Eat healthy foods. This involves:  Eating 5 or more servings of fruits and vegetables a day.  Making dietary changes that address high blood pressure (hypertension), high cholesterol, diabetes, or obesity.  Manage your cholesterol levels.  Making food choices that are high in fiber and low in saturated fat, trans fat, and cholesterol may control cholesterol levels.  Take any prescribed medicines to control cholesterol as directed by your health care provider.  Manage your diabetes.  Controlling your carbohydrate and sugar intake is recommended to manage diabetes.  Take any prescribed medicines to control diabetes as directed by your health care provider.  Control your hypertension.  Making food choices that are low in salt (sodium), saturated fat, trans fat, and cholesterol is recommended to manage hypertension.  Ask your health care provider if you need treatment to lower your blood pressure. Take any prescribed medicines to control hypertension as directed by your health care provider.  If you are 18-39 years of age, have your blood pressure checked every 3-5 years. If you are 40 years of age or older, have your blood pressure checked every year.  Maintain a healthy weight.  Reducing calorie intake and making food choices that are low in sodium, saturated fat, trans fat, and cholesterol are recommended to manage  weight.  Stop drug abuse.  Avoid taking birth control pills.  Talk to your health care provider about the risks of taking birth control pills if you are over 35 years old, smoke, get migraines, or have ever had a blood clot.  Get evaluated for sleep disorders (sleep apnea).  Talk to your health care provider about getting a sleep evaluation if you snore a lot or have excessive sleepiness.  Take medicines only as directed by your health care provider.  For some people, aspirin or blood thinners (anticoagulants) are helpful in reducing the risk of forming abnormal blood clots that can lead to stroke. If you have the irregular heart rhythm of atrial fibrillation, you should be on a blood thinner unless there is a good reason you cannot take them.  Understand all your medicine instructions.  Make sure that other conditions (such as anemia or atherosclerosis) are addressed. SEEK IMMEDIATE MEDICAL CARE IF:   You have sudden weakness or numbness of the face, arm, or leg, especially on one side of the body.  Your face or eyelid droops to one side.  You have sudden confusion.  You have trouble speaking (aphasia) or understanding.  You have sudden trouble seeing in one or both eyes.  You have sudden trouble walking.  You have dizziness.  You have a loss of balance or coordination.  You have a sudden, severe headache with no known cause.  You have new chest pain or an irregular heartbeat. Any of these symptoms may represent a serious problem that is an emergency. Do not wait to see if the symptoms will   go away. Get medical help at once. Call your local emergency services (911 in [place] Do not drive yourself to the hospital.   This information is not intended to replace advice given to you by your health care provider. Make sure you discuss any questions you have with your health care provider.   Document Released: 05/16/2004 Document Revised: 04/29/2014 Document Reviewed:  10/09/2012 Elsevier Interactive Patient Education 2016 [employer]  

## 2015-05-24 ENCOUNTER — Other Ambulatory Visit: Payer: Self-pay | Admitting: Cardiothoracic Surgery

## 2015-05-24 DIAGNOSIS — I25119 Atherosclerotic heart disease of native coronary artery with unspecified angina pectoris: Secondary | ICD-10-CM

## 2015-05-24 NOTE — Addendum Note (Signed)
Addended by: Thresa Ross C on: 05/24/2015 11:41 AM   Modules accepted: Orders

## 2015-05-25 ENCOUNTER — Ambulatory Visit (INDEPENDENT_AMBULATORY_CARE_PROVIDER_SITE_OTHER): Payer: Medicare Other | Admitting: Cardiothoracic Surgery

## 2015-05-25 ENCOUNTER — Encounter: Payer: Self-pay | Admitting: Cardiothoracic Surgery

## 2015-05-25 ENCOUNTER — Ambulatory Visit
Admission: RE | Admit: 2015-05-25 | Discharge: 2015-05-25 | Disposition: A | Discharge status: Home / Self Care | Payer: Medicare Other | Source: Ambulatory Visit | Attending: Cardiothoracic Surgery | Admitting: Cardiothoracic Surgery

## 2015-05-25 VITALS — BP 97/56 | HR 65 | Resp 16 | Ht 71.0 in | Wt 211.0 lb

## 2015-05-25 DIAGNOSIS — I6523 Occlusion and stenosis of bilateral carotid arteries: Secondary | ICD-10-CM | POA: Diagnosis not present

## 2015-05-25 DIAGNOSIS — D151 Benign neoplasm of heart: Secondary | ICD-10-CM | POA: Diagnosis not present

## 2015-05-25 DIAGNOSIS — Z85118 Personal history of other malignant neoplasm of bronchus and lung: Secondary | ICD-10-CM | POA: Diagnosis not present

## 2015-05-25 DIAGNOSIS — Z951 Presence of aortocoronary bypass graft: Secondary | ICD-10-CM

## 2015-05-25 DIAGNOSIS — I25119 Atherosclerotic heart disease of native coronary artery with unspecified angina pectoris: Secondary | ICD-10-CM

## 2015-05-25 DIAGNOSIS — Z902 Acquired absence of lung [part of]: Secondary | ICD-10-CM

## 2015-05-25 NOTE — Progress Notes (Signed)
Calvin [name] Record #[id number] Date of Birth: 1932-09-15  Referring: Lona Kettle, MD Primary Care:  [name], MD  Chief Complaint:   Follow up from Lung CA and myxoma  History of Present Illness:     Patient is a 82 year old who 17 years ago underwent left pneumonectomy for a squamous cell carcinoma.10  years ago he underwent coronary bypass grafting and resection of atrial myxoma. He has known underlying pulmonary disease is currently somewhat limited by shortness of breath.   He uses oxygen continuously.   Imaging considering his limitations he continues to cut his grass with a riding lawn more equipment with his oxygen tank.    He returns today for followup visit and chest x-ray after previous pneumonectomy.    Past Medical History  Diagnosis Date  . Essential hypertension   . Hypertriglyceridemia   . CAD (coronary artery disease)     a. 06/2005 s/p CABG x1 (VG->RCA) @ time of myxoma resection.  . 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  . Carotid artery disease ([place])     a. 06/2005 s/p R CEA;  b. 10/2013 Carotid U/S: patent RICA, LICA 97-74%;  c. 04/4237 U/S: RICA 53-20%, LICA 23-34%.  . Hearing loss   . COPD (chronic obstructive pulmonary disease) ([place])   . Atrial flutter with rapid ventricular response ([place])     a. 2007 s/p DCCV following CV surgery;  b. 10/2014->CHA2DS2VASc = 5-->Xarelto.  . Squamous cell lung cancer ([place])     a. s/p L pneumonectomy in 2000.  . Type II diabetes mellitus ([place])   . Scarlet fever     a. ~ 1942.  Marland Kitchen History of pneumonia   . GERD (gastroesophageal reflux disease)   . Daily headache   . Arthritis   . Depression   . On home oxygen therapy     "2L; 24/7" (11/03/2014)  . Stroke [facility])      History  Smoking status  . Former Smoker -- 2.00 packs/day for 43 years  . Types: Cigarettes  . Quit date: 07/21/1997  Smokeless tobacco  . Never Used    History  Alcohol Use  . 0.0 oz/week  . 0  Standard drinks or equivalent per week    Comment: 11/03/2014 "never drank much; stopped in the early 1970's"     Allergies  Allergen Reactions  . Statins Other (See Comments)    Muscle aches  . Sulfa Antibiotics   . Amoxicillin Rash    Current Outpatient Prescriptions  Medication Sig Dispense Refill  . acetaminophen (TYLENOL) 325 MG tablet Take 325 mg by mouth every 6 (six) hours as needed for mild pain, moderate pain or headache. For pain    . albuterol (PROVENTIL HFA;VENTOLIN HFA) 108 (90 BASE) MCG/ACT inhaler Inhale 2 puffs into the lungs every 6 (six) hours as needed for wheezing.    . carvedilol (COREG) 12.5 MG tablet Take 1 tablet (12.5 mg total) by mouth 2 (two) times daily with a meal. 180 tablet 3  . cetirizine (ZYRTEC) 10 MG tablet Take 10 mg by mouth daily as needed for allergies or rhinitis. For allergies    . cholecalciferol (VITAMIN D) 1000 UNITS tablet Take 2,000 Units by mouth 2 (two) times daily.     Marland Kitchen diltiazem (CARDIZEM CD) 180 MG 24 hr capsule Take 1 capsule (180 mg total) by mouth  daily. 90 capsule 3  . EPIPEN 2-PAK 0.3 MG/0.3ML DEVI Inject 0.3 mg into the skin See admin instructions. Take as needed for severe allergic reaction    . ipratropium-albuterol (DUONEB) 0.5-2.5 (3) MG/3ML SOLN Take 3 mLs by nebulization 3 (three) times daily. 270 mL 11  . losartan-hydrochlorothiazide (HYZAAR) 100-25 MG per tablet Take 1 tablet by mouth daily. 90 tablet 3  . magnesium oxide (MAG-OX) 400 MG tablet Take 400 mg by mouth at bedtime.     . metFORMIN (GLUCOPHAGE) 500 MG tablet Take 500 mg by mouth daily with breakfast.     . Multiple Vitamins-Minerals (MULTIVITAMINS THER. W/MINERALS) TABS Take 1 tablet by mouth daily.    . Omega-3 Fatty Acids (FISH OIL) 1200 MG CAPS Take 1 capsule by mouth 2 (two) times daily.    . pravastatin (PRAVACHOL) 20 MG tablet Take 1 tablet (20 mg total) by mouth every other day. 45 tablet 1  . ranitidine (ZANTAC) 150 MG capsule Take 150 mg by mouth at  bedtime as needed for heartburn.     . rivaroxaban (XARELTO) 20 MG TABS tablet Take 1 tablet (20 mg total) by mouth daily with supper. 30 tablet 0   No current facility-administered medications for this visit.       Physical Exam: BP 97/56 mmHg  Pulse 65  Resp 16  Ht '5\' 11"'$  (1.803 m)  Wt 211 lb (95.709 kg)  BMI 29.44 kg/m2  SpO2 92%  General appearance: alert, cooperative and no distress Neurologic: intact Heart:  Irregularly irregular r rate and rhythm, S1, S2 normal, no murmur, click, rub or gallop Lungs: diminished breath sounds left chest Abdomen: soft, non-tender; bowel sounds normal; no masses,  no organomegaly Extremities: extremities normal, atraumatic, no cyanosis or edema, no edema, redness or tenderness in the calves or thighs and no ulcers, gangrene or trophic changes Patient has no cervical supraclavicular adenopathy  Diagnostic Studies & Laboratory data:     Recent Radiology Findings:   Dg Chest 2 View  05/25/2015  CLINICAL DATA:  Coronary artery disease with angina pectoris. EXAM: CHEST  2 VIEW COMPARISON:  October 27, 2014. FINDINGS: Status post coronary artery bypass graft. Status post left pneumonectomy with extensive pleural calcification which is unchanged compared to prior exam. Right lung is clear. Bony thorax is unremarkable. No pneumothorax is noted. IMPRESSION: Stable findings consistent with previous left pneumonectomy. No acute abnormality seen. Electronically Signed   By: Marijo Conception, M.D.   On: 05/25/2015 09:39    I have independently reviewed the above radiology studies  and reviewed the findings with the patient.   Recent Lab Findings: Lab Results  Component Value Date   WBC 10.0 11/03/2014   HGB 12.5* 11/03/2014   HCT 37.3* 11/03/2014   PLT 212 11/03/2014   GLUCOSE 116* 11/04/2014   CHOL 238* 05/18/2011   TRIG 291* 05/18/2011   HDL 36* 05/18/2011   LDLCALC 144* 05/18/2011   ALT 21 09/02/2013   AST 19 09/02/2013   NA 138 11/04/2014   K  3.7 11/04/2014   CL 96* 11/04/2014   CREATININE 1.40* 11/04/2014   BUN 21* 11/04/2014   CO2 38* 11/04/2014   TSH 1.609 11/03/2014   INR 1.01 09/03/2013   HGBA1C 6.0* 05/18/2011   ECHO 2013: last echo [facility]* [address], [place] [address] [phone]  ------------------------------------------------------------ Echocardiography  Patient: Calvin [name], Calvin [name] MR #: [id number] Study Date: 05/18/2011 Gender: M Age: 32 Height: 180.3cm Weight:  95kg BSA: 2.71m2 Pt. Status: [facility]: [facility] SONOGRAPHER CD[place] Kitten RCS [place]ING [place] ADM[place]ING [place]END[place]:  ------------------------------------------------------------ LV EF: 55% - 60%  ------------------------------------------------------------ Indications: CVA 49  ------------------------------------------------------------ History: PMH: Coronary artery disease. Risk factors: Former tobacco use. Hypertension.  ------------------------------------------------------------ Study Conclusions  - Left ventricle: The cavity size was normal. Systolic function was normal. The estimated ejection fraction was in the range of 55% to 60%. - Left atrium: There is no evidence of left atrial myxoma. Impressions:  - No cardiac source of emboli was indentified. Recommendations: Consider transesophageal echocardiography if clinically indicated. Echocardiography. M-mode, complete 2D, spectral Doppler, and color Doppler. Height: Height: 180.3cm. Height: 71in. Weight: Weight: 95kg. Weight: 209lb. Body mass index: BMI: 29.2kg/m^2. Body surface area: BSA: 2.224m. Blood pressure: 136/73. Patient status: Inpatient. Location: Bedside.  ------------------------------------------------------------  ------------------------------------------------------------ Left ventricle: The cavity size was normal.  Systolic function was normal. The estimated ejection fraction was in the range of 55% to 60%. Septal bounce was present but there was no Doppler evidence of restrictive or constrictive physiology.  ------------------------------------------------------------ Aortic valve: Mildly thickened, mildly calcified leaflets. Cusp separation was normal. Doppler: Transvalvular velocity was within the normal range. There was no stenosis. No regurgitation.  ------------------------------------------------------------ Aorta: Ascending aorta: The ascending aorta was mildly dilated.  ------------------------------------------------------------ Mitral valve: Structurally normal valve. Leaflet separation was normal. Doppler: Transvalvular velocity was within the normal range. There was no evidence for stenosis. Trivial regurgitation. Peak gradient: 62m8mg (D).  ------------------------------------------------------------ Left atrium: The atrium was normal in size. There is no evidence of left atrial myxoma.  ------------------------------------------------------------ Right ventricle: The cavity size was normal. Wall thickness was normal. Systolic function was normal.  ------------------------------------------------------------ Pulmonic valve: Structurally normal valve. Cusp separation was normal. Doppler: Transvalvular velocity was within the normal range. Trivial regurgitation.  ------------------------------------------------------------ Tricuspid valve: Structurally normal valve. Leaflet separation was normal. Doppler: Transvalvular velocity was within the normal range. No regurgitation.  ------------------------------------------------------------ Pulmonary artery: The main pulmonary artery was normal-sized.  ------------------------------------------------------------ Right atrium: The atrium was normal in  size.  ------------------------------------------------------------ Pericardium: The pericardium was normal in appearance. There was no pericardial effusion.  ------------------------------------------------------------ Systemic veins: Inferior vena cava: The vessel was normal in size; the respirophasic diameter changes were in the normal range (= 50%); findings are consistent with normal central venous pressure.  ------------------------------------------------------------  2D measurements Normal Doppler Normal Left ventricle measurements LVID ED, 44.6 mm 43-52 Left ventricle chord, Ea, lat 9.65 cm/ ------- PLAX ann, tiss s LVID ES, 31.1 mm 23-38 DP chord, E/Ea, lat 11.5 ------- PLAX ann, tiss FS, chord, 30 % >29 DP PLAX Ea, med 10.1 cm/ ------- LVPW, ED 11.1 mm ------ ann, tiss s IVS/LVPW 1.05 <1.3 DP ratio, ED E/Ea, med 10.99 ------- Ventricular septum ann, tiss IVS, ED 11.7 mm ------ DP Aorta Mitral valve Root diam, 42 mm ------ Peak E vel 111 cm/ ------- ED s Left atrium Peak A vel 137 cm/ ------- AP dim 38 mm ------ s AP dim 1.73 cm/m^2 <2.2 Deceleratio 232 ms 150-230 index n time Peak 5 mm ------- gradient, D Hg Peak E/A 0.8 ------- ratio  ------------------------------------------------------------ Prepared and Electronically Authenticated by  MarCandee FurbishD 2013-01-26T20:10:42.753   Assessment / Plan:      Patient is stable now 17 years after left pneumonectomy for 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 No evidence of recurrance Atrial Myxoma resected in 2007 no evidence of recurrence on echo 2013  He continues  on home oxygen continuously, but continues to work around his house  in atrial fib on [place] see the patient back in one year,  With chest x-ray  Grace Isaac MD  Beeper [phone] Office [phone] 05/25/2015 10:18 AM

## 2015-07-13 ENCOUNTER — Ambulatory Visit (INDEPENDENT_AMBULATORY_CARE_PROVIDER_SITE_OTHER): Payer: Medicare Other | Admitting: Cardiology

## 2015-07-13 ENCOUNTER — Encounter: Payer: Self-pay | Admitting: Cardiology

## 2015-07-13 ENCOUNTER — Other Ambulatory Visit: Payer: Self-pay | Admitting: *Deleted

## 2015-07-13 VITALS — BP 104/50 | HR 68 | Ht 71.0 in | Wt 212.0 lb

## 2015-07-13 DIAGNOSIS — I4891 Unspecified atrial fibrillation: Secondary | ICD-10-CM

## 2015-07-13 DIAGNOSIS — N289 Disorder of kidney and ureter, unspecified: Secondary | ICD-10-CM | POA: Diagnosis not present

## 2015-07-13 DIAGNOSIS — I10 Essential (primary) hypertension: Secondary | ICD-10-CM | POA: Diagnosis not present

## 2015-07-13 DIAGNOSIS — I6523 Occlusion and stenosis of bilateral carotid arteries: Secondary | ICD-10-CM

## 2015-07-13 DIAGNOSIS — D151 Benign neoplasm of heart: Secondary | ICD-10-CM

## 2015-07-13 DIAGNOSIS — I480 Paroxysmal atrial fibrillation: Secondary | ICD-10-CM

## 2015-07-13 DIAGNOSIS — I2510 Atherosclerotic heart disease of native coronary artery without angina pectoris: Secondary | ICD-10-CM

## 2015-07-13 MED ORDER — PRAVASTATIN SODIUM 20 MG PO TABS: 20.0000 mg | ORAL_TABLET | ORAL | Status: DC

## 2015-07-13 NOTE — Patient Instructions (Addendum)
Medication Instructions:   Your physician recommends that you continue on your current medications as directed. Please refer to the Current Medication list given to you today.  START TAKING COREG AT 8 PM   If you need a refill on your cardiac medications before your next appointment, please call your pharmacy.  Labwork:  NONE ORDER TODAY    Testing/Procedures: Your physician has requested that you have a renal artery duplex. During this test, an ultrasound is used to evaluate blood flow to the kidneys. Allow one hour for this exam. Do not eat after midnight the day before and avoid carbonated beverages. Take your medications as you usually do.   Follow-Up:  FOLLOW UP 2 TO 3 WEEKS  AFTER DUPLEX  WITH AVAILABLE   WITH PA OR NP    Any Other Special Instructions Will Be Listed Below (If Applicable).  IF  BLOOD PRESSURE TOP NUMBER  RISES TO 160 YOU MAY TAKE AN  EXTRA  1/2   COREG IMMEDIATELY

## 2015-07-13 NOTE — Progress Notes (Signed)
Cardiology Office Note   Date:  07/13/2015   ID:  [name], [name] 1932/08/11, MRN [id number]  PCP:   [name], MD  Cardiologist:  Dr. Irish Lack    Chief Complaint  Patient presents with  . Hypertension    bP up and down      History of Present Illness: Calvin [name] is a 82 y.o. male who presents for labile hypertension.  17 years ago underwent left pneumonectomy for a squamous cell carcinoma.10 years ago he underwent coronary bypass grafting X 1 and resection of atrial myxoma-2007. He has known underlying pulmonary disease is currently somewhat limited by shortness of breath. He uses oxygen continuously. status post a right CEA in March 2007. The patient reports having had a TIA resulting in some left-sided paresthesias lasting less than 24 hours. The patient was hospitalized in January 2013 for this for 2 days. The patient and his wife state they are following up with [facility] regarding any symptoms or neural deficit from that TIA. The patient states he is recovered "fully", and has had no further TIA activity. PA flutter is chronic and pt on Xarelto.  Last cath 2011 with single vessel disease with occluded RCA and patent VG to RCA patent.  EF 65%. Last echo 2013:  Study Conclusions - Left ventricle: The cavity size was normal. Systolic function was normal. The estimated ejection fraction was in the range of 55% to 60%. - Left atrium: There is no evidence of left atrial myxoma. Impressions:  Here today for BP extremes.  At night midnight or so- his BP will be up to 190/80 but in afternoon 573 systolic and some lightheadedness.  No chest pain or any increased SOB.  His activity is limited due to his lung issues, though he mows grass on riding lawn mower and tractor.  He takes coreg, Production assistant, radio and dilt in am and coreg at supper.  No awareness of irregular HR or palpitations.    Past Medical History  Diagnosis Date  . Essential hypertension   .  Hypertriglyceridemia   . CAD (coronary artery disease)     a. 06/2005 s/p CABG x1 (VG->RCA) @ time of myxoma resection.  . 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  . Carotid artery disease ([place])     a. 06/2005 s/p R CEA;  b. 10/2013 Carotid U/S: patent RICA, LICA 22-02%;  c. 08/4268 U/S: RICA 62-37%, LICA 62-83%.  . Hearing loss   . COPD (chronic obstructive pulmonary disease) ([place])   . Atrial flutter with rapid ventricular response ([place])     a. 2007 s/p DCCV following CV surgery;  b. 10/2014->CHA2DS2VASc = 5-->Xarelto.  . Squamous cell lung cancer ([place])     a. s/p L pneumonectomy in 2000.  . Type II diabetes mellitus ([place])   . Scarlet fever     a. ~ 1942.  Marland Kitchen History of pneumonia   . GERD (gastroesophageal reflux disease)   . Daily headache   . Arthritis   . Depression   . On home oxygen therapy     "2L; 24/7" (11/03/2014)  . Stroke [facility])     Past Surgical History  Procedure Laterality Date  . Coronary artery bypass graft  07/12/2005    Dr Servando Snare, CABG x 1 with reverse saphenous vein graft to distal rt coronary artery and excision of left atrial myxoma [Other]  . Pneumonectomy Left 05/03/1998    Dr Servando Snare  . Combined mediastinoscopy and bronchoscopy  05/01/1998    Dr Servando Snare  . Carotid endarterectomy Right 2007  . Cardiac catheterization  06/2005; 01/2010    Archie Endo 10/06/2010; Archie Endo 02/02/2010  . Transesophageal echocardiogram  06/2005    Archie Endo 09/04/2010  . Cardioversion  07/2005    DCC/notes 09/04/2010  . Renal angiogram Bilateral 12/2005    Archie Endo 09/04/2010     Current Outpatient Prescriptions  Medication Sig Dispense Refill  . acetaminophen (TYLENOL) 325 MG tablet Take 325 mg by mouth every 6 (six) hours as needed for mild pain, moderate pain or headache. For pain    . albuterol (PROVENTIL HFA;VENTOLIN HFA) 108 (90 BASE) MCG/ACT inhaler Inhale 2 puffs into the lungs every 6 (six) hours as needed for wheezing.    . carvedilol (COREG) 12.5 MG tablet Take 1  tablet (12.5 mg total) by mouth 2 (two) times daily with a meal. (Patient taking differently: Take 12.5 mg by mouth 2 (two) times daily with a meal. TAEK 2ND DOSE AT 8PM) 180 tablet 3  . cetirizine (ZYRTEC) 10 MG tablet Take 10 mg by mouth daily as needed for allergies or rhinitis. For allergies    . cholecalciferol (VITAMIN D) 1000 UNITS tablet Take 2,000 Units by mouth 2 (two) times daily.     Marland Kitchen diltiazem (CARDIZEM CD) 180 MG 24 hr capsule Take 1 capsule (180 mg total) by mouth daily. 90 capsule 3  . EPIPEN 2-PAK 0.3 MG/0.3ML DEVI Inject 0.3 mg into the skin See admin instructions. Take as needed for severe allergic reaction    . ipratropium-albuterol (DUONEB) 0.5-2.5 (3) MG/3ML SOLN Take 3 mLs by nebulization 3 (three) times daily. 270 mL 11  . losartan-hydrochlorothiazide (HYZAAR) 100-25 MG per tablet Take 1 tablet by mouth daily. 90 tablet 3  . magnesium oxide (MAG-OX) 400 MG tablet Take 400 mg by mouth at bedtime.     . metFORMIN (GLUCOPHAGE) 500 MG tablet Take 500 mg by mouth daily with breakfast.     . Multiple Vitamins-Minerals (MULTIVITAMINS THER. W/MINERALS) TABS Take 1 tablet by mouth daily.    . Omega-3 Fatty Acids (FISH OIL) 1200 MG CAPS Take 1 capsule by mouth 2 (two) times daily.    . pravastatin (PRAVACHOL) 20 MG tablet Take 1 tablet (20 mg total) by mouth every other day. 45 tablet 1  . ranitidine (ZANTAC) 150 MG capsule Take 150 mg by mouth at bedtime as needed for heartburn.     . rivaroxaban (XARELTO) 20 MG TABS tablet Take 1 tablet (20 mg total) by mouth daily with supper. 30 tablet 0  . triamcinolone cream (KENALOG) 0.1 %      No current facility-administered medications for this visit.    Allergies:   Statins; Sulfa antibiotics; and Amoxicillin    Social History:  The patient  reports that he quit smoking about 17 years ago. His smoking use included Cigarettes. He has a 86 pack-year smoking history. He has never used smokeless tobacco. He reports that he drinks alcohol.  He reports that he does not use illicit drugs.   Family History:  The patient's family history includes Cancer in his father and sister; Diabetes in his mother; Heart attack in his mother; Heart disease in his brother, mother, and sister; Hyperlipidemia in his brother, father, and mother; Hypertension in his sister; Stomach cancer in his father.    ROS:  General:no colds or fevers, no weight changes Skin:no rashes or ulcers HEENT:no blurred vision, no congestion very hard of hearing CV:see HPI PUL:see HPI GI:no diarrhea constipation or  melena, no indigestion GU:no hematuria, no dysuria MS:no joint pain, no claudication Neuro:no syncope, + lightheadedness- episodic Endo:+ diabetes, no thyroid disease Diet he does have sausage and sausage gravy once a week  Wt Readings from Last 3 Encounters:  07/13/15 212 lb (96.163 kg)  05/25/15 211 lb (95.709 kg)  05/23/15 211 lb (95.709 kg)     PHYSICAL EXAM: VS:  BP 104/50 mmHg  Pulse 68  Ht '5\' 11"'$  (1.803 m)  Wt 212 lb (96.163 kg)  BMI 29.58 kg/m2 , BMI Body mass index is 29.58 kg/(m^2). General:Pleasant affect, NAD Skin:Warm and dry, brisk capillary refill HEENT:normocephalic, sclera clear, mucus membranes moist, hard of hearing Neck:supple, no JVD, no bruits  Heart:S1S2 RRR without murmur, gallup, rub or click Lungs:clear without rales, rhonchi, or wheezes EXN:TZGY, non tender, + BS, do not palpate liver spleen or masses Ext:no lower ext edema to trace,  2+ radial pulses Neuro:alert and oriented X 3, MAE, follows commands, + facial symmetry    EKG:  EKG is ordered today. The ekg ordered today demonstrates SR rate 72  No acute changes.     Recent Labs: 11/03/2014: Hemoglobin 12.5*; Magnesium 2.2; Platelets 212; TSH 1.609 11/04/2014: BUN 21*; Creatinine, Ser 1.40*; Potassium 3.7; Sodium 138    Lipid Panel    Component Value Date/Time   CHOL 238* 05/18/2011 0500   TRIG 291* 05/18/2011 0500   HDL 36* 05/18/2011 0500   CHOLHDL  6.6 05/18/2011 0500   VLDL 58* 05/18/2011 0500   LDLCALC 144* 05/18/2011 0500       Other studies Reviewed: Additional studies/ records that were reviewed today include: see above and previous notes.   ASSESSMENT AND PLAN:  1. Coronary artery disease: Status post one-vessel bypass at the time of his cardiac surgery to remove the intracardiac tumor. No angina at this time. 2. Hyperlipidemia: start pravastatin 20 mg daily. If he cannot tolerate this, would decrease it to 10 mg daily. We talked about PCS K9 inhibitor. He is not interested in an injectable cholesterol-lowering medicine. Labs have been followed with his primary care doctor, Dr. Harrington Challenger. 3. Atrial flutter. OK to take an extra cardizem tablet if needed. Tolerating anticoagulation for stroke prevention. No bleeding issues.  4. Carotid artery disease: We talked about aggressive secondary prevention. He has been intolerant of his statins. He is willing to try the low-dose pravastatin. Followed at [facility]. 5. Labile BP he will decrease salt use knee high support hose, he will take PM coreg at 8 and if he awakes with elevated BP he will take an extra half of coreg just 6.25.  If BP continues to be low may need to divide the dilt.  See him back in 2-3 weeks for follow up and have renal art. Duplex done to eval for renal stenosis.    Current medicines are reviewed with the patient today.  The patient Has no concerns regarding medicines.  The following changes have been made:  See above Labs/ tests ordered today include:see above  Disposition:   FU:  see above  Lennie Muckle, NP  07/13/2015 10:55 PM    [facility] [place], [place], [place], [place] Phone: [phone]; Fax: [phone]

## 2015-07-22 DIAGNOSIS — K922 Gastrointestinal hemorrhage, unspecified: Secondary | ICD-10-CM

## 2015-07-22 HISTORY — DX: Gastrointestinal hemorrhage, unspecified: K92.2

## 2015-07-24 ENCOUNTER — Inpatient Hospital Stay (HOSPITAL_COMMUNITY): Admission: RE | Admit: 2015-07-24 | Payer: Medicare Other | Source: Ambulatory Visit

## 2015-07-25 ENCOUNTER — Emergency Department (HOSPITAL_COMMUNITY): Payer: Medicare Other

## 2015-07-25 ENCOUNTER — Inpatient Hospital Stay (HOSPITAL_COMMUNITY)
Admission: EM | Admit: 2015-07-25 | Discharge: 2015-07-31 | DRG: 378 | Disposition: A | Discharge status: Home / Self Care | Payer: Medicare Other | Attending: Internal Medicine | Admitting: Internal Medicine

## 2015-07-25 ENCOUNTER — Telehealth: Payer: Self-pay | Admitting: Cardiology

## 2015-07-25 ENCOUNTER — Encounter (HOSPITAL_COMMUNITY): Payer: Self-pay | Admitting: Emergency Medicine

## 2015-07-25 DIAGNOSIS — D151 Benign neoplasm of heart: Secondary | ICD-10-CM | POA: Diagnosis present

## 2015-07-25 DIAGNOSIS — I482 Chronic atrial fibrillation: Secondary | ICD-10-CM | POA: Diagnosis present

## 2015-07-25 DIAGNOSIS — Z91030 Bee allergy status: Secondary | ICD-10-CM

## 2015-07-25 DIAGNOSIS — I951 Orthostatic hypotension: Secondary | ICD-10-CM | POA: Diagnosis present

## 2015-07-25 DIAGNOSIS — D500 Iron deficiency anemia secondary to blood loss (chronic): Secondary | ICD-10-CM | POA: Diagnosis present

## 2015-07-25 DIAGNOSIS — Z79899 Other long term (current) drug therapy: Secondary | ICD-10-CM | POA: Diagnosis not present

## 2015-07-25 DIAGNOSIS — Z9981 Dependence on supplemental oxygen: Secondary | ICD-10-CM

## 2015-07-25 DIAGNOSIS — I480 Paroxysmal atrial fibrillation: Secondary | ICD-10-CM | POA: Diagnosis not present

## 2015-07-25 DIAGNOSIS — Z951 Presence of aortocoronary bypass graft: Secondary | ICD-10-CM | POA: Diagnosis not present

## 2015-07-25 DIAGNOSIS — Z880 Allergy status to penicillin: Secondary | ICD-10-CM | POA: Diagnosis not present

## 2015-07-25 DIAGNOSIS — D62 Acute posthemorrhagic anemia: Secondary | ICD-10-CM | POA: Diagnosis present

## 2015-07-25 DIAGNOSIS — D649 Anemia, unspecified: Secondary | ICD-10-CM | POA: Diagnosis present

## 2015-07-25 DIAGNOSIS — I2510 Atherosclerotic heart disease of native coronary artery without angina pectoris: Secondary | ICD-10-CM | POA: Diagnosis present

## 2015-07-25 DIAGNOSIS — K5521 Angiodysplasia of colon with hemorrhage: Principal | ICD-10-CM | POA: Diagnosis present

## 2015-07-25 DIAGNOSIS — Z7984 Long term (current) use of oral hypoglycemic drugs: Secondary | ICD-10-CM | POA: Diagnosis not present

## 2015-07-25 DIAGNOSIS — J449 Chronic obstructive pulmonary disease, unspecified: Secondary | ICD-10-CM | POA: Diagnosis present

## 2015-07-25 DIAGNOSIS — I4891 Unspecified atrial fibrillation: Secondary | ICD-10-CM | POA: Diagnosis present

## 2015-07-25 DIAGNOSIS — Z7901 Long term (current) use of anticoagulants: Secondary | ICD-10-CM

## 2015-07-25 DIAGNOSIS — Z8673 Personal history of transient ischemic attack (TIA), and cerebral infarction without residual deficits: Secondary | ICD-10-CM

## 2015-07-25 DIAGNOSIS — K921 Melena: Secondary | ICD-10-CM | POA: Diagnosis present

## 2015-07-25 DIAGNOSIS — Z882 Allergy status to sulfonamides status: Secondary | ICD-10-CM

## 2015-07-25 DIAGNOSIS — K922 Gastrointestinal hemorrhage, unspecified: Secondary | ICD-10-CM | POA: Diagnosis present

## 2015-07-25 DIAGNOSIS — K219 Gastro-esophageal reflux disease without esophagitis: Secondary | ICD-10-CM | POA: Diagnosis present

## 2015-07-25 DIAGNOSIS — H9190 Unspecified hearing loss, unspecified ear: Secondary | ICD-10-CM | POA: Diagnosis present

## 2015-07-25 DIAGNOSIS — E119 Type 2 diabetes mellitus without complications: Secondary | ICD-10-CM | POA: Diagnosis present

## 2015-07-25 DIAGNOSIS — I10 Essential (primary) hypertension: Secondary | ICD-10-CM | POA: Diagnosis not present

## 2015-07-25 DIAGNOSIS — I4892 Unspecified atrial flutter: Secondary | ICD-10-CM | POA: Diagnosis present

## 2015-07-25 DIAGNOSIS — Z87891 Personal history of nicotine dependence: Secondary | ICD-10-CM | POA: Diagnosis not present

## 2015-07-25 DIAGNOSIS — E781 Pure hyperglyceridemia: Secondary | ICD-10-CM | POA: Diagnosis present

## 2015-07-25 DIAGNOSIS — Z902 Acquired absence of lung [part of]: Secondary | ICD-10-CM | POA: Diagnosis not present

## 2015-07-25 DIAGNOSIS — I481 Persistent atrial fibrillation: Secondary | ICD-10-CM | POA: Diagnosis not present

## 2015-07-25 DIAGNOSIS — Z85118 Personal history of other malignant neoplasm of bronchus and lung: Secondary | ICD-10-CM

## 2015-07-25 HISTORY — DX: Gastrointestinal hemorrhage, unspecified: K92.2

## 2015-07-25 LAB — CBC WITH DIFFERENTIAL/PLATELET
BASOS ABS: 0 10*3/uL (ref 0.0–0.1)
BASOS PCT: 0 %
EOS ABS: 0.2 10*3/uL (ref 0.0–0.7)
EOS PCT: 2 %
HCT: 19.1 % — ABNORMAL LOW (ref 39.0–52.0)
HEMOGLOBIN: 5.8 g/dL — AB (ref 13.0–17.0)
Lymphocytes Relative: 16 %
Lymphs Abs: 1.4 10*3/uL (ref 0.7–4.0)
MCH: 29.9 pg (ref 26.0–34.0)
MCHC: 30.4 g/dL (ref 30.0–36.0)
MCV: 98.5 fL (ref 78.0–100.0)
Monocytes Absolute: 0.9 10*3/uL (ref 0.1–1.0)
Monocytes Relative: 10 %
NEUTROS PCT: 71 %
Neutro Abs: 6.3 10*3/uL (ref 1.7–7.7)
PLATELETS: 160 10*3/uL (ref 150–400)
RBC: 1.94 MIL/uL — AB (ref 4.22–5.81)
RDW: 14.2 % (ref 11.5–15.5)
WBC: 8.8 10*3/uL (ref 4.0–10.5)

## 2015-07-25 LAB — COMPREHENSIVE METABOLIC PANEL
ALBUMIN: 3.2 g/dL — AB (ref 3.5–5.0)
ALK PHOS: 42 U/L (ref 38–126)
ALT: 16 U/L — AB (ref 17–63)
AST: 19 U/L (ref 15–41)
Anion gap: 8 (ref 5–15)
BUN: 30 mg/dL — AB (ref 6–20)
CALCIUM: 9.2 mg/dL (ref 8.9–10.3)
CHLORIDE: 99 mmol/L — AB (ref 101–111)
CO2: 32 mmol/L (ref 22–32)
CREATININE: 1.28 mg/dL — AB (ref 0.61–1.24)
GFR calc non Af Amer: 51 mL/min — ABNORMAL LOW (ref >60)
GFR, EST AFRICAN AMERICAN: 59 mL/min — AB (ref >60)
GLUCOSE: 100 mg/dL — AB (ref 65–99)
Potassium: 3.7 mmol/L (ref 3.5–5.1)
SODIUM: 139 mmol/L (ref 135–145)
Total Bilirubin: 0.4 mg/dL (ref 0.3–1.2)
Total Protein: 5.3 g/dL — ABNORMAL LOW (ref 6.5–8.1)

## 2015-07-25 LAB — PREPARE RBC (CROSSMATCH)

## 2015-07-25 LAB — URINALYSIS, ROUTINE W REFLEX MICROSCOPIC
Bilirubin Urine: NEGATIVE
GLUCOSE, UA: NEGATIVE mg/dL
Hgb urine dipstick: NEGATIVE
Ketones, ur: NEGATIVE mg/dL
LEUKOCYTES UA: NEGATIVE
Nitrite: NEGATIVE
PROTEIN: NEGATIVE mg/dL
SPECIFIC GRAVITY, URINE: 1.014 (ref 1.005–1.030)
pH: 6 (ref 5.0–8.0)

## 2015-07-25 LAB — BRAIN NATRIURETIC PEPTIDE: B Natriuretic Peptide: 138.4 pg/mL — ABNORMAL HIGH (ref 0.0–100.0)

## 2015-07-25 LAB — POC OCCULT BLOOD, ED: FECAL OCCULT BLD: POSITIVE — AB

## 2015-07-25 LAB — TROPONIN I: TROPONIN I: 0.08 ng/mL — AB (ref <0.031)

## 2015-07-25 MED ORDER — SODIUM CHLORIDE 0.9% FLUSH
3.0000 mL | Freq: Two times a day (BID) | INTRAVENOUS | Status: DC
  Administered 2015-07-25: 10 mL via INTRAVENOUS
  Administered 2015-07-26 – 2015-07-31 (×7): 3 mL via INTRAVENOUS

## 2015-07-25 MED ORDER — ONDANSETRON HCL 4 MG/2ML IJ SOLN: 4.0000 mg | Freq: Three times a day (TID) | INTRAMUSCULAR | Status: AC | PRN

## 2015-07-25 MED ORDER — SODIUM CHLORIDE 0.9 % IV SOLN
INTRAVENOUS | Status: AC
  Administered 2015-07-26: 03:00:00 via INTRAVENOUS

## 2015-07-25 MED ORDER — SODIUM CHLORIDE 0.9 % IV SOLN: Freq: Once | INTRAVENOUS | Status: DC

## 2015-07-25 MED ORDER — IPRATROPIUM-ALBUTEROL 0.5-2.5 (3) MG/3ML IN SOLN
3.0000 mL | Freq: Three times a day (TID) | RESPIRATORY_TRACT | Status: DC
  Administered 2015-07-25 – 2015-07-31 (×14): 3 mL via RESPIRATORY_TRACT
  Filled 2015-07-25 (×17): qty 3

## 2015-07-25 MED ORDER — SODIUM CHLORIDE 0.9 % IV SOLN
80.0000 mg | Freq: Two times a day (BID) | INTRAVENOUS | Status: DC
  Administered 2015-07-25 – 2015-07-29 (×8): 80 mg via INTRAVENOUS
  Filled 2015-07-25 (×14): qty 80

## 2015-07-25 MED ORDER — CARVEDILOL 12.5 MG PO TABS
12.5000 mg | ORAL_TABLET | Freq: Two times a day (BID) | ORAL | Status: DC
  Administered 2015-07-26 – 2015-07-31 (×11): 12.5 mg via ORAL
  Filled 2015-07-25 (×11): qty 1

## 2015-07-25 MED ORDER — ALBUTEROL SULFATE (2.5 MG/3ML) 0.083% IN NEBU: 2.5000 mg | INHALATION_SOLUTION | Freq: Four times a day (QID) | RESPIRATORY_TRACT | Status: DC | PRN

## 2015-07-25 MED ORDER — DILTIAZEM HCL ER COATED BEADS 180 MG PO CP24
180.0000 mg | ORAL_CAPSULE | Freq: Every day | ORAL | Status: DC
  Administered 2015-07-26 – 2015-07-31 (×5): 180 mg via ORAL
  Filled 2015-07-25 (×6): qty 1

## 2015-07-25 MED ORDER — ACETAMINOPHEN 325 MG PO TABS: 325.0000 mg | ORAL_TABLET | Freq: Four times a day (QID) | ORAL | Status: DC | PRN

## 2015-07-25 MED ORDER — ALBUTEROL SULFATE HFA 108 (90 BASE) MCG/ACT IN AERS: 2.0000 | INHALATION_SPRAY | Freq: Four times a day (QID) | RESPIRATORY_TRACT | Status: DC | PRN

## 2015-07-25 NOTE — ED Notes (Signed)
Pt was taken up stairs by EMT Jeneen Rinks with blood and blood transfusion form and consent.  Blood had just been brought by by I from blood bank.  When arrived to floor [place] walked in room and ask how long blood had been sitting on bed and I advised her that i had just got it from the blood bank and she stated "well the time is 2004 and that she only had 5 mins to hang same." Advised her that i would go back to the blood bank and pick up more blood if there was a problem.  She stated "ok thank you"  EMT Tyron Manetta walked to blood back and picked up more blood and returned the previous blood and explained what had happened to lab tech.  Paperwork filled out and more blood taken to 5w to pts room.  Handed blood to NURSE.

## 2015-07-25 NOTE — ED Provider Notes (Signed)
CSN: [id number]     Arrival date & time 07/25/15  1504 History   First MD Initiated Contact with Patient 07/25/15 1516     Chief Complaint  Patient presents with  . Hypotension     (Consider location/radiation/quality/duration/timing/severity/associated sxs/prior Treatment) HPI   [name] is a 82 year old gentleman with PMH of Atrial fibrillation/flutter on Xarelto, HTN, CAD, Squamous Cell lung cancer s/p pneumonectomy, atrial myxoma, COPD on continuous 2L , T2DM who presents with hypotension and melena. Patient reports his blood pressure has been varying over the last week and dropping lower than his usual ~140s/90s. His current blood pressure medications include Carvedilol 12.5 mg BID, Diltiazem 180 mg 24h cap daily, Losartan-HCTZ 100-25 mg daily.   He says he has been dizzy all morning when getting up from a lying or seated position. His wife checked his BP and saw a SBP in the 80s. EMS were called and he was noted to be orthostatic with vitals of 112/62 with HR 72 sitting and 96/34 with HR 78 when standing. He says he took his morning blood pressure medications. Patient also reports one week history of black stools. He is on Xarelto for his Afib and avoids aspirin and NSAIDs as he has been advised. His wife says he has been laying in bed more due to decreased energy. He has never had a colonoscopy. He is concerned his cancer has returned.  He denies any bright red blood in his stool, hematemesis, hemoptysis, chest pain, abdominal pain, fevers, chills, myalgias, dysuria,   Past Medical History  Diagnosis Date  . Essential hypertension   . Hypertriglyceridemia   . CAD (coronary artery disease)     a. 06/2005 s/p CABG x1 (VG->RCA) @ time of myxoma resection.  . 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  . Carotid artery disease ([place])     a. 06/2005 s/p R CEA;  b. 10/2013 Carotid U/S: patent RICA, LICA 96-78%;  c. 12/3808 U/S: RICA 17-51%, LICA 02-58%.  . Hearing loss   .  COPD (chronic obstructive pulmonary disease) ([place])   . Atrial flutter with rapid ventricular response ([place])     a. 2007 s/p DCCV following CV surgery;  b. 10/2014->CHA2DS2VASc = 5-->Xarelto.  . Squamous cell lung cancer ([place])     a. s/p L pneumonectomy in 2000.  . Type II diabetes mellitus ([place])   . Scarlet fever     a. ~ 1942.  Marland Kitchen History of pneumonia   . GERD (gastroesophageal reflux disease)   . Daily headache   . Arthritis   . Depression   . On home oxygen therapy     "2L; 24/7" (11/03/2014)  . Stroke [facility])    Past Surgical History  Procedure Laterality Date  . Coronary artery bypass graft  07/12/2005    Dr Servando Snare, CABG x 1 with reverse saphenous vein graft to distal rt coronary artery and excision of left atrial myxoma [Other]  . Pneumonectomy Left 05/03/1998    Dr Servando Snare  . Combined mediastinoscopy and bronchoscopy  05/01/1998    Dr Servando Snare  . Carotid endarterectomy Right 2007  . Cardiac catheterization  06/2005; 01/2010    Archie Endo 10/06/2010; Archie Endo 02/02/2010  . Transesophageal echocardiogram  06/2005    Archie Endo 09/04/2010  . Cardioversion  07/2005    DCC/notes 09/04/2010  . Renal angiogram Bilateral 12/2005    Archie Endo 09/04/2010   Family History  Problem Relation Age of Onset  . Heart disease Mother  NOT before age 67  . Heart attack Mother   . Diabetes Mother   . Hyperlipidemia Mother   . Heart disease Sister   . Cancer Sister     Breast  . Hypertension Sister   . Stomach cancer Father   . Cancer Father     Stomach  . Hyperlipidemia Father   . Hyperlipidemia Brother   . Heart disease Brother    Social History  Substance Use Topics  . Smoking status: Former Smoker -- 2.00 packs/day for 43 years    Types: Cigarettes    Quit date: 07/21/1997  . Smokeless tobacco: Never Used  . Alcohol Use: 0.0 oz/week    0 Standard drinks or equivalent per week     Comment: 11/03/2014 "never drank much; stopped in the early 1970's"    Review of Systems  Constitutional:  Positive for fatigue. Negative for fever and diaphoresis.  Respiratory: Negative for cough, shortness of breath and wheezing.   Cardiovascular: Negative for chest pain, palpitations and leg swelling.  Gastrointestinal: Negative for nausea, vomiting, abdominal pain, diarrhea, constipation and rectal pain.       Black stool.  Genitourinary: Positive for frequency. Negative for dysuria.  Musculoskeletal: Positive for neck pain.  Neurological: Positive for dizziness and light-headedness. Negative for seizures, syncope, speech difficulty and weakness.  Psychiatric/Behavioral: Negative for confusion.      Allergies  Bee venom; Statins; Sulfa antibiotics; and Amoxicillin  Home Medications   Prior to Admission medications   Medication Sig Start Date End Date Taking? Authorizing Provider  acetaminophen (TYLENOL) 325 MG tablet Take 325 mg by mouth every 6 (six) hours as needed for mild pain, moderate pain or headache. For pain   Yes Historical Provider, MD  albuterol (PROVENTIL HFA;VENTOLIN HFA) 108 (90 BASE) MCG/ACT inhaler Inhale 2 puffs into the lungs every 6 (six) hours as needed for wheezing.   Yes Historical Provider, MD  carvedilol (COREG) 12.5 MG tablet Take 1 tablet (12.5 mg total) by mouth 2 (two) times daily with a meal. Patient taking differently: Take 12.5 mg by mouth 2 (two) times daily with a meal. TAEK 2ND DOSE AT 8PM 11/16/14  Yes Jettie Booze, MD  cetirizine (ZYRTEC) 10 MG tablet Take 10 mg by mouth daily as needed for allergies or rhinitis. For allergies   Yes Historical Provider, MD  cholecalciferol (VITAMIN D) 1000 UNITS tablet Take 2,000 Units by mouth 2 (two) times daily.    Yes Historical Provider, MD  diltiazem (CARDIZEM CD) 180 MG 24 hr capsule Take 1 capsule (180 mg total) by mouth daily. 01/11/15  Yes Jettie Booze, MD  EPIPEN 2-PAK 0.3 MG/0.3ML DEVI Inject 0.3 mg into the skin See admin instructions. Take as needed for severe allergic reaction 11/04/11  Yes  Historical Provider, MD  ipratropium-albuterol (DUONEB) 0.5-2.5 (3) MG/3ML SOLN Take 3 mLs by nebulization 3 (three) times daily. 02/27/15  Yes Brand Males, MD  losartan-hydrochlorothiazide (HYZAAR) 100-25 MG per tablet Take 1 tablet by mouth daily. 01/13/15  Yes Jettie Booze, MD  magnesium oxide (MAG-OX) 400 MG tablet Take 400 mg by mouth at bedtime.    Yes Historical Provider, MD  metFORMIN (GLUCOPHAGE) 500 MG tablet Take 500 mg by mouth daily with breakfast.  03/24/14  Yes Historical Provider, MD  Multiple Vitamins-Minerals (MULTIVITAMINS THER. W/MINERALS) TABS Take 1 tablet by mouth daily.   Yes Historical Provider, MD  Omega-3 Fatty Acids (FISH OIL) 1200 MG CAPS Take 1 capsule by mouth 2 (two) times daily.  Yes Historical Provider, MD  pravastatin (PRAVACHOL) 20 MG tablet Take 1 tablet (20 mg total) by mouth every other day. 07/13/15  Yes Isaiah Serge, NP  ranitidine (ZANTAC) 150 MG capsule Take 150 mg by mouth at bedtime as needed for heartburn.    Yes Historical Provider, MD  rivaroxaban (XARELTO) 20 MG TABS tablet Take 1 tablet (20 mg total) by mouth daily with supper. 11/04/14  Yes Erlene Quan, PA-C  triamcinolone cream (KENALOG) 0.1 %  04/18/15  Yes Historical Provider, MD   BP 119/76 mmHg  Pulse 86  Temp(Src) 98.1 F (36.7 C) (Oral)  Resp 19  Wt 96.163 kg  SpO2 100% Physical Exam  Constitutional: He is oriented to person, place, and time. He appears well-nourished. No distress.  Elderly man, resting in bed, pleasant, hard of hearing  HENT:  Head: Normocephalic and atraumatic.  Mouth/Throat: Oropharynx is clear and moist.  Eyes: EOM are normal. Pupils are equal, round, and reactive to light.  Pulmonary/Chest: Effort normal. He exhibits no tenderness.  Distant breath sounds, diminished on left  Abdominal: Soft. Bowel sounds are normal. He exhibits no distension. There is no tenderness.  Genitourinary:  FOBT positive, black stool from rectal exam  Musculoskeletal:  Normal range of motion. He exhibits no tenderness.  Trace pitting edema bilaterally  Neurological: He is alert and oriented to person, place, and time.  Skin: Skin is warm. He is not diaphoretic.  Psychiatric: He has a normal mood and affect.    ED Course  Procedures (including critical care time) Labs Review Labs Reviewed  POC OCCULT BLOOD, ED - Abnormal; Notable for the following:    Fecal Occult Bld POSITIVE (*)    All other components within normal limits  CBC WITH DIFFERENTIAL/PLATELET  COMPREHENSIVE METABOLIC PANEL  BRAIN NATRIURETIC PEPTIDE  TROPONIN I  URINALYSIS, ROUTINE W REFLEX MICROSCOPIC (NOT AT [facility])    Imaging Review Dg Chest 2 View  07/25/2015  CLINICAL DATA:  Shortness of breath with hypotension and dizziness EXAM: CHEST  2 VIEW COMPARISON:  May 25, 2015 FINDINGS: There is evidence of prior pneumonectomy with calcification on the left, probably due to chronic empyema. The right lung is hyperexpanded but clear. The heart size is normal. Pulmonary vascularity on the right is normal. Patient is status post coronary artery bypass grafting. No adenopathy evident. No bone lesions. IMPRESSION: Stable postoperative change on the left with calcification likely indicative of chronic empyema. Right lung clear. No change in cardiac silhouette. Electronically Signed   By: Lowella Grip III M.D.   On: 07/25/2015 16:30   I have personally reviewed and evaluated these images and lab results as part of my medical decision-making.   EKG Interpretation   Date/Time:  Tuesday July 25 2015 15:11:43 EDT Ventricular Rate:  70 PR Interval:  206 QRS Duration: 90 QT Interval:  371 QTC Calculation: 400 R Axis:   69 Text Interpretation:  Sinus rhythm Nonspecific T abnormalities, lateral  leads Atrial fibrillation RESOLVED SINCE PREVIOUS Confirmed by Maryan Rued   MD, Loree Fee ([id number]) on 07/25/2015 3:21:47 PM      MDM   Final diagnoses:  None    82 year old gentleman with PMH of  Atrial fibrillation/flutter on Xarelto, HTN, CAD, Squamous Cell lung cancer s/p pneumonectomy, atrial myxoma, COPD on continuous O2 2L via [place] who presents with orthostatic hypotension and melena for 1 week. He is at risk for GI bleed while on Xarelto and has avoided NSAIDs and aspirin. He denies any BRBPR. He  has black stool on rectal exam and positive FOBT. Patient's orthostasis is likely secondary to upper GI bleed with possible symptomatic anemia.  CBC, CMP, UA are pending. Patient may warrant transfusion of PRBC if significant anemia and possible admission for upper GI evaluation. Have discussed the case with Dr. Sabra Heck who will assume care.    Zada Finders, MD 07/25/15 [place], MD 07/25/15 [phone]

## 2015-07-25 NOTE — H&P (Signed)
[facility] History and Physical  [name]:[id number] DOB: 11-14-1932 DOA: 07/25/2015  Referring physician: EDP PCP:  [name], MD   Chief Complaint: Hypotension   HPI: Calvin [name] is a 82 y.o. male with h/o A.Fib, he is on chronic Xarelto, HTN, CAD, lung ca in past treated with pneumonectomy.  COPD on continuous 2L [place] at baseline.  Patient presents to the ED with low BP that has been varying over the past week.  He also has been having black tarry stools for the past week.  Patient less active and lying in bed due to decreased energy.  BP dropping when getting up to standing position.  BP this morning was in the 80s per wife.  EMS called, patient was orthostatic.  Brought to ED.  Work up in ED: HGB 5.8 down from 12.5 in July of last year, FOBT positive.  Review of Systems: Systems reviewed.  As above, otherwise negative  Past Medical History  Diagnosis Date  . Essential hypertension   . Hypertriglyceridemia   . CAD (coronary artery disease)     a. 06/2005 s/p CABG x1 (VG->RCA) @ time of myxoma resection.  . 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  . Carotid artery disease ([place])     a. 06/2005 s/p R CEA;  b. 10/2013 Carotid U/S: patent RICA, LICA 40-10%;  c. 05/7251 U/S: RICA 66-44%, LICA 03-47%.  . Hearing loss   . COPD (chronic obstructive pulmonary disease) ([place])   . Atrial flutter with rapid ventricular response ([place])     a. 2007 s/p DCCV following CV surgery;  b. 10/2014->CHA2DS2VASc = 5-->Xarelto.  . Squamous cell lung cancer ([place])     a. s/p L pneumonectomy in 2000.  . Type II diabetes mellitus ([place])   . Scarlet fever     a. ~ 1942.  Marland Kitchen History of pneumonia   . GERD (gastroesophageal reflux disease)   . Daily headache   . Arthritis   . Depression   . On home oxygen therapy     "2L; 24/7" (11/03/2014)  . Stroke [facility])    Past Surgical History  Procedure Laterality Date  . Coronary artery bypass graft  07/12/2005    Dr Servando Snare, CABG x 1 with  reverse saphenous vein graft to distal rt coronary artery and excision of left atrial myxoma [Other]  . Pneumonectomy Left 05/03/1998    Dr Servando Snare  . Combined mediastinoscopy and bronchoscopy  05/01/1998    Dr Servando Snare  . Carotid endarterectomy Right 2007  . Cardiac catheterization  06/2005; 01/2010    Archie Endo 10/06/2010; Archie Endo 02/02/2010  . Transesophageal echocardiogram  06/2005    Archie Endo 09/04/2010  . Cardioversion  07/2005    DCC/notes 09/04/2010  . Renal angiogram Bilateral 12/2005    Archie Endo 09/04/2010   Social History:  reports that he quit smoking about 18 years ago. His smoking use included Cigarettes. He has a 86 pack-year smoking history. He has never used smokeless tobacco. He reports that he drinks alcohol. He reports that he does not use illicit drugs.  Allergies  Allergen Reactions  . Bee Venom Anaphylaxis  . Statins Other (See Comments)    Muscle aches  . Sulfa Antibiotics   . Amoxicillin Rash    Family History  Problem Relation Age of Onset  . Heart disease Mother     NOT before age 44  . Heart attack Mother   . Diabetes Mother   . Hyperlipidemia Mother   .  Heart disease Sister   . Cancer Sister     Breast  . Hypertension Sister   . Stomach cancer Father   . Cancer Father     Stomach  . Hyperlipidemia Father   . Hyperlipidemia Brother   . Heart disease Brother      Prior to Admission medications   Medication Sig Start Date End Date Taking? Authorizing Provider  acetaminophen (TYLENOL) 325 MG tablet Take 325 mg by mouth every 6 (six) hours as needed for mild pain, moderate pain or headache. For pain   Yes Historical Provider, MD  albuterol (PROVENTIL HFA;VENTOLIN HFA) 108 (90 BASE) MCG/ACT inhaler Inhale 2 puffs into the lungs every 6 (six) hours as needed for wheezing.   Yes Historical Provider, MD  carvedilol (COREG) 12.5 MG tablet Take 1 tablet (12.5 mg total) by mouth 2 (two) times daily with a meal. Patient taking differently: Take 12.5 mg by mouth 2  (two) times daily with a meal. TAEK 2ND DOSE AT 8PM 11/16/14  Yes Jettie Booze, MD  cetirizine (ZYRTEC) 10 MG tablet Take 10 mg by mouth daily as needed for allergies or rhinitis. For allergies   Yes Historical Provider, MD  cholecalciferol (VITAMIN D) 1000 UNITS tablet Take 2,000 Units by mouth 2 (two) times daily.    Yes Historical Provider, MD  diltiazem (CARDIZEM CD) 180 MG 24 hr capsule Take 1 capsule (180 mg total) by mouth daily. 01/11/15  Yes Jettie Booze, MD  EPIPEN 2-PAK 0.3 MG/0.3ML DEVI Inject 0.3 mg into the skin See admin instructions. Take as needed for severe allergic reaction 11/04/11  Yes Historical Provider, MD  ipratropium-albuterol (DUONEB) 0.5-2.5 (3) MG/3ML SOLN Take 3 mLs by nebulization 3 (three) times daily. 02/27/15  Yes Brand Males, MD  losartan-hydrochlorothiazide (HYZAAR) 100-25 MG per tablet Take 1 tablet by mouth daily. 01/13/15  Yes Jettie Booze, MD  magnesium oxide (MAG-OX) 400 MG tablet Take 400 mg by mouth at bedtime.    Yes Historical Provider, MD  metFORMIN (GLUCOPHAGE) 500 MG tablet Take 500 mg by mouth daily with breakfast.  03/24/14  Yes Historical Provider, MD  Multiple Vitamins-Minerals (MULTIVITAMINS THER. W/MINERALS) TABS Take 1 tablet by mouth daily.   Yes Historical Provider, MD  Omega-3 Fatty Acids (FISH OIL) 1200 MG CAPS Take 1 capsule by mouth 2 (two) times daily.   Yes Historical Provider, MD  pravastatin (PRAVACHOL) 20 MG tablet Take 1 tablet (20 mg total) by mouth every other day. 07/13/15  Yes Isaiah Serge, NP  ranitidine (ZANTAC) 150 MG capsule Take 150 mg by mouth at bedtime as needed for heartburn.    Yes Historical Provider, MD  rivaroxaban (XARELTO) 20 MG TABS tablet Take 1 tablet (20 mg total) by mouth daily with supper. 11/04/14  Yes Erlene Quan, PA-C  triamcinolone cream (KENALOG) 0.1 %  04/18/15  Yes Historical Provider, MD   Physical Exam: Filed Vitals:   07/25/15 1900 07/25/15 1915  BP: 115/47 130/62  Pulse: 78  78  Temp:    Resp: 22 18    BP 130/62 mmHg  Pulse 78  Temp(Src) 98.1 F (36.7 C) (Oral)  Resp 18  Wt 96.163 kg (212 lb)  SpO2 99%  General Appearance:    Alert, oriented, no distress, appears stated age  Head:    Normocephalic, atraumatic  Eyes:    PERRL, EOMI, sclera non-icteric        Nose:   Nares without drainage or epistaxis. Mucosa, turbinates normal  Throat:  Moist mucous membranes. Oropharynx without erythema or exudate.  Neck:   Supple. No carotid bruits.  No thyromegaly.  No lymphadenopathy.   Back:     No CVA tenderness, no spinal tenderness  Lungs:     Clear to auscultation bilaterally, without wheezes, rhonchi or rales  Chest wall:    No tenderness to palpitation  Heart:    Regular rate and rhythm without murmurs, gallops, rubs  Abdomen:     Soft, non-tender, nondistended, normal bowel sounds, no organomegaly  Genitalia:    deferred  Rectal:    deferred  Extremities:   No clubbing, cyanosis or edema.  Pulses:   2+ and symmetric all extremities  Skin:   Skin color, texture, turgor normal, no rashes or lesions  Lymph nodes:   Cervical, supraclavicular, and axillary nodes normal  Neurologic:   CNII-XII intact. Normal strength, sensation and reflexes      throughout    Labs on Admission:  Basic Metabolic Panel:  Recent Labs Lab 07/25/15 1725  NA 139  K 3.7  CL 99*  CO2 32  GLUCOSE 100*  BUN 30*  CREATININE 1.28*  CALCIUM 9.2   Liver Function Tests:  Recent Labs Lab 07/25/15 1725  AST 19  ALT 16*  ALKPHOS 42  BILITOT 0.4  PROT 5.3*  ALBUMIN 3.2*   No results for input(s): LIPASE, AMYLASE in the last 168 hours. No results for input(s): AMMONIA in the last 168 hours. CBC:  Recent Labs Lab 07/25/15 1725  WBC 8.8  NEUTROABS 6.3  HGB 5.8*  HCT 19.1*  MCV 98.5  PLT 160   Cardiac Enzymes:  Recent Labs Lab 07/25/15 1725  TROPONINI 0.08*    BNP (last 3 results) No results for input(s): PROBNP in the last 8760 hours. CBG: No  results for input(s): GLUCAP in the last 168 hours.  Radiological Exams on Admission: Dg Chest 2 View  07/25/2015  CLINICAL DATA:  Shortness of breath with hypotension and dizziness EXAM: CHEST  2 VIEW COMPARISON:  May 25, 2015 FINDINGS: There is evidence of prior pneumonectomy with calcification on the left, probably due to chronic empyema. The right lung is hyperexpanded but clear. The heart size is normal. Pulmonary vascularity on the right is normal. Patient is status post coronary artery bypass grafting. No adenopathy evident. No bone lesions. IMPRESSION: Stable postoperative change on the left with calcification likely indicative of chronic empyema. Right lung clear. No change in cardiac silhouette. Electronically Signed   By: Lowella Grip III M.D.   On: 07/25/2015 16:30    EKG: Independently reviewed.  Assessment/Plan Principal Problem:   UGI bleed Active Problems:   HTN (hypertension)   Diabetes mellitus (HCC)   COPD (chronic obstructive pulmonary disease) (HCC)   Atrial fibrillation (HCC)   Chronic anticoagulation-Xarelto prescribed 10/27/14   1. UGI bleed with melena - 1. IV Protonix 2. Dr. Hilarie Fredrickson with GI has been consulted, upper EGD planned for AM 2. Acute blood loss anemia -  1. 2 units PRBC 2. Recheck CBC in AM 3. Tele monitor 3. COPD - continue home nebs and O2 4. A.Fib - holding Xeralto, continue rate control 5. DM - holding metformin 6. HTN - continue home meds for now, normotensive in ED   Code Status: Full  Family Communication: No family in room Disposition Plan: Admit to inpatient   Time spent: 54 min  GARDNER, JARED M. [facility] Pager [phone]  If 7AM-7PM, please contact the day team taking care of the patient [email or web address]  Password [email or web address] 07/25/2015, 7:38 PM

## 2015-07-25 NOTE — Telephone Encounter (Signed)
NEW MESSAGE   PT wife is calling   Pt missed appt 07/24/15 because he could not walk  Pt wife wants rn to call    Pt c/o BP issue: STAT if pt c/o blurred vision, one-sided weakness or slurred speech  1. What are your last 5 BP readings? 197/90, 113/54 135/51  196/87,  141/54, 109/51  2. Are you having any other symptoms (ex. Dizziness, headache, blurred vision, passed out)? Dizziness, headache, blurred vision,  3. What is your BP issue?  BP has been up and down

## 2015-07-25 NOTE — ED Notes (Signed)
PT arrives via EMS from home. PT reports he has been dizzy all day when "getting up". PT's wife checked his BP at home and the systolic pressure was in the 80s. EMS performed orthostatic pressures and PT was 112/62 with HR 72 sitting and 96/34 with HR 78 when standing. PT wears O2 2L at home. PT complains of neck pain for months. PT's wife told EMS that PT has had black stools for a week.

## 2015-07-25 NOTE — ED Notes (Signed)
PAGED GI/PYRTLE ON CALL

## 2015-07-25 NOTE — ED Notes (Signed)
Contacted blood bank type and screen almost done.  No blood ready at this time.

## 2015-07-25 NOTE — Telephone Encounter (Signed)
Pt's wife states pt cancelled appt 07/24/15 because he did not feel he could walk from the parking lot in to the office because he was weak and short of breath.   Pt's wife advised she should call 911 for transport to ED for further evaluation.

## 2015-07-25 NOTE — Telephone Encounter (Signed)
I spoke with pt's wife with pt's verbal permission.   Pt's wife states pt's BP was 197/90 196/87 on Sunday. Pt's wife states pt's BP reading about 1PM today was 94/54. Pt's wife states pt is complaining of shortness of breath, headache, dizziness, pain/weakness in his legs.

## 2015-07-26 ENCOUNTER — Inpatient Hospital Stay (HOSPITAL_COMMUNITY): Payer: Medicare Other | Admitting: Certified Registered Nurse Anesthetist

## 2015-07-26 ENCOUNTER — Encounter (HOSPITAL_COMMUNITY): Payer: Self-pay | Admitting: General Practice

## 2015-07-26 ENCOUNTER — Encounter (HOSPITAL_COMMUNITY)
Admission: EM | Disposition: A | Discharge status: Home / Self Care | Payer: Self-pay | Source: Home / Self Care | Attending: Internal Medicine

## 2015-07-26 DIAGNOSIS — K922 Gastrointestinal hemorrhage, unspecified: Secondary | ICD-10-CM

## 2015-07-26 DIAGNOSIS — I10 Essential (primary) hypertension: Secondary | ICD-10-CM

## 2015-07-26 DIAGNOSIS — I481 Persistent atrial fibrillation: Secondary | ICD-10-CM

## 2015-07-26 DIAGNOSIS — Z7901 Long term (current) use of anticoagulants: Secondary | ICD-10-CM

## 2015-07-26 HISTORY — PX: ESOPHAGOGASTRODUODENOSCOPY (EGD) WITH PROPOFOL: SHX5813

## 2015-07-26 LAB — CBC
HCT: 24.3 % — ABNORMAL LOW (ref 39.0–52.0)
HEMOGLOBIN: 7.7 g/dL — AB (ref 13.0–17.0)
MCH: 29.7 pg (ref 26.0–34.0)
MCHC: 31.7 g/dL (ref 30.0–36.0)
MCV: 93.8 fL (ref 78.0–100.0)
Platelets: 140 10*3/uL — ABNORMAL LOW (ref 150–400)
RBC: 2.59 MIL/uL — ABNORMAL LOW (ref 4.22–5.81)
RDW: 16.7 % — AB (ref 11.5–15.5)
WBC: 9.2 10*3/uL (ref 4.0–10.5)

## 2015-07-26 LAB — BASIC METABOLIC PANEL
Anion gap: 8 (ref 5–15)
BUN: 22 mg/dL — AB (ref 6–20)
CALCIUM: 8.9 mg/dL (ref 8.9–10.3)
CHLORIDE: 99 mmol/L — AB (ref 101–111)
CO2: 35 mmol/L — ABNORMAL HIGH (ref 22–32)
CREATININE: 1.19 mg/dL (ref 0.61–1.24)
GFR calc non Af Amer: 56 mL/min — ABNORMAL LOW (ref >60)
Glucose, Bld: 105 mg/dL — ABNORMAL HIGH (ref 65–99)
Potassium: 3.7 mmol/L (ref 3.5–5.1)
SODIUM: 142 mmol/L (ref 135–145)

## 2015-07-26 SURGERY — ESOPHAGOGASTRODUODENOSCOPY (EGD) WITH PROPOFOL
Anesthesia: Monitor Anesthesia Care

## 2015-07-26 MED ORDER — VITAMIN D 1000 UNITS PO TABS
2000.0000 [IU] | ORAL_TABLET | Freq: Two times a day (BID) | ORAL | Status: DC
  Administered 2015-07-26 – 2015-07-31 (×10): 2000 [IU] via ORAL
  Filled 2015-07-26 (×11): qty 2

## 2015-07-26 MED ORDER — LACTATED RINGERS IV SOLN
INTRAVENOUS | Status: DC
  Administered 2015-07-26: 1000 mL via INTRAVENOUS

## 2015-07-26 MED ORDER — PROPOFOL 500 MG/50ML IV EMUL
INTRAVENOUS | Status: DC | PRN
  Administered 2015-07-26: 75 ug/kg/min via INTRAVENOUS

## 2015-07-26 MED ORDER — IPRATROPIUM-ALBUTEROL 0.5-2.5 (3) MG/3ML IN SOLN
RESPIRATORY_TRACT | Status: AC
  Filled 2015-07-26: qty 3

## 2015-07-26 MED ORDER — PEG 3350-KCL-NA BICARB-NACL 420 G PO SOLR
4000.0000 mL | Freq: Once | ORAL | Status: AC
  Administered 2015-07-26: 4000 mL via ORAL
  Filled 2015-07-26: qty 4000

## 2015-07-26 MED ORDER — PROPOFOL 10 MG/ML IV BOLUS
INTRAVENOUS | Status: DC | PRN
  Administered 2015-07-26: 20 mg via INTRAVENOUS

## 2015-07-26 MED ORDER — SODIUM CHLORIDE 0.9 % IV SOLN: INTRAVENOUS | Status: DC

## 2015-07-26 MED ORDER — BUTAMBEN-TETRACAINE-BENZOCAINE 2-2-14 % EX AERO
INHALATION_SPRAY | CUTANEOUS | Status: DC | PRN
  Administered 2015-07-26: 1 via TOPICAL

## 2015-07-26 MED ORDER — CETYLPYRIDINIUM CHLORIDE 0.05 % MT LIQD
7.0000 mL | Freq: Two times a day (BID) | OROMUCOSAL | Status: DC
  Administered 2015-07-26 – 2015-07-31 (×7): 7 mL via OROMUCOSAL

## 2015-07-26 NOTE — Transfer of Care (Signed)
Immediate Anesthesia Transfer of Care Note  Patient: Calvin [name]  Procedure(s) Performed: Procedure(s): ESOPHAGOGASTRODUODENOSCOPY (EGD) WITH PROPOFOL (N/A)  Patient Location: Endoscopy Unit  Anesthesia Type:MAC  Level of Consciousness: awake, oriented and patient cooperative  Airway & Oxygen Therapy: Patient Spontanous Breathing and Patient connected to nasal cannula oxygen  Post-op Assessment: Report given to RN, Post -op Vital signs reviewed and stable and Patient moving all extremities  Post vital signs: Reviewed and stable  Last Vitals:  Filed Vitals:   07/26/15 1155 07/26/15 1250  BP: 155/59 98/43  Pulse: 74 69  Temp: 36.7 C   Resp: 19 17    Complications: No apparent anesthesia complications

## 2015-07-26 NOTE — Anesthesia Preprocedure Evaluation (Signed)
Anesthesia Evaluation  Patient identified by MRN, date of birth, ID band Patient awake    Reviewed: Allergy & Precautions, NPO status , Patient's Chart, lab work & pertinent test results, reviewed documented beta blocker date and time   History of Anesthesia Complications Negative for: history of anesthetic complications  Airway Mallampati: II  TM Distance: >3 FB Neck ROM: Full    Dental  (+) Edentulous Upper, Edentulous Lower   Pulmonary shortness of breath, at rest and Long-Term Oxygen Therapy, COPD,  COPD inhaler and oxygen dependent, former smoker,   No left breath sounds   + decreased breath sounds+ wheezing      Cardiovascular hypertension, Pt. on medications and Pt. on home beta blockers + CAD, + CABG and + Peripheral Vascular Disease   Rhythm:Regular     Neuro/Psych  Headaches, Depression TIACVA, No Residual Symptoms    GI/Hepatic Neg liver ROS, GERD  ,  Endo/Other  diabetes, Type 2  Renal/GU Renal InsufficiencyRenal disease     Musculoskeletal  (+) Arthritis ,   Abdominal   Peds  Hematology  (+) anemia ,   Anesthesia Other Findings   Reproductive/Obstetrics                             Anesthesia Physical Anesthesia Plan  ASA: III  Anesthesia Plan: MAC   Post-op Pain Management:    Induction: Intravenous  Airway Management Planned: Natural Airway, Nasal Cannula and Simple Face Mask  Additional Equipment: None  Intra-op Plan:   Post-operative Plan:   Informed Consent: I have reviewed the patients History and Physical, chart, labs and discussed the procedure including the risks, benefits and alternatives for the proposed anesthesia with the patient or authorized representative who has indicated his/her understanding and acceptance.   Dental advisory given  Plan Discussed with: CRNA and Surgeon  Anesthesia Plan Comments:         Anesthesia Quick Evaluation

## 2015-07-26 NOTE — ED Notes (Signed)
Report given to Alleen Borne, RN on 5W.  Advised nurse that Protonix was requested from pharmacy but had not come yet will tube to floor when arrived.  Also advised that I had just called blood bank about units available but units had not been picked up or hung at this point.

## 2015-07-26 NOTE — Consult Note (Signed)
Subjective:   HPI  The patient is an 82 year old male who was admitted to the hospital with complaints of melena for the past week he was found to have a hemoglobin of 5.8. Stool for occult blood positive. Denies abdominal pain or hematemesis. Denies ever having had an ulcer. He has been on Xarelto for chronic atrial fibrillation.  Review of Systems  No chest pain or shortness of breath  Past Medical History  Diagnosis Date  . Essential hypertension   . Hypertriglyceridemia   . CAD (coronary artery disease)     a. 06/2005 s/p CABG x1 (VG->RCA) @ time of myxoma resection.  . 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  . Carotid artery disease ([place])     a. 06/2005 s/p R CEA;  b. 10/2013 Carotid U/S: patent RICA, LICA 03-50%;  c. 0/9381 U/S: RICA 82-99%, LICA 37-16%.  . Hearing loss   . COPD (chronic obstructive pulmonary disease) ([place])   . Atrial flutter with rapid ventricular response ([place])     a. 2007 s/p DCCV following CV surgery;  b. 10/2014->CHA2DS2VASc = 5-->Xarelto.  . Squamous cell lung cancer ([place])     a. s/p L pneumonectomy in 2000.  . Type II diabetes mellitus ([place])   . Scarlet fever     a. ~ 1942.  Marland Kitchen History of pneumonia   . GERD (gastroesophageal reflux disease)   . Daily headache   . Arthritis   . Depression   . On home oxygen therapy     "2L; 24/7" (11/03/2014)  . Stroke ([place])   . GI bleed 07/2015   Past Surgical History  Procedure Laterality Date  . Coronary artery bypass graft  07/12/2005    Dr Servando Snare, CABG x 1 with reverse saphenous vein graft to distal rt coronary artery and excision of left atrial myxoma [Other]  . Pneumonectomy Left 05/03/1998    Dr Servando Snare  . Combined mediastinoscopy and bronchoscopy  05/01/1998    Dr Servando Snare  . Carotid endarterectomy Right 2007  . Cardiac catheterization  06/2005; 01/2010    Archie Endo 10/06/2010; Archie Endo 02/02/2010  . Transesophageal echocardiogram  06/2005    Archie Endo 09/04/2010  . Cardioversion  07/2005   DCC/notes 09/04/2010  . Renal angiogram Bilateral 12/2005    Archie Endo 09/04/2010   Social History   Social History  . Marital Status: Married    Spouse Name: N/A  . Number of Children: N/A  . Years of Education: N/A   Occupational History  . retired    Social History Main Topics  . Smoking status: Former Smoker -- 2.00 packs/day for 43 years    Types: Cigarettes    Quit date: 07/21/1997  . Smokeless tobacco: Never Used  . Alcohol Use: 0.0 oz/week    0 Standard drinks or equivalent per week     Comment: 11/03/2014 "never drank much; stopped in the early 1970's"  . Drug Use: No  . Sexual Activity: Not Currently   Other Topics Concern  . Not on file   Social History Narrative   family history includes Cancer in his father and sister; Diabetes in his mother; Heart attack in his mother; Heart disease in his brother, mother, and sister; Hyperlipidemia in his brother, father, and mother; Hypertension in his sister; Stomach cancer in his father.  Current facility-administered medications:  .  0.9 %  sodium chloride infusion, , Intravenous, Once, Noemi Chapel, MD .  0.9 %  sodium chloride infusion, , Intravenous, STAT, Noemi Chapel,  MD, Last Rate: 100 mL/hr at 07/26/15 0305 .  acetaminophen (TYLENOL) tablet 325 mg, 325 mg, Oral, Q6H PRN, [name], DO .  albuterol (PROVENTIL) (2.5 MG/3ML) 0.083% nebulizer solution 2.5 mg, 2.5 mg, Nebulization, Q6H PRN, [name], DO .  antiseptic oral rinse (CPC / CETYLPYRIDINIUM CHLORIDE 0.05%) solution 7 mL, 7 mL, Mouth Rinse, BID, Jared M Gardner, DO, 7 mL at 07/26/15 1015 .  carvedilol (COREG) tablet 12.5 mg, 12.5 mg, Oral, BID WC, [name], DO, 12.5 mg at 07/26/15 1015 .  diltiazem (CARDIZEM CD) 24 hr capsule 180 mg, 180 mg, Oral, Daily, [name], DO, 180 mg at 07/26/15 1013 .  ipratropium-albuterol (DUONEB) 0.5-2.5 (3) MG/3ML nebulizer solution 3 mL, 3 mL, Nebulization, TID, [name], DO, 3 mL at 07/25/15 2302 .   pantoprazole (PROTONIX) 80 mg in sodium chloride 0.9 % 100 mL IVPB, 80 mg, Intravenous, Q12H, [name], DO, 80 mg at 07/26/15 1016 .  sodium chloride flush (NS) 0.9 % injection 3 mL, 3 mL, Intravenous, Q12H, Jared M Gardner, DO, 10 mL at 07/25/15 2235 Allergies  Allergen Reactions  . Bee Venom Anaphylaxis  . Statins Other (See Comments)    Muscle aches  . Sulfa Antibiotics   . Amoxicillin Rash     Objective:     BP 161/67 mmHg  Pulse 87  Temp(Src) 98.2 F (36.8 C) (Oral)  Resp 20  Ht '5\' 11"'$  (1.803 m)  Wt 92.7 kg (204 lb 5.9 oz)  BMI 28.52 kg/m2  SpO2 100%  Alert and oriented  No acute distress  Heart irregular rhythm  Lungs clear  Abdomen: Bowel sounds present, soft, nontender  Laboratory No components found for: D1    Assessment:     Melena  Blood loss anemia      Plan:     We will plan EGD today. Xarelto has been held. PPI therapy.

## 2015-07-26 NOTE — Anesthesia Postprocedure Evaluation (Signed)
Anesthesia Post Note  Patient: Calvin [name]  Procedure(s) Performed: Procedure(s) (LRB): ESOPHAGOGASTRODUODENOSCOPY (EGD) WITH PROPOFOL (N/A)  Patient location during evaluation: PACU Anesthesia Type: MAC Level of consciousness: awake Pain management: pain level controlled Vital Signs Assessment: post-procedure vital signs reviewed and stable Respiratory status: spontaneous breathing and respiratory function stable Cardiovascular status: stable Postop Assessment: no signs of nausea or vomiting Anesthetic complications: no    Last Vitals:  Filed Vitals:   07/26/15 1329 07/26/15 1445  BP: 119/50 115/49  Pulse: 71 70  Temp: 36.9 C 36.6 C  Resp:  18    Last Pain:  Filed Vitals:   07/26/15 1445  PainSc: 0-No pain                 Zoiee Wimmer

## 2015-07-26 NOTE — Op Note (Signed)
[facility] Patient Name: Calvin [name] Procedure Date : 07/26/2015 MRN: [id number] Attending MD: Wonda Horner , MD Date of Birth: 02-10-1933 CSN: [id number] Age: 82 Admit Type: Inpatient Procedure:                Upper GI endoscopy Indications:              Melena Providers:                Wonda Horner, MD, Kingsley Plan, RN, Cherylynn Ridges, Technician Referring MD:              Medicines:                Propofol per Anesthesia Complications:            No immediate complications. Estimated Blood Loss:     Estimated blood loss: none. Procedure:                Pre-Anesthesia Assessment:                           - Prior to the procedure, a History and Physical                            was performed, and patient medications and                            allergies were reviewed. The patient's tolerance of                            previous anesthesia was also reviewed. The risks                            and benefits of the procedure and the sedation                            options and risks were discussed with the patient.                            All questions were answered, and informed consent                            was obtained. Prior Anticoagulants: The patient has                            taken Xarelto (rivaroxaban), last dose was 2 days                            prior to procedure. ASA Grade Assessment: III - A                            patient with severe systemic disease. After  reviewing the risks and benefits, the patient was                            deemed in satisfactory condition to undergo the                            procedure.                           After obtaining informed consent, the endoscope was                            passed under direct vision. Throughout the                            procedure, the patient's blood pressure, pulse, and   oxygen saturations were monitored continuously. The                            EG-[id number] ([id number]) scope was introduced through the                            mouth, and advanced to the second part of duodenum.                            The upper GI endoscopy was accomplished without                            difficulty. The patient tolerated the procedure                            well. Scope In: Scope Out: Findings:      The examined esophagus was normal.      The entire examined stomach was normal.      The examined duodenum was normal. Impression:               - Normal esophagus.                           - Normal stomach.                           - Normal examined duodenum.                           - No specimens collected. Moderate Sedation:      . Recommendation:           - Resume regular diet.                           - Continue present medications.                           - Recommend colonoscopy Procedure Code(s):        --- Professional ---  09407, Esophagogastroduodenoscopy, flexible,                            transoral; diagnostic, including collection of                            specimen(s) by brushing or washing, when performed                            (separate procedure) Diagnosis Code(s):        --- Professional ---                           K92.1, Melena (includes Hematochezia) CPT copyright 2016 American Medical Association. All rights reserved. The codes documented in this report are preliminary and upon coder review may  be revised to meet current compliance requirements. Anson Fret, MD Wonda Horner, MD 07/26/2015 12:53:10 PM This report has been signed electronically. Number of Addenda: 0

## 2015-07-26 NOTE — Consult Note (Signed)
Patient ID: Calvin [name] MRN: [id number], DOB/AGE: 04-29-1932   Admit date: 07/25/2015   Primary Physician:  [name], MD Primary Cardiologist: Dr. Irish Lack  Pt. Profile:  82 y/o male, followed by Dr. Irish Lack, with a h/o chronic atrial flutter w/ a CHA2DS2 VASc score of 6 (HTN, Age >75, prior CVA and vascular disease), on Xarelto for anticoagulation, who was admitted for symptomatic blood loss anemia in the setting of GIB. Cardiology consulted for recommendations regarding Xarelto continuation.   Also with h/o CAD, carotid artery disease, COPD, prior h/o Lung CA and TIA.   Problem List  Past Medical History  Diagnosis Date  . Essential hypertension   . Hypertriglyceridemia   . CAD (coronary artery disease)     a. 06/2005 s/p CABG x1 (VG->RCA) @ time of myxoma resection.  . 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  . Carotid artery disease ([place])     a. 06/2005 s/p R CEA;  b. 10/2013 Carotid U/S: patent RICA, LICA 95-62%;  c. 04/3084 U/S: RICA 57-84%, LICA 69-62%.  . Hearing loss   . COPD (chronic obstructive pulmonary disease) ([place])   . Atrial flutter with rapid ventricular response ([place])     a. 2007 s/p DCCV following CV surgery;  b. 10/2014->CHA2DS2VASc = 5-->Xarelto.  . Squamous cell lung cancer ([place])     a. s/p L pneumonectomy in 2000.  . Type II diabetes mellitus ([place])   . Scarlet fever     a. ~ 1942.  Marland Kitchen History of pneumonia   . GERD (gastroesophageal reflux disease)   . Daily headache   . Arthritis   . Depression   . On home oxygen therapy     "2L; 24/7" (11/03/2014)  . Stroke ([place])   . GI bleed 07/2015    Past Surgical History  Procedure Laterality Date  . Coronary artery bypass graft  07/12/2005    Dr Servando Snare, CABG x 1 with reverse saphenous vein graft to distal rt coronary artery and excision of left atrial myxoma [Other]  . Pneumonectomy Left 05/03/1998    Dr Servando Snare  . Combined mediastinoscopy and bronchoscopy  05/01/1998    Dr Servando Snare  .  Carotid endarterectomy Right 2007  . Cardiac catheterization  06/2005; 01/2010    Archie Endo 10/06/2010; Archie Endo 02/02/2010  . Transesophageal echocardiogram  06/2005    Archie Endo 09/04/2010  . Cardioversion  07/2005    DCC/notes 09/04/2010  . Renal angiogram Bilateral 12/2005    Archie Endo 09/04/2010     Allergies  Allergies  Allergen Reactions  . Bee Venom Anaphylaxis  . Statins Other (See Comments)    Muscle aches  . Sulfa Antibiotics   . Amoxicillin Rash    HPI  82 y/o male, followed by Dr. Irish Lack, with a h/o chronic atrial flutter w/ a CHA2DS2 VASc score of 6 (HTN, Age >75, prior CVA and vascular disease), on Xarelto for anticoagulation, who was admitted for symptomatic blood loss anemia in the setting of GIB. Presenting symptoms were fatigue, hypotension and melena. Hgb on admit was 5.8. FOBT was positive. He was admitted to the hospitalitis service and treated initially with 2 units of PRBCs. Hgb has improved to 7.7. GI consult pending. Cardiology consult placed for recommendations regarding continuation of Xarelto.   Of note, additional medical problems include CAD s/p CABG x 1 and resection of atrial myxoma in 2007. Last cath in 2011 showed single vessel disease with occluded RCA and patent VG to RCA patent. EF  65%. Last echo 2013 showed normal LVEF of 55-60%. He also has a h/o lung cancer s/p left pneumonectomy for a squamous cell carcinoma10 years ago, COPD on chronic O2 (baseline 2L/min [place]), carotid artery disease s/p right CEA in 2007 and TIA in 2013.    Home Medications  Prior to Admission medications   Medication Sig Start Date End Date Taking? Authorizing Provider  acetaminophen (TYLENOL) 325 MG tablet Take 325 mg by mouth every 6 (six) hours as needed for mild pain, moderate pain or headache. For pain   Yes Historical Provider, MD  albuterol (PROVENTIL HFA;VENTOLIN HFA) 108 (90 BASE) MCG/ACT inhaler Inhale 2 puffs into the lungs every 6 (six) hours as needed for wheezing.   Yes  Historical Provider, MD  carvedilol (COREG) 12.5 MG tablet Take 1 tablet (12.5 mg total) by mouth 2 (two) times daily with a meal. Patient taking differently: Take 12.5 mg by mouth 2 (two) times daily with a meal. TAEK 2ND DOSE AT 8PM 11/16/14  Yes Jettie Booze, MD  cetirizine (ZYRTEC) 10 MG tablet Take 10 mg by mouth daily as needed for allergies or rhinitis. For allergies   Yes Historical Provider, MD  cholecalciferol (VITAMIN D) 1000 UNITS tablet Take 2,000 Units by mouth 2 (two) times daily.    Yes Historical Provider, MD  diltiazem (CARDIZEM CD) 180 MG 24 hr capsule Take 1 capsule (180 mg total) by mouth daily. 01/11/15  Yes Jettie Booze, MD  EPIPEN 2-PAK 0.3 MG/0.3ML DEVI Inject 0.3 mg into the skin See admin instructions. Take as needed for severe allergic reaction 11/04/11  Yes Historical Provider, MD  ipratropium-albuterol (DUONEB) 0.5-2.5 (3) MG/3ML SOLN Take 3 mLs by nebulization 3 (three) times daily. 02/27/15  Yes Brand Males, MD  losartan-hydrochlorothiazide (HYZAAR) 100-25 MG per tablet Take 1 tablet by mouth daily. 01/13/15  Yes Jettie Booze, MD  magnesium oxide (MAG-OX) 400 MG tablet Take 400 mg by mouth at bedtime.    Yes Historical Provider, MD  metFORMIN (GLUCOPHAGE) 500 MG tablet Take 500 mg by mouth daily with breakfast.  03/24/14  Yes Historical Provider, MD  Multiple Vitamins-Minerals (MULTIVITAMINS THER. W/MINERALS) TABS Take 1 tablet by mouth daily.   Yes Historical Provider, MD  Omega-3 Fatty Acids (FISH OIL) 1200 MG CAPS Take 1 capsule by mouth 2 (two) times daily.   Yes Historical Provider, MD  pravastatin (PRAVACHOL) 20 MG tablet Take 1 tablet (20 mg total) by mouth every other day. 07/13/15  Yes Isaiah Serge, NP  ranitidine (ZANTAC) 150 MG capsule Take 150 mg by mouth at bedtime as needed for heartburn.    Yes Historical Provider, MD  rivaroxaban (XARELTO) 20 MG TABS tablet Take 1 tablet (20 mg total) by mouth daily with supper. 11/04/14  Yes Erlene Quan, PA-C  triamcinolone cream (KENALOG) 0.1 %  04/18/15  Yes Historical Provider, MD    Family History  Family History  Problem Relation Age of Onset  . Heart disease Mother     NOT before age 32  . Heart attack Mother   . Diabetes Mother   . Hyperlipidemia Mother   . Heart disease Sister   . Cancer Sister     Breast  . Hypertension Sister   . Stomach cancer Father   . Cancer Father     Stomach  . Hyperlipidemia Father   . Hyperlipidemia Brother   . Heart disease Brother     Social History  Social History   Social History  .  Marital Status: Married    Spouse Name: N/A  . Number of Children: N/A  . Years of Education: N/A   Occupational History  . retired    Social History Main Topics  . Smoking status: Former Smoker -- 2.00 packs/day for 43 years    Types: Cigarettes    Quit date: 07/21/1997  . Smokeless tobacco: Never Used  . Alcohol Use: 0.0 oz/week    0 Standard drinks or equivalent per week     Comment: 11/03/2014 "never drank much; stopped in the early 1970's"  . Drug Use: No  . Sexual Activity: Not Currently   Other Topics Concern  . Not on file   Social History Narrative     Review of Systems General:  No chills, fever, night sweats or weight changes.  Cardiovascular:  No chest pain, dyspnea on exertion, edema, orthopnea, palpitations, paroxysmal nocturnal dyspnea. Dermatological: No rash, lesions/masses Respiratory: No cough, dyspnea Urologic: No hematuria, dysuria Abdominal:   No nausea, vomiting, diarrhea, bright red blood per rectum, melena, or hematemesis Neurologic:  No visual changes, wkns, changes in mental status. All other systems reviewed and are otherwise negative except as noted above.  Physical Exam  Blood pressure 161/67, pulse 87, temperature 98.2 F (36.8 C), temperature source Oral, resp. rate 20, height '5\' 11"'$  (1.803 m), weight 204 lb 5.9 oz (92.7 kg), SpO2 100 %.  General: Pleasant, NAD Psych: Normal affect. Neuro:  Alert and oriented X 3. Moves all extremities spontaneously. HEENT: Normal  Neck: Supple without bruits or JVD. Lungs:  Resp regular and unlabored, CTA. Heart: Irregularly irregular no s3, s4, or murmurs. Abdomen: Soft, non-tender, non-distended, BS + x 4.  Extremities: No clubbing, cyanosis or edema. DP/PT/Radials 2+ and equal bilaterally.  Labs  Troponin (Point of Care Test) No results for input(s): TROPIPOC in the last 72 hours.  Recent Labs  07/25/15 1725  TROPONINI 0.08*   Lab Results  Component Value Date   WBC 9.2 07/26/2015   HGB 7.7* 07/26/2015   HCT 24.3* 07/26/2015   MCV 93.8 07/26/2015   PLT 140* 07/26/2015    Recent Labs Lab 07/25/15 1725 07/26/15 0556  NA 139 142  K 3.7 3.7  CL 99* 99*  CO2 32 35*  BUN 30* 22*  CREATININE 1.28* 1.19  CALCIUM 9.2 8.9  PROT 5.3*  --   BILITOT 0.4  --   ALKPHOS 42  --   ALT 16*  --   AST 19  --   GLUCOSE 100* 105*   Lab Results  Component Value Date   CHOL 238* 05/18/2011   HDL 36* 05/18/2011   LDLCALC 144* 05/18/2011   TRIG 291* 05/18/2011   Lab Results  Component Value Date   DDIMER * 01/19/2010    2.70        AT THE INHOUSE ESTABLISHED CUTOFF VALUE OF 0.48 ug/mL FEU, THIS ASSAY HAS BEEN DOCUMENTED IN THE LITERATURE TO HAVE A SENSITIVITY AND NEGATIVE PREDICTIVE VALUE OF AT LEAST 98 TO 99%.  THE TEST RESULT SHOULD BE CORRELATED WITH AN ASSESSMENT OF THE CLINICAL PROBABILITY OF DVT / VTE.     Radiology/Studies  Dg Chest 2 View  07/25/2015  CLINICAL DATA:  Shortness of breath with hypotension and dizziness EXAM: CHEST  2 VIEW COMPARISON:  May 25, 2015 FINDINGS: There is evidence of prior pneumonectomy with calcification on the left, probably due to chronic empyema. The right lung is hyperexpanded but clear. The heart size is normal. Pulmonary vascularity on the right is  normal. Patient is status post coronary artery bypass grafting. No adenopathy evident. No bone lesions. IMPRESSION: Stable  postoperative change on the left with calcification likely indicative of chronic empyema. Right lung clear. No change in cardiac silhouette. Electronically Signed   By: Lowella Grip III M.D.   On: 07/25/2015 16:30    ECG  Sinus Rhythm Nonspecific T wave abnormalities in lateral leads.    ASSESSMENT AND PLAN  Principal Problem:   UGI bleed Active Problems:   HTN (hypertension)   Diabetes mellitus (HCC)   COPD (chronic obstructive pulmonary disease) ([place])   Atrial fibrillation (HCC)   Chronic anticoagulation-Xarelto prescribed 10/27/14   1. GIB: in the setting of chronic anticoagulation with Xarelto for atrial flutter. FOBT is positive. GI consult pending. Will likely undergo EGD +/- colonoscopy. Continue to hold anticoagulation for now until source of bleed is identified.   2. Symptomatic Blood Loss Anemia: secondary to problem # 1. Hgb improved from 5.8 to 7.7 post transfusion with 2 units of PRBCs. IM managing.   3. Chronic Atrial Flutter: CHA2DS2 VASc score of 6 (HTN, Age >75, prior CVA and vascular disease), however Xarelto no on hold given GIB resulting in severe anemia. MD to see and will give further recommendations.    Signed, Lyda Jester, PA-C 07/26/2015, 11:14 AM  Pager: [phone]  I have examined the patient and reviewed assessment and plan and discussed with patient.  Agree with above as stated.  Rate controlled.  He had been having labile BPs.  This may have been related to bleeding prior to arrival to the hospital.  Hold anticoagulation until EGD.  Will have to determine risk vs. Benefits of long term anticoagulation based on EGD results.  Discussed with patient and wife.   Gwendloyn Forsee S.

## 2015-07-27 ENCOUNTER — Encounter (HOSPITAL_COMMUNITY)
Admission: EM | Disposition: A | Discharge status: Home / Self Care | Payer: Self-pay | Source: Home / Self Care | Attending: Internal Medicine

## 2015-07-27 ENCOUNTER — Encounter (HOSPITAL_COMMUNITY): Payer: Self-pay | Admitting: Gastroenterology

## 2015-07-27 HISTORY — PX: COLONOSCOPY: SHX5424

## 2015-07-27 LAB — HEMOGLOBIN AND HEMATOCRIT, BLOOD
HCT: 25.5 % — ABNORMAL LOW (ref 39.0–52.0)
HEMATOCRIT: 22.5 % — AB (ref 39.0–52.0)
HEMOGLOBIN: 7.8 g/dL — AB (ref 13.0–17.0)
Hemoglobin: 7.1 g/dL — ABNORMAL LOW (ref 13.0–17.0)

## 2015-07-27 LAB — GLUCOSE, CAPILLARY
GLUCOSE-CAPILLARY: 147 mg/dL — AB (ref 65–99)
Glucose-Capillary: 115 mg/dL — ABNORMAL HIGH (ref 65–99)

## 2015-07-27 SURGERY — COLONOSCOPY
Anesthesia: Moderate Sedation

## 2015-07-27 MED ORDER — INSULIN ASPART 100 UNIT/ML ~~LOC~~ SOLN
0.0000 [IU] | Freq: Three times a day (TID) | SUBCUTANEOUS | Status: DC
  Administered 2015-07-27 – 2015-07-31 (×2): 1 [IU] via SUBCUTANEOUS

## 2015-07-27 MED ORDER — INSULIN ASPART 100 UNIT/ML ~~LOC~~ SOLN: 0.0000 [IU] | Freq: Every day | SUBCUTANEOUS | Status: DC

## 2015-07-27 MED ORDER — FENTANYL CITRATE (PF) 100 MCG/2ML IJ SOLN
INTRAMUSCULAR | Status: DC | PRN
  Administered 2015-07-27 (×2): 25 ug via INTRAVENOUS
  Administered 2015-07-27 (×2): 12.5 ug via INTRAVENOUS

## 2015-07-27 MED ORDER — FENTANYL CITRATE (PF) 100 MCG/2ML IJ SOLN
INTRAMUSCULAR | Status: AC
  Filled 2015-07-27: qty 2

## 2015-07-27 MED ORDER — MIDAZOLAM HCL 5 MG/ML IJ SOLN
INTRAMUSCULAR | Status: AC
  Filled 2015-07-27: qty 2

## 2015-07-27 MED ORDER — SODIUM CHLORIDE 0.9 % IV SOLN
INTRAVENOUS | Status: DC
  Administered 2015-07-27: 1000 mL via INTRAVENOUS

## 2015-07-27 MED ORDER — MIDAZOLAM HCL 5 MG/5ML IJ SOLN
INTRAMUSCULAR | Status: DC | PRN
  Administered 2015-07-27: 1 mg via INTRAVENOUS
  Administered 2015-07-27 (×2): 0.5 mg via INTRAVENOUS
  Administered 2015-07-27: 1 mg via INTRAVENOUS

## 2015-07-27 NOTE — Progress Notes (Signed)
[facility] Progress Note                                                                                                                                                                                                                      Patient Demographics:    Calvin [name], is a 82 y.o. male, DOB - 09-27-1932, YJE:[id number]  Admit date - 07/25/2015   Admitting Physician [name], DO  Outpatient Primary MD for the patient is  [name], MD  LOS - 2  Note patient was seen and examined on 07/26/2015, Note was missed in error, [facility] was called for EGD.  Outpatient Specialists: [facility] cardiology, either GI  Chief Complaint  Patient presents with  . Hypotension        Subjective:    [name] today has, No headache, No chest pain, No abdominal pain - No Nausea, No new weakness tingling or numbness, No Cough - SOB. Some intermittent bright red blood per stool.   Assessment  & Plan :     1.Lower GI bleed with evidence of acute blood loss related anemia - receive 2 units of packed RBC transfusion on 07/25/2015, [facility] was consulted underwent unremarkable EGD on 07/26/2015, underwent colonoscopy on 07/27/2015 confirming bleeding cecal angiodysplasia, status post argon plasma coagulation on 07/27/2015 by Dr. [name]. Will commence diet, monitor H&H for another 24 hours. Avoid anticoagulation and antiplatelet agents for now per GI.  2. Chronic atrial flutter. [place] vasc 2 score of 6. Was on xaralto currently on hold, cardiology following, obviously anticoagulation and antiplatelet medications held for #1 above. Does have risk of stroke this was explained to the patient accepts the risk. We basically have no option at this time. If bleeding stable weight resume heparin drip without bolus with caution for 24 hours before starting xaralto.  3. Underlying COPD. Stable. Supportive  care.  4. Essential hypertension. Continue combination of diltiazem and Coreg. Hold ACE inhibitor and diuretic for now.  5. DM type II. On sliding scale.  Lab Results  Component Value Date   HGBA1C 6.0* 05/18/2011   CBG (last 3)  No results for input(s): GLUCAP in the last 72 hours.      Code Status : Full  Family Communication  : None present  Disposition Plan  : Stay inpatient for at least 24 more hours  Barriers For Discharge : Ongoing GI bleed  Consults  :  GI, cardiology  Procedures  :   EGD. Unremarkable  Colonoscopy  confirming bleeding cecal angiodysplasia, status post argon plasma coagulation on 07/27/2015 by Dr. [name]    DVT Prophylaxis  :  SCDs   Lab Results  Component Value Date   PLT 140* 07/26/2015    Antibiotics  :    Anti-infectives    None        Objective:   Filed Vitals:   07/27/15 1055 07/27/15 1105 07/27/15 1115 07/27/15 1125  BP: 123/41 94/33 102/32 141/52  Pulse: 71 68 67 70  Temp:      TempSrc:      Resp: '14 11 11 15  '$ Height:      Weight:      SpO2: 100% 91% 91% 100%    Wt Readings from Last 3 Encounters:  07/25/15 92.7 kg (204 lb 5.9 oz)  07/13/15 96.163 kg (212 lb)  05/25/15 95.709 kg (211 lb)     Intake/Output Summary (Last 24 hours) at 07/27/15 1128 Last data filed at 07/26/15 1246  Gross per 24 hour  Intake    150 ml  Output      0 ml  Net    150 ml     Physical Exam  Awake Alert, Oriented X 3, No new F.N deficits, Normal affect [place].AT,PERRAL Supple Neck,No JVD, No cervical lymphadenopathy appriciated.  Symmetrical Chest wall movement, Good air movement bilaterally, CTAB RRR,No Gallops,Rubs or new Murmurs, No Parasternal Heave +ve B.Sounds, Abd Soft, No tenderness, No organomegaly appriciated, No rebound - guarding or rigidity. No Cyanosis, Clubbing or edema, No new Rash or bruise       Data Review:    CBC  Recent Labs Lab 07/25/15 1725 07/26/15 0556 07/27/15 0827  WBC 8.8 9.2  --   HGB  5.8* 7.7* 7.8*  HCT 19.1* 24.3* 25.5*  PLT 160 140*  --   MCV 98.5 93.8  --   MCH 29.9 29.7  --   MCHC 30.4 31.7  --   RDW 14.2 16.7*  --   LYMPHSABS 1.4  --   --   MONOABS 0.9  --   --   EOSABS 0.2  --   --   BASOSABS 0.0  --   --     Chemistries   Recent Labs Lab 07/25/15 1725 07/26/15 0556  NA 139 142  K 3.7 3.7  CL 99* 99*  CO2 32 35*  GLUCOSE 100* 105*  BUN 30* 22*  CREATININE 1.28* 1.19  CALCIUM 9.2 8.9  AST 19  --   ALT 16*  --   ALKPHOS 42  --   BILITOT 0.4  --    ------------------------------------------------------------------------------------------------------------------ No results for input(s): CHOL, HDL, LDLCALC, TRIG, CHOLHDL, LDLDIRECT in the last 72 hours.  Lab Results  Component Value Date   HGBA1C 6.0* 05/18/2011   ------------------------------------------------------------------------------------------------------------------ No results for input(s): TSH, T4TOTAL, T3FREE, THYROIDAB in the last 72 hours.  Invalid input(s): FREET3 ------------------------------------------------------------------------------------------------------------------ No results for input(s): VITAMINB12, FOLATE, FERRITIN, TIBC, IRON, RETICCTPCT in the last 72 hours.  Coagulation profile No results for input(s): INR, PROTIME in the last 168 hours.  No results for input(s): DDIMER in the last 72 hours.  Cardiac Enzymes  Recent Labs Lab 07/25/15 1725  TROPONINI 0.08*   ------------------------------------------------------------------------------------------------------------------    Component Value Date/Time   BNP 138.4* 07/25/2015 1725    Inpatient Medications  Scheduled Meds: . [MAR Hold] sodium chloride   Intravenous Once  . [MAR Hold] antiseptic oral rinse  7 mL Mouth Rinse BID  . [MAR Hold] carvedilol  12.5 mg  Oral BID WC  . [MAR Hold] cholecalciferol  2,000 Units Oral BID  . [MAR Hold] diltiazem  180 mg Oral Daily  . [MAR Hold]  ipratropium-albuterol  3 mL Nebulization TID  . [MAR Hold] pantoprazole (PROTONIX) IV  80 mg Intravenous Q12H  . [MAR Hold] sodium chloride flush  3 mL Intravenous Q12H   Continuous Infusions: . sodium chloride 1,000 mL (07/27/15 0908)  . lactated ringers 1,000 mL (07/26/15 1159)   PRN Meds:.[MAR Hold] acetaminophen, [MAR Hold] albuterol  Micro Results No results found for this or any previous visit (from the past 240 hour(s)).  Radiology Reports Dg Chest 2 View  07/25/2015  CLINICAL DATA:  Shortness of breath with hypotension and dizziness EXAM: CHEST  2 VIEW COMPARISON:  May 25, 2015 FINDINGS: There is evidence of prior pneumonectomy with calcification on the left, probably due to chronic empyema. The right lung is hyperexpanded but clear. The heart size is normal. Pulmonary vascularity on the right is normal. Patient is status post coronary artery bypass grafting. No adenopathy evident. No bone lesions. IMPRESSION: Stable postoperative change on the left with calcification likely indicative of chronic empyema. Right lung clear. No change in cardiac silhouette. Electronically Signed   By: Lowella Grip III M.D.   On: 07/25/2015 16:30    Time Spent in minutes   35   SINGH,PRASHANT K M.D on 07/27/2015 at 11:28 AM  Between 7am to 7pm - Pager - [phone]  After 7pm go to [email or web address] - password [facility]  [facility] -  Office  [phone]

## 2015-07-27 NOTE — Op Note (Signed)
[facility] Patient Name: Calvin [name] Procedure Date : 07/27/2015 MRN: [id number] Attending MD: Wonda Horner , MD Date of Birth: [birth year] CSN: [id number] Age: 82 Admit Type: Inpatient Procedure:                Colonoscopy Indications:              Hematochezia Providers:                Wonda Horner, MD, Cleda Daub, RN, Ralene Bathe,                            Technician, Cherylynn Ridges, Technician Referring MD:              Medicines:                Fentanyl 75 micrograms IV, Midazolam 3 mg IV Complications:            No immediate complications. Estimated Blood Loss:     . Procedure:                Pre-Anesthesia Assessment:                           - Prior to the procedure, a History and Physical                            was performed, and patient medications and                            allergies were reviewed. The patient's tolerance of                            previous anesthesia was also reviewed. The risks                            and benefits of the procedure and the sedation                            options and risks were discussed with the patient.                            All questions were answered, and informed consent                            was obtained. Prior Anticoagulants: The patient has                            taken Xarelto (rivaroxaban), last dose was 3 days                            prior to procedure. ASA Grade Assessment: III - A                            patient with severe systemic disease. After  reviewing the risks and benefits, the patient was                            deemed in satisfactory condition to undergo the                            procedure.                           After obtaining informed consent, the colonoscope                            was passed under direct vision. Throughout the                            procedure, the patient's blood pressure, pulse, and                             oxygen saturations were monitored continuously. The                            EC-[id number] ([id number]) scope was introduced through                            the anus and advanced to the the cecum, identified                            by appendiceal orifice and ileocecal valve. The                            ileocecal valve and the appendiceal orifice were                            photographed. The colonoscopy was performed without                            difficulty. The patient tolerated the procedure                            well. The quality of the bowel preparation was                            adequate. Scope In: 9:38:05 AM Scope Out: 10:28:57 AM Scope Withdrawal Time: 0 hours 28 minutes 32 seconds  Total Procedure Duration: 0 hours 50 minutes 52 seconds  Findings:      The perianal and digital rectal examinations were normal.      One angiodysplastic lesion with bleeding was found in the cecum. This       was tucked under a fold and difficult to see. Fulguration to stop the       bleeding by argon plasma was successful.      There was blood scattered throughout the colon. Impression:               - One bleeding colonic angiodysplastic lesion.  Treated with argon plasma coagulation (APC).                           - No specimens collected. Moderate Sedation:      Moderate (conscious) sedation was administered by the endoscopy nurse       and supervised by the endoscopist. The following parameters were       monitored: oxygen saturation, heart rate, blood pressure, respiratory       rate, EKG, adequacy of pulmonary ventilation, and response to care. Recommendation:           - Resume regular diet.                           - Continue present medications.                           - Would avoid anticoagulation for now.                           - Repeat colonoscopy is not recommended for                            screening  purposes. Procedure Code(s):        --- Professional ---                           [phone], Colonoscopy, flexible; with control of                            bleeding, any method Diagnosis Code(s):        --- Professional ---                           K55.21, Angiodysplasia of colon with hemorrhage                           K92.1, Melena (includes Hematochezia) CPT copyright 2016 American Medical Association. All rights reserved. The codes documented in this report are preliminary and upon coder review may  be revised to meet current compliance requirements. Anson Fret, MD Wonda Horner, MD 07/27/2015 10:45:27 AM This report has been signed electronically. Number of Addenda: 0

## 2015-07-28 DIAGNOSIS — I480 Paroxysmal atrial fibrillation: Secondary | ICD-10-CM

## 2015-07-28 LAB — CBC
HEMATOCRIT: 22.1 % — AB (ref 39.0–52.0)
Hemoglobin: 6.9 g/dL — CL (ref 13.0–17.0)
MCH: 30 pg (ref 26.0–34.0)
MCHC: 31.2 g/dL (ref 30.0–36.0)
MCV: 96.1 fL (ref 78.0–100.0)
Platelets: 127 10*3/uL — ABNORMAL LOW (ref 150–400)
RBC: 2.3 MIL/uL — ABNORMAL LOW (ref 4.22–5.81)
RDW: 15.8 % — AB (ref 11.5–15.5)
WBC: 7.2 10*3/uL (ref 4.0–10.5)

## 2015-07-28 LAB — GLUCOSE, CAPILLARY
GLUCOSE-CAPILLARY: 98 mg/dL (ref 65–99)
GLUCOSE-CAPILLARY: 95 mg/dL (ref 65–99)
GLUCOSE-CAPILLARY: 103 mg/dL — AB (ref 65–99)
Glucose-Capillary: 91 mg/dL (ref 65–99)
Glucose-Capillary: 114 mg/dL — ABNORMAL HIGH (ref 65–99)

## 2015-07-28 LAB — BASIC METABOLIC PANEL
Anion gap: 5 (ref 5–15)
BUN: 9 mg/dL (ref 6–20)
CALCIUM: 8.9 mg/dL (ref 8.9–10.3)
CO2: 36 mmol/L — AB (ref 22–32)
CREATININE: 1.07 mg/dL (ref 0.61–1.24)
Chloride: 98 mmol/L — ABNORMAL LOW (ref 101–111)
GFR calc Af Amer: >60 mL/min (ref >60)
GFR calc non Af Amer: >60 mL/min (ref >60)
GLUCOSE: 123 mg/dL — AB (ref 65–99)
Potassium: 4.3 mmol/L (ref 3.5–5.1)
Sodium: 139 mmol/L (ref 135–145)

## 2015-07-28 LAB — HEMOGLOBIN A1C
HEMOGLOBIN A1C: 5.6 % (ref 4.8–5.6)
Mean Plasma Glucose: 114 mg/dL

## 2015-07-28 LAB — HEMOGLOBIN AND HEMATOCRIT, BLOOD
HCT: 26 % — ABNORMAL LOW (ref 39.0–52.0)
HEMATOCRIT: 26.4 % — AB (ref 39.0–52.0)
HEMOGLOBIN: 8.2 g/dL — AB (ref 13.0–17.0)
Hemoglobin: 7.9 g/dL — ABNORMAL LOW (ref 13.0–17.0)

## 2015-07-28 LAB — PREPARE RBC (CROSSMATCH)

## 2015-07-28 MED ORDER — FUROSEMIDE 10 MG/ML IJ SOLN
20.0000 mg | Freq: Once | INTRAMUSCULAR | Status: AC
  Administered 2015-07-28: 20 mg via INTRAVENOUS
  Filled 2015-07-28: qty 2

## 2015-07-28 MED ORDER — SODIUM CHLORIDE 0.9 % IV SOLN
Freq: Once | INTRAVENOUS | Status: AC
  Administered 2015-07-28: 04:00:00 via INTRAVENOUS

## 2015-07-28 NOTE — Care Management Important Message (Signed)
Important Message  Patient Details  Name: Calvin [name] MRN: [id number] Date of Birth: [birth year]   Medicare Important Message Given:  Yes    Kobee Medlen Abena 07/28/2015, 10:40 AM

## 2015-07-28 NOTE — Progress Notes (Signed)
Last night patient received blood transfusion but did experiencing leakage when the blood product had finished.  I am unaware if the patient received all of the unit of blood or not.  Patient feels that he did not receive whole unit of blood.  HBG went from 6.9 to 7.8 this morning.  Patient and wife very concerned about HBG.  MD is aware and instructed [place] to continue to monitor stools for blood.  At this time patient has not had any more bloody stools.

## 2015-07-28 NOTE — Progress Notes (Signed)
Patient had a critical Lab result of H/H 6.9/22.1 , Medical personnel on call made aware ,orders given and 1 Unit of RBC   Blood A, RH Positive  Started at 0938 ,blood concert   transfusion verification  verified with an  2nd RN

## 2015-07-28 NOTE — Progress Notes (Signed)
SUBJECTIVE:  Feels ok.  Attempted transfusion last night night but due to leaking, he did not get the whole unit.  OBJECTIVE:   Vitals:   Filed Vitals:   07/28/15 0401 07/28/15 0428 07/28/15 0735 07/28/15 0916  BP: 151/66 133/63 146/66   Pulse: 96 75    Temp: 97.8 F (36.6 C) 97.9 F (36.6 C) 97.8 F (36.6 C)   TempSrc: Oral Oral Oral   Resp: '20 18 18   '$ Height:      Weight:      SpO2: 98% 98% 98% 93%   I&O's:   Intake/Output Summary (Last 24 hours) at 07/28/15 1241 Last data filed at 07/28/15 1045  Gross per 24 hour  Intake 1687.33 ml  Output   1700 ml  Net -12.67 ml   TELEMETRY: Reviewed telemetry pt in NSR:     PHYSICAL EXAM General: Well developed, well nourished, in no acute distress Head:   Normal cephalic and atramatic  Lungs:   Clear bilaterally to auscultation. Heart:   HRRR S1 S2  No JVD.   Abdomen: abdomen soft and non-tender Msk:  Back normal,  Normal strength and tone for age. Extremities:   No edema.   Neuro: Alert and oriented. Psych:  Normal affect, responds appropriately Skin: No rash   LABS: Basic Metabolic Panel:  Recent Labs  07/26/15 0556 07/28/15 0121  NA 142 139  K 3.7 4.3  CL 99* 98*  CO2 35* 36*  GLUCOSE 105* 123*  BUN 22* 9  CREATININE 1.19 1.07  CALCIUM 8.9 8.9   Liver Function Tests:  Recent Labs  07/25/15 1725  AST 19  ALT 16*  ALKPHOS 42  BILITOT 0.4  PROT 5.3*  ALBUMIN 3.2*   No results for input(s): LIPASE, AMYLASE in the last 72 hours. CBC:  Recent Labs  07/25/15 1725 07/26/15 0556  07/28/15 0121 07/28/15 1110  WBC 8.8 9.2  --  7.2  --   NEUTROABS 6.3  --   --   --   --   HGB 5.8* 7.7*  < > 6.9* 7.9*  HCT 19.1* 24.3*  < > 22.1* 26.0*  MCV 98.5 93.8  --  96.1  --   PLT 160 140*  --  127*  --   < > = values in this interval not displayed. Cardiac Enzymes:  Recent Labs  07/25/15 1725  TROPONINI 0.08*   BNP: Invalid input(s): POCBNP D-Dimer: No results for input(s): DDIMER in the last 72  hours. Hemoglobin A1C:  Recent Labs  07/27/15 1212  HGBA1C 5.6   Fasting Lipid Panel: No results for input(s): CHOL, HDL, LDLCALC, TRIG, CHOLHDL, LDLDIRECT in the last 72 hours. Thyroid Function Tests: No results for input(s): TSH, T4TOTAL, T3FREE, THYROIDAB in the last 72 hours.  Invalid input(s): FREET3 Anemia Panel: No results for input(s): VITAMINB12, FOLATE, FERRITIN, TIBC, IRON, RETICCTPCT in the last 72 hours. Coag Panel:   Lab Results  Component Value Date   INR 1.01 09/03/2013   INR 0.94 05/17/2011   INR 0.98 01/20/2010    RADIOLOGY: Dg Chest 2 View  07/25/2015  CLINICAL DATA:  Shortness of breath with hypotension and dizziness EXAM: CHEST  2 VIEW COMPARISON:  May 25, 2015 FINDINGS: There is evidence of prior pneumonectomy with calcification on the left, probably due to chronic empyema. The right lung is hyperexpanded but clear. The heart size is normal. Pulmonary vascularity on the right is normal. Patient is status post coronary artery bypass grafting. No adenopathy evident. No  bone lesions. IMPRESSION: Stable postoperative change on the left with calcification likely indicative of chronic empyema. Right lung clear. No change in cardiac silhouette. Electronically Signed   By: Lowella Grip III M.D.   On: 07/25/2015 16:30      ASSESSMENT: s/p GI bleeed in the setting of AFib  PLAN:  Off of anticoagulation at this time,  Plan to give a trial of IV heparin before trying NOAC.  Patient's wife would feel better on a lower dose of NOAC.  However, this may not provide adequate stroke prevention. D/w Dr. Candiss Norse.  Will check with pharmacy about dosing NOAC given age, weight and renal function.  Maintaining NSR at this time.   Jettie Booze, MD  07/28/2015  12:41 PM

## 2015-07-28 NOTE — Progress Notes (Signed)
[facility] Progress Note  Subjective: The patient is one day status post colonoscopy with APC of bleeding AVM in cecum. States he is still passing some blood. I am not sure if this is old blood which was present in the colon. He has received a blood transfusion.    Objective: Vital signs in last 24 hours: Temp:  [97.7 F (36.5 C)-97.9 F (36.6 C)] 97.8 F (36.6 C) (04/07 0735) Pulse Rate:  [67-96] 75 (04/07 0428) Resp:  [8-23] 18 (04/07 0735) BP: (91-151)/(23-66) 146/66 mmHg (04/07 0735) SpO2:  [49 %-100 %] 93 % (04/07 0916) Weight change:    PE:  No distress  Abdomen soft and nontender  Lab Results: Results for orders placed or performed during the hospital encounter of 07/25/15 (from the past 24 hour(s))  Hemoglobin A1c     Status: None   Collection Time: 07/27/15 12:12 PM  Result Value Ref Range   Hgb A1c MFr Bld 5.6 4.8 - 5.6 %   Mean Plasma Glucose 114 mg/dL  Glucose, capillary     Status: Abnormal   Collection Time: 07/27/15  1:44 PM  Result Value Ref Range   Glucose-Capillary 147 (H) 65 - 99 mg/dL  Glucose, capillary     Status: Abnormal   Collection Time: 07/27/15  4:59 PM  Result Value Ref Range   Glucose-Capillary 115 (H) 65 - 99 mg/dL  Hemoglobin and hematocrit, blood     Status: Abnormal   Collection Time: 07/27/15  6:20 PM  Result Value Ref Range   Hemoglobin 7.1 (L) 13.0 - 17.0 g/dL   HCT 22.5 (L) 39.0 - 52.0 %  Glucose, capillary     Status: None   Collection Time: 07/27/15 11:19 PM  Result Value Ref Range   Glucose-Capillary 91 65 - 99 mg/dL  CBC     Status: Abnormal   Collection Time: 07/28/15  1:21 AM  Result Value Ref Range   WBC 7.2 4.0 - 10.5 K/uL   RBC 2.30 (L) 4.22 - 5.81 MIL/uL   Hemoglobin 6.9 (LL) 13.0 - 17.0 g/dL   HCT 22.1 (L) 39.0 - 52.0 %   MCV 96.1 78.0 - 100.0 fL   MCH 30.0 26.0 - 34.0 pg   MCHC 31.2 30.0 - 36.0 g/dL   RDW 15.8 (H) 11.5 - 15.5 %   Platelets 127 (L) 150 - 400 K/uL  Basic metabolic panel      Status: Abnormal   Collection Time: 07/28/15  1:21 AM  Result Value Ref Range   Sodium 139 135 - 145 mmol/L   Potassium 4.3 3.5 - 5.1 mmol/L   Chloride 98 (L) 101 - 111 mmol/L   CO2 36 (H) 22 - 32 mmol/L   Glucose, Bld 123 (H) 65 - 99 mg/dL   BUN 9 6 - 20 mg/dL   Creatinine, Ser 1.07 0.61 - 1.24 mg/dL   Calcium 8.9 8.9 - 10.3 mg/dL   GFR calc non Af Amer >60 >60 mL/min   GFR calc Af Amer >60 >60 mL/min   Anion gap 5 5 - 15  Prepare RBC     Status: None   Collection Time: 07/28/15  2:05 AM  Result Value Ref Range   Order Confirmation ORDER PROCESSED BY BLOOD BANK   Glucose, capillary     Status: Abnormal   Collection Time: 07/28/15  8:43 AM  Result Value Ref Range   Glucose-Capillary 114 (H) 65 - 99 mg/dL    Studies/Results: No results found.    Assessment:  GI bleeding with findings yesterday of an AVM in cecum which was cauterized with APC  Plan:   Continue to hold anticoagulation. Follow clinically. Transfuse blood as needed. If he has further significant bleeding I would consider doing a nuclear medicine bleeding scan and if positive angiogram with embolization.    Cassell Clement 07/28/2015, 9:34 AM  Pager: [phone] If no answer or after 5 PM call [phone]

## 2015-07-28 NOTE — Care Management Note (Signed)
Case Management Note  Patient Details  Name: Calvin [name] MRN: [id number] Date of Birth: 1932/08/17  Subjective/Objective:                 Independent patient admitted from home with wife for UGI bleed. Receiving transfusion today, had colonoscopy yesterday   Action/Plan:  Anticipate DC to home when medically stable. CM will continue to follow for DC needs Expected Discharge Date:                  Expected Discharge Plan:  Home/Self Care  In-House Referral:     Discharge planning Services  CM Consult  Post Acute Care Choice:    Choice offered to:     DME Arranged:    DME Agency:     HH Arranged:    HH Agency:     Status of Service:  In process, will continue to follow  Medicare Important Message Given:  Yes Date Medicare IM Given:    Medicare IM give by:    Date Additional Medicare IM Given:    Additional Medicare Important Message give by:     If discussed at [place] of Stay Meetings, dates discussed:    Additional Comments:  Carles Collet, RN 07/28/2015, 3:46 PM

## 2015-07-28 NOTE — Progress Notes (Signed)
!   Unit of RBC completed at 0710 no reaction observed pt tolerated well

## 2015-07-28 NOTE — Progress Notes (Addendum)
TRH Progress Note                                                                                                                                                                                                                      Patient Demographics:    Calvin [name], is a 82 y.o. male, DOB - [birth year], KZS:[id number]  Admit date - 07/25/2015   Admitting Physician [name], DO  Outpatient Primary MD for the patient is  [name], MD  LOS - 3  Note patient was seen and examined on 07/26/2015, Note was missed in error, [facility] was called for EGD.  Outpatient Specialists: [facility] cardiology, either GI  Chief Complaint  Patient presents with  . Hypotension        Subjective:    Calvin [name] today has, No headache, No chest pain, No abdominal pain - No Nausea, No new weakness tingling or numbness, No Cough - SOB. Some intermittent bright red blood per stool.   Assessment  & Plan :     1.Lower GI bleed with evidence of acute blood loss related anemia - receive 2 units of packed RBC transfusion on 07/25/2015 and 1 unit of packed RBC on 07/28/2015, [facility] was consulted underwent unremarkable EGD on 07/26/2015, underwent colonoscopy on 07/27/2015 confirming bleeding cecal angiodysplasia, status post argon plasma coagulation on 07/27/2015 by Dr. [name]. Will commence diet, monitor H&H for another 24 hours. Avoid anticoagulation and antiplatelet agents for now per GI. If Bleeding continues he will have a tagged RBC nuclear scan.  2. Chronic atrial flutter. [place] vasc 2 score of 6. Was on xaralto currently on hold, cardiology following, obviously anticoagulation and antiplatelet medications held for #1 above. Does have risk of stroke this was explained to the patient accepts the risk. We basically have no option at this time. If bleeding stable we will resume heparin drip without  bolus with caution for 24 hours before starting xaralto/eliquis.  3. Underlying COPD. Stable. Supportive care.  4. Essential hypertension. Continue combination of diltiazem and Coreg. Hold ACE inhibitor and diuretic for now.  5. DM type II. On sliding scale.  Lab Results  Component Value Date   HGBA1C 5.6 07/27/2015   CBG (last 3)   Recent Labs  07/27/15 1659 07/27/15 2319 07/28/15 0843  GLUCAP 115* 91 114*    Code Status : Full  Family Communication  : None present  Disposition Plan  : Stay inpatient for at least 24 more hours  Barriers  For Discharge : Ongoing GI bleed  Consults  :  GI, cardiology  Procedures  :   EGD. Unremarkable  Colonoscopy confirming bleeding cecal angiodysplasia, status post argon plasma coagulation on 07/27/2015 by Dr. [name]    DVT Prophylaxis  :  SCDs   Lab Results  Component Value Date   PLT 127* 07/28/2015    Antibiotics  :    Anti-infectives    None        Objective:   Filed Vitals:   07/28/15 0401 07/28/15 0428 07/28/15 0735 07/28/15 0916  BP: 151/66 133/63 146/66   Pulse: 96 75    Temp: 97.8 F (36.6 C) 97.9 F (36.6 C) 97.8 F (36.6 C)   TempSrc: Oral Oral Oral   Resp: '20 18 18   '$ Height:      Weight:      SpO2: 98% 98% 98% 93%    Wt Readings from Last 3 Encounters:  07/25/15 92.7 kg (204 lb 5.9 oz)  07/13/15 96.163 kg (212 lb)  05/25/15 95.709 kg (211 lb)     Intake/Output Summary (Last 24 hours) at 07/28/15 1100 Last data filed at 07/28/15 0900  Gross per 24 hour  Intake 1687.33 ml  Output   1400 ml  Net 287.33 ml     Physical Exam  Awake Alert, Oriented X 3, No new F.N deficits, Normal affect [place].AT,PERRAL Supple Neck,No JVD, No cervical lymphadenopathy appriciated.  Symmetrical Chest wall movement, Good air movement bilaterally, CTAB RRR,No Gallops,Rubs or new Murmurs, No Parasternal Heave +ve B.Sounds, Abd Soft, No tenderness, No organomegaly appriciated, No rebound - guarding or  rigidity. No Cyanosis, Clubbing or edema, No new Rash or bruise       Data Review:    CBC  Recent Labs Lab 07/25/15 1725 07/26/15 0556 07/27/15 0827 07/27/15 1820 07/28/15 0121  WBC 8.8 9.2  --   --  7.2  HGB 5.8* 7.7* 7.8* 7.1* 6.9*  HCT 19.1* 24.3* 25.5* 22.5* 22.1*  PLT 160 140*  --   --  127*  MCV 98.5 93.8  --   --  96.1  MCH 29.9 29.7  --   --  30.0  MCHC 30.4 31.7  --   --  31.2  RDW 14.2 16.7*  --   --  15.8*  LYMPHSABS 1.4  --   --   --   --   MONOABS 0.9  --   --   --   --   EOSABS 0.2  --   --   --   --   BASOSABS 0.0  --   --   --   --     Chemistries   Recent Labs Lab 07/25/15 1725 07/26/15 0556 07/28/15 0121  NA 139 142 139  K 3.7 3.7 4.3  CL 99* 99* 98*  CO2 32 35* 36*  GLUCOSE 100* 105* 123*  BUN 30* 22* 9  CREATININE 1.28* 1.19 1.07  CALCIUM 9.2 8.9 8.9  AST 19  --   --   ALT 16*  --   --   ALKPHOS 42  --   --   BILITOT 0.4  --   --    ------------------------------------------------------------------------------------------------------------------ No results for input(s): CHOL, HDL, LDLCALC, TRIG, CHOLHDL, LDLDIRECT in the last 72 hours.  Lab Results  Component Value Date   HGBA1C 5.6 07/27/2015   ------------------------------------------------------------------------------------------------------------------ No results for input(s): TSH, T4TOTAL, T3FREE, THYROIDAB in the last 72 hours.  Invalid input(s): FREET3 ------------------------------------------------------------------------------------------------------------------ No results for input(s):  VITAMINB12, FOLATE, FERRITIN, TIBC, IRON, RETICCTPCT in the last 72 hours.  Coagulation profile No results for input(s): INR, PROTIME in the last 168 hours.  No results for input(s): DDIMER in the last 72 hours.  Cardiac Enzymes  Recent Labs Lab 07/25/15 1725  TROPONINI 0.08*    ------------------------------------------------------------------------------------------------------------------    Component Value Date/Time   BNP 138.4* 07/25/2015 1725    Inpatient Medications  Scheduled Meds: . sodium chloride   Intravenous Once  . antiseptic oral rinse  7 mL Mouth Rinse BID  . carvedilol  12.5 mg Oral BID WC  . cholecalciferol  2,000 Units Oral BID  . diltiazem  180 mg Oral Daily  . insulin aspart  0-5 Units Subcutaneous QHS  . insulin aspart  0-9 Units Subcutaneous TID WC  . ipratropium-albuterol  3 mL Nebulization TID  . pantoprazole (PROTONIX) IV  80 mg Intravenous Q12H  . sodium chloride flush  3 mL Intravenous Q12H   Continuous Infusions: . lactated ringers 1,000 mL (07/26/15 1159)   PRN Meds:.acetaminophen, albuterol  Micro Results No results found for this or any previous visit (from the past 240 hour(s)).  Radiology Reports Dg Chest 2 View  07/25/2015  CLINICAL DATA:  Shortness of breath with hypotension and dizziness EXAM: CHEST  2 VIEW COMPARISON:  May 25, 2015 FINDINGS: There is evidence of prior pneumonectomy with calcification on the left, probably due to chronic empyema. The right lung is hyperexpanded but clear. The heart size is normal. Pulmonary vascularity on the right is normal. Patient is status post coronary artery bypass grafting. No adenopathy evident. No bone lesions. IMPRESSION: Stable postoperative change on the left with calcification likely indicative of chronic empyema. Right lung clear. No change in cardiac silhouette. Electronically Signed   By: Lowella Grip III M.D.   On: 07/25/2015 16:30    Time Spent in minutes   35   SINGH,PRASHANT K M.D on 07/28/2015 at 11:00 AM  Between 7am to 7pm - Pager - [phone]  After 7pm go to [email or web address] - password [facility]  [facility] -  Office  [phone]

## 2015-07-29 LAB — TYPE AND SCREEN
ABO/RH(D): A POS
Antibody Screen: NEGATIVE
Unit division: 0
Unit division: 0
Unit division: 0

## 2015-07-29 LAB — HEMOGLOBIN AND HEMATOCRIT, BLOOD
HCT: 26.3 % — ABNORMAL LOW (ref 39.0–52.0)
Hemoglobin: 8 g/dL — ABNORMAL LOW (ref 13.0–17.0)

## 2015-07-29 LAB — GLUCOSE, CAPILLARY
GLUCOSE-CAPILLARY: 116 mg/dL — AB (ref 65–99)
GLUCOSE-CAPILLARY: 120 mg/dL — AB (ref 65–99)
Glucose-Capillary: 105 mg/dL — ABNORMAL HIGH (ref 65–99)
Glucose-Capillary: 124 mg/dL — ABNORMAL HIGH (ref 65–99)

## 2015-07-29 MED ORDER — HEPARIN (PORCINE) IN NACL 100-0.45 UNIT/ML-% IJ SOLN
1300.0000 [IU]/h | INTRAMUSCULAR | Status: DC
  Administered 2015-07-30 (×2): 1300 [IU]/h via INTRAVENOUS
  Filled 2015-07-29 (×2): qty 250

## 2015-07-29 NOTE — Progress Notes (Signed)
[facility] Progress Note                                                                                                                                                                                                                      Patient Demographics:    Calvin [name], is a 82 y.o. male, DOB - 22-Oct-1932, LZJ:[id number]  Admit date - 07/25/2015   Admitting Physician [name], DO  Outpatient Primary MD for the patient is  [name], MD  LOS - 4  Note patient was seen and examined on 07/26/2015, Note was missed in error, [facility] was called for EGD.  Outpatient Specialists: [facility] cardiology, either GI  Chief Complaint  Patient presents with  . Hypotension        Subjective:    Calvin [name] today has, No headache, No chest pain, No abdominal pain - No Nausea, No new weakness tingling or numbness, No Cough - SOB.    Assessment  & Plan :     1.Lower GI bleed with evidence of acute blood loss related anemia - receive 2 units of packed RBC transfusion on 07/25/2015 and 1 unit of packed RBC on 07/28/2015, [facility] was consulted underwent unremarkable EGD on 07/26/2015, underwent colonoscopy on 07/27/2015 confirming bleeding cecal angiodysplasia, status post argon plasma coagulation on 07/27/2015 by Dr. [name]. Will commence diet, monitor H&H for another 24 hours. Avoid anticoagulation and antiplatelet agents for now per GI. If Bleeding continues he will have a tagged RBC nuclear scan.  2. Chronic atrial flutter. [place] vasc 2 score of 6. Was on xaralto currently on hold, cardiology following, obviously anticoagulation and antiplatelet medications held for #1 above. Does have risk of stroke this was explained to the patient accepts the risk. We basically have no option at this time. If bleeding stable we will start heparin drip without bolus with caution on 07-30-15 for 24 hours  before starting xaralto/eliquis.  3. Underlying COPD. Stable. Supportive care.  4. Essential hypertension. Continue combination of diltiazem and Coreg. Hold ACE inhibitor and diuretic for now.  5. DM type II. On sliding scale.  Lab Results  Component Value Date   HGBA1C 5.6 07/27/2015   CBG (last 3)   Recent Labs  07/28/15 1722 07/28/15 2118 07/29/15 0737  GLUCAP 95 103* 105*    Code Status : Full  Family Communication  : None present  Disposition Plan  : Stay inpatient for at least 24 more hours  Barriers For Discharge : Ongoing  GI bleed  Consults  :  GI, cardiology  Procedures  :   EGD. Unremarkable  Colonoscopy confirming bleeding cecal angiodysplasia, status post argon plasma coagulation on 07/27/2015 by Dr. [name]    DVT Prophylaxis  :  SCDs   Lab Results  Component Value Date   PLT 127* 07/28/2015    Antibiotics  :    Anti-infectives    None        Objective:   Filed Vitals:   07/28/15 1727 07/28/15 2114 07/29/15 0531 07/29/15 0922  BP: 132/56 123/50 129/63   Pulse: 67 60 74 72  Temp:  98.4 F (36.9 C) 98.2 F (36.8 C)   TempSrc:  Oral Oral   Resp:  '19 17 18  '$ Height:      Weight:      SpO2:  100% 97% 97%    Wt Readings from Last 3 Encounters:  07/25/15 92.7 kg (204 lb 5.9 oz)  07/13/15 96.163 kg (212 lb)  05/25/15 95.709 kg (211 lb)     Intake/Output Summary (Last 24 hours) at 07/29/15 1122 Last data filed at 07/29/15 0936  Gross per 24 hour  Intake 898.17 ml  Output   1700 ml  Net -801.83 ml     Physical Exam  Awake Alert, Oriented X 3, No new F.N deficits, Normal affect [place].AT,PERRAL Supple Neck,No JVD, No cervical lymphadenopathy appriciated.  Symmetrical Chest wall movement, Good air movement bilaterally, CTAB RRR,No Gallops,Rubs or new Murmurs, No Parasternal Heave +ve B.Sounds, Abd Soft, No tenderness, No organomegaly appriciated, No rebound - guarding or rigidity. No Cyanosis, Clubbing or edema, No new Rash or  bruise       Data Review:    CBC  Recent Labs Lab 07/25/15 1725 07/26/15 0556  07/27/15 1820 07/28/15 0121 07/28/15 1110 07/28/15 1852 07/29/15 0243  WBC 8.8 9.2  --   --  7.2  --   --   --   HGB 5.8* 7.7*  < > 7.1* 6.9* 7.9* 8.2* 8.0*  HCT 19.1* 24.3*  < > 22.5* 22.1* 26.0* 26.4* 26.3*  PLT 160 140*  --   --  127*  --   --   --   MCV 98.5 93.8  --   --  96.1  --   --   --   MCH 29.9 29.7  --   --  30.0  --   --   --   MCHC 30.4 31.7  --   --  31.2  --   --   --   RDW 14.2 16.7*  --   --  15.8*  --   --   --   LYMPHSABS 1.4  --   --   --   --   --   --   --   MONOABS 0.9  --   --   --   --   --   --   --   EOSABS 0.2  --   --   --   --   --   --   --   BASOSABS 0.0  --   --   --   --   --   --   --   < > = values in this interval not displayed.  Chemistries   Recent Labs Lab 07/25/15 1725 07/26/15 0556 07/28/15 0121  NA 139 142 139  K 3.7 3.7 4.3  CL 99* 99* 98*  CO2 32 35* 36*  GLUCOSE 100* 105* 123*  BUN 30* 22* 9  CREATININE 1.28* 1.19 1.07  CALCIUM 9.2 8.9 8.9  AST 19  --   --   ALT 16*  --   --   ALKPHOS 42  --   --   BILITOT 0.4  --   --    ------------------------------------------------------------------------------------------------------------------ No results for input(s): CHOL, HDL, LDLCALC, TRIG, CHOLHDL, LDLDIRECT in the last 72 hours.  Lab Results  Component Value Date   HGBA1C 5.6 07/27/2015   ------------------------------------------------------------------------------------------------------------------ No results for input(s): TSH, T4TOTAL, T3FREE, THYROIDAB in the last 72 hours.  Invalid input(s): FREET3 ------------------------------------------------------------------------------------------------------------------ No results for input(s): VITAMINB12, FOLATE, FERRITIN, TIBC, IRON, RETICCTPCT in the last 72 hours.  Coagulation profile No results for input(s): INR, PROTIME in the last 168 hours.  No results for input(s): DDIMER  in the last 72 hours.  Cardiac Enzymes  Recent Labs Lab 07/25/15 1725  TROPONINI 0.08*   ------------------------------------------------------------------------------------------------------------------    Component Value Date/Time   BNP 138.4* 07/25/2015 1725    Inpatient Medications  Scheduled Meds: . sodium chloride   Intravenous Once  . antiseptic oral rinse  7 mL Mouth Rinse BID  . carvedilol  12.5 mg Oral BID WC  . cholecalciferol  2,000 Units Oral BID  . diltiazem  180 mg Oral Daily  . insulin aspart  0-5 Units Subcutaneous QHS  . insulin aspart  0-9 Units Subcutaneous TID WC  . ipratropium-albuterol  3 mL Nebulization TID  . pantoprazole (PROTONIX) IV  80 mg Intravenous Q12H  . sodium chloride flush  3 mL Intravenous Q12H   Continuous Infusions: . lactated ringers 1,000 mL (07/26/15 1159)   PRN Meds:.acetaminophen, albuterol  Micro Results No results found for this or any previous visit (from the past 240 hour(s)).  Radiology Reports Dg Chest 2 View  07/25/2015  CLINICAL DATA:  Shortness of breath with hypotension and dizziness EXAM: CHEST  2 VIEW COMPARISON:  May 25, 2015 FINDINGS: There is evidence of prior pneumonectomy with calcification on the left, probably due to chronic empyema. The right lung is hyperexpanded but clear. The heart size is normal. Pulmonary vascularity on the right is normal. Patient is status post coronary artery bypass grafting. No adenopathy evident. No bone lesions. IMPRESSION: Stable postoperative change on the left with calcification likely indicative of chronic empyema. Right lung clear. No change in cardiac silhouette. Electronically Signed   By: Lowella Grip III M.D.   On: 07/25/2015 16:30    Time Spent in minutes   35   SINGH,PRASHANT K M.D on 07/29/2015 at 11:22 AM  Between 7am to 7pm - Pager - [phone]  After 7pm go to [email or web address] - password [email or web address]  [facility] -  Office  [phone]

## 2015-07-29 NOTE — Progress Notes (Addendum)
SUBJECTIVE:   82 yo with hx of atrial fib,  On Xarelto - admitted with GI bleed. Also has hx of HTN, CAD, lung cancer, COPD ( on home O2)    OBJECTIVE:   Vitals:   Filed Vitals:   07/28/15 1727 07/28/15 2114 07/29/15 0531 07/29/15 0922  BP: 132/56 123/50 129/63   Pulse: 67 60 74 72  Temp:  98.4 F (36.9 C) 98.2 F (36.8 C)   TempSrc:  Oral Oral   Resp:  '19 17 18  '$ Height:      Weight:      SpO2:  100% 97% 97%   I&O's:    Intake/Output Summary (Last 24 hours) at 07/29/15 1034 Last data filed at 07/29/15 0936  Gross per 24 hour  Intake 898.17 ml  Output   2000 ml  Net -1101.83 ml   TELEMETRY: Reviewed telemetry pt in NSR:     PHYSICAL EXAM General: Well developed, well nourished, in no acute distress Head:   Normal cephalic and atramatic  Lungs:   Clear bilaterally to auscultation. Heart:   HRRR S1 S2  No JVD.   Abdomen: abdomen soft and non-tender Msk:  Back normal,  Normal strength and tone for age. Extremities:   No edema.   Neuro: Alert and oriented. Psych:  Normal affect, responds appropriately Skin: No rash   LABS: Basic Metabolic Panel:  Recent Labs  07/28/15 0121  NA 139  K 4.3  CL 98*  CO2 36*  GLUCOSE 123*  BUN 9  CREATININE 1.07  CALCIUM 8.9   Liver Function Tests: No results for input(s): AST, ALT, ALKPHOS, BILITOT, PROT, ALBUMIN in the last 72 hours. No results for input(s): LIPASE, AMYLASE in the last 72 hours. CBC:  Recent Labs  07/28/15 0121  07/28/15 1852 07/29/15 0243  WBC 7.2  --   --   --   HGB 6.9*  < > 8.2* 8.0*  HCT 22.1*  < > 26.4* 26.3*  MCV 96.1  --   --   --   PLT 127*  --   --   --   < > = values in this interval not displayed. Cardiac Enzymes: No results for input(s): CKTOTAL, CKMB, CKMBINDEX, TROPONINI in the last 72 hours. BNP: Invalid input(s): POCBNP D-Dimer: No results for input(s): DDIMER in the last 72 hours. Hemoglobin A1C:  Recent Labs  07/27/15 1212  HGBA1C 5.6   Fasting Lipid Panel: No  results for input(s): CHOL, HDL, LDLCALC, TRIG, CHOLHDL, LDLDIRECT in the last 72 hours. Thyroid Function Tests: No results for input(s): TSH, T4TOTAL, T3FREE, THYROIDAB in the last 72 hours.  Invalid input(s): FREET3 Anemia Panel: No results for input(s): VITAMINB12, FOLATE, FERRITIN, TIBC, IRON, RETICCTPCT in the last 72 hours. Coag Panel:   Lab Results  Component Value Date   INR 1.01 09/03/2013   INR 0.94 05/17/2011   INR 0.98 01/20/2010    RADIOLOGY: Dg Chest 2 View  07/25/2015  CLINICAL DATA:  Shortness of breath with hypotension and dizziness EXAM: CHEST  2 VIEW COMPARISON:  May 25, 2015 FINDINGS: There is evidence of prior pneumonectomy with calcification on the left, probably due to chronic empyema. The right lung is hyperexpanded but clear. The heart size is normal. Pulmonary vascularity on the right is normal. Patient is status post coronary artery bypass grafting. No adenopathy evident. No bone lesions. IMPRESSION: Stable postoperative change on the left with calcification likely indicative of chronic empyema. Right lung clear. No change in cardiac silhouette. Electronically  Signed   By: Lowella Grip III M.D.   On: 07/25/2015 16:30      ASSESSMENT: s/p GI bleeed in the setting of AFib 1. Atrial fib - rate is well controlled Would favor starting heparin first then NOAC  And converting  a day or so later  - will defer to Dr. Candiss Norse and GI Watchman may be an option.   2. GI bleed - likely due to a colon lesion that was cauterized  Hopefully , this will stop his bleeding     Nahser, Wonda Cheng, MD  07/29/2015 10:48 AM    [address],  [place], [place]  [address] Pager [phone] Phone: [phone]; Fax: [phone]   [facility]  [address] [place], [place]  [address] [phone]   Fax [phone]

## 2015-07-29 NOTE — Progress Notes (Signed)
Scheduled neb not given, bed alarm went off. Pt up using urinal NT at bedside. PRN neb will be given if needed

## 2015-07-29 NOTE — Progress Notes (Signed)
ANTICOAGULATION CONSULT NOTE - Initial Consult  Pharmacy Consult for Heparin while Xarelto held Indication: atrial fibrillation  Allergies  Allergen Reactions  . Bee Venom Anaphylaxis  . Statins Other (See Comments)    Muscle aches  . Sulfa Antibiotics   . Amoxicillin Rash    Patient Measurements: Height: '5\' 11"'$  (180.3 cm) Weight: 204 lb 5.9 oz (92.7 kg) IBW/kg (Calculated) : 75.3  Vital Signs: Temp: 98.2 F (36.8 C) (04/08 0531) Temp Source: Oral (04/08 0531) BP: 129/63 mmHg (04/08 0531) Pulse Rate: 72 (04/08 0922)  Labs:  Recent Labs  07/28/15 0121 07/28/15 1110 07/28/15 1852 07/29/15 0243  HGB 6.9* 7.9* 8.2* 8.0*  HCT 22.1* 26.0* 26.4* 26.3*  PLT 127*  --   --   --   CREATININE 1.07  --   --   --     Estimated Creatinine Clearance: 64.1 mL/min (by C-G formula based on Cr of 1.07).   Medical History: Past Medical History  Diagnosis Date  . Essential hypertension   . Hypertriglyceridemia   . CAD (coronary artery disease)     a. 06/2005 s/p CABG x1 (VG->RCA) @ time of myxoma resection.  . 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  . Carotid artery disease ([place])     a. 06/2005 s/p R CEA;  b. 10/2013 Carotid U/S: patent RICA, LICA 23-55%;  c. 10/3218 U/S: RICA 25-42%, LICA 70-62%.  . Hearing loss   . COPD (chronic obstructive pulmonary disease) ([place])   . Atrial flutter with rapid ventricular response ([place])     a. 2007 s/p DCCV following CV surgery;  b. 10/2014->CHA2DS2VASc = 5-->Xarelto.  . Squamous cell lung cancer ([place])     a. s/p L pneumonectomy in 2000.  . Type II diabetes mellitus ([place])   . Scarlet fever     a. ~ 1942.  Marland Kitchen History of pneumonia   . GERD (gastroesophageal reflux disease)   . Daily headache   . Arthritis   . Depression   . On home oxygen therapy     "2L; 24/7" (11/03/2014)  . Stroke ([place])   . GI bleed 07/2015     Assessment: 82 year old male admitted with GI bleed on Xarelto PTA for Afib.  Now to begin heparin  4/9.  Goal of Therapy:  Heparin level 0.3-0.7 units/ml Monitor platelets by anticoagulation protocol: Yes   Plan:  Heparin at 1300 units / hr starting tomorrow at 7 am 8 hour heparin level Daily heparin level, CBC  Thank you. Anette Guarneri, PharmD [phone]  Tad Moore 07/29/2015,1:15 PM

## 2015-07-29 NOTE — Progress Notes (Signed)
[facility] PROGRESS NOTE Subjective patient 2 days past colonoscopy for G.I. bleeding with fulgaration of AVM cecum.  Objective: Vital signs in last 24 hours: Temp:  [98.2 F (36.8 C)-98.4 F (36.9 C)] 98.2 F (36.8 C) (04/08 0531) Pulse Rate:  [60-74] 72 (04/08 0922) Resp:  [16-19] 18 (04/08 0922) BP: (112-132)/(50-63) 129/63 mmHg (04/08 0531) SpO2:  [95 %-100 %] 97 % (04/08 0922) Last BM Date: 07/28/15  Intake/Output from previous day: 04/07 0701 - 04/08 0700 In: 1158.2 [P.O.:840; I.V.:218.2; IV Piggyback:100] Out: 2300 [Urine:2300] Intake/Output this shift: Total I/O In: 100 [P.O.:100] Out: -   PE: General-- no acute distress  Abdomen-- soft completely nontender  Lab Results:  Recent Labs  07/27/15 1820 07/28/15 0121 07/28/15 1110 07/28/15 1852 07/29/15 0243  WBC  --  7.2  --   --   --   HGB 7.1* 6.9* 7.9* 8.2* 8.0*  HCT 22.5* 22.1* 26.0* 26.4* 26.3*  PLT  --  127*  --   --   --    BMET  Recent Labs  07/28/15 0121  NA 139  K 4.3  CL 98*  CO2 36*  CREATININE 1.07   LFT No results for input(s): PROT, AST, ALT, ALKPHOS, BILITOT, BILIDIR, IBILI in the last 72 hours. PT/INR No results for input(s): LABPROT, INR in the last 72 hours. PANCREAS No results for input(s): LIPASE in the last 72 hours.       Studies/Results: No results found.  Medications: I have reviewed the patient's current medications.  Assessment/Plan: 1. G.I. bleed. 2 days post fulgaration cecal AVM with APC. Hopefully this will scar down and he will not have any further bleeding. Would slowly resume anticoagulation. Well need to monitor carefully for signs of recurrent bleeding.   Malakie Balis JR,Ellysa Parrack L 07/29/2015, 1:09 PM  This note was created using voice recognition software. Minor errors may Have occurred unintentionally.  Pager: [phone] If no answer or after hours call [phone]

## 2015-07-30 LAB — GLUCOSE, CAPILLARY
GLUCOSE-CAPILLARY: 112 mg/dL — AB (ref 65–99)
GLUCOSE-CAPILLARY: 114 mg/dL — AB (ref 65–99)
GLUCOSE-CAPILLARY: 148 mg/dL — AB (ref 65–99)
Glucose-Capillary: 121 mg/dL — ABNORMAL HIGH (ref 65–99)

## 2015-07-30 LAB — HEMOGLOBIN AND HEMATOCRIT, BLOOD
HCT: 28.2 % — ABNORMAL LOW (ref 39.0–52.0)
HEMOGLOBIN: 8.3 g/dL — AB (ref 13.0–17.0)

## 2015-07-30 LAB — HEPARIN LEVEL (UNFRACTIONATED): Heparin Unfractionated: 0.41 IU/mL (ref 0.30–0.70)

## 2015-07-30 MED ORDER — PANTOPRAZOLE SODIUM 40 MG PO TBEC
80.0000 mg | DELAYED_RELEASE_TABLET | Freq: Two times a day (BID) | ORAL | Status: DC
  Administered 2015-07-30 – 2015-07-31 (×2): 80 mg via ORAL
  Filled 2015-07-30 (×2): qty 2

## 2015-07-30 NOTE — Progress Notes (Signed)
SUBJECTIVE:   82 yo with hx of atrial fib,  On Xarelto - admitted with GI bleed. Also has hx of HTN, CAD, lung cancer, COPD ( on home O2)    OBJECTIVE:   Vitals:   Filed Vitals:   07/29/15 1347 07/29/15 1500 07/29/15 2256 07/30/15 0400  BP: 136/53  130/41 131/49  Pulse: 57  63 59  Temp: 98.9 F (37.2 C)  98.3 F (36.8 C) 98.2 F (36.8 C)  TempSrc: Oral  Oral Oral  Resp: '19  18 18  '$ Height:      Weight:      SpO2: 99% 98% 99% 99%   I&O's:    Intake/Output Summary (Last 24 hours) at 07/30/15 0948 Last data filed at 07/30/15 0657  Gross per 24 hour  Intake    283 ml  Output    825 ml  Net   -542 ml   TELEMETRY: Reviewed telemetry pt in NSR:     PHYSICAL EXAM General: Well developed, well nourished, in no acute distress Head:   Normal cephalic and atramatic  Lungs:   Clear bilaterally to auscultation. Heart:   HRRR S1 S2  No JVD.   Abdomen: abdomen soft and non-tender Msk:  Back normal,  Normal strength and tone for age. Extremities:   No edema.   Neuro: Alert and oriented. Psych:  Normal affect, responds appropriately Skin: No rash   LABS: Basic Metabolic Panel:  Recent Labs  07/28/15 0121  NA 139  K 4.3  CL 98*  CO2 36*  GLUCOSE 123*  BUN 9  CREATININE 1.07  CALCIUM 8.9   Liver Function Tests: No results for input(s): AST, ALT, ALKPHOS, BILITOT, PROT, ALBUMIN in the last 72 hours. No results for input(s): LIPASE, AMYLASE in the last 72 hours. CBC:  Recent Labs  07/28/15 0121  07/29/15 0243 07/30/15 0739  WBC 7.2  --   --   --   HGB 6.9*  < > 8.0* 8.3*  HCT 22.1*  < > 26.3* 28.2*  MCV 96.1  --   --   --   PLT 127*  --   --   --   < > = values in this interval not displayed. Cardiac Enzymes: No results for input(s): CKTOTAL, CKMB, CKMBINDEX, TROPONINI in the last 72 hours. BNP: Invalid input(s): POCBNP D-Dimer: No results for input(s): DDIMER in the last 72 hours. Hemoglobin A1C:  Recent Labs  07/27/15 1212  HGBA1C 5.6    Fasting Lipid Panel: No results for input(s): CHOL, HDL, LDLCALC, TRIG, CHOLHDL, LDLDIRECT in the last 72 hours. Thyroid Function Tests: No results for input(s): TSH, T4TOTAL, T3FREE, THYROIDAB in the last 72 hours.  Invalid input(s): FREET3 Anemia Panel: No results for input(s): VITAMINB12, FOLATE, FERRITIN, TIBC, IRON, RETICCTPCT in the last 72 hours. Coag Panel:   Lab Results  Component Value Date   INR 1.01 09/03/2013   INR 0.94 05/17/2011   INR 0.98 01/20/2010    RADIOLOGY: Dg Chest 2 View  07/25/2015  CLINICAL DATA:  Shortness of breath with hypotension and dizziness EXAM: CHEST  2 VIEW COMPARISON:  May 25, 2015 FINDINGS: There is evidence of prior pneumonectomy with calcification on the left, probably due to chronic empyema. The right lung is hyperexpanded but clear. The heart size is normal. Pulmonary vascularity on the right is normal. Patient is status post coronary artery bypass grafting. No adenopathy evident. No bone lesions. IMPRESSION: Stable postoperative change on the left with calcification likely indicative of chronic empyema.  Right lung clear. No change in cardiac silhouette. Electronically Signed   By: Lowella Grip III M.D.   On: 07/25/2015 16:30      ASSESSMENT: s/p GI bleeed in the setting of AFib 1. Atrial fib - rate is well controlled No bleeding so far on heparin . Would start  NOAC today or tomorrow    2. GI bleed - likely due to a colon lesion that was cauterized  Hopefully , this will stop his bleeding   Will sign off.  Call for questions.    Tristain Daily, Wonda Cheng, MD  07/30/2015 9:48 AM    [place] [place],  [place], [place]  [address] Pager [phone] Phone: [phone]; Fax: [phone]   [facility]  [address] [place], [place]  [address] [phone]   Fax [phone]

## 2015-07-30 NOTE — Progress Notes (Signed)
[facility] Progress Note                                                                                                                                                                                                                      Patient Demographics:    Calvin [name], is a 82 y.o. male, DOB - 1932-07-02, UYQ:[id number]  Admit date - 07/25/2015   Admitting Physician [name], DO  Outpatient Primary MD for the patient is  [name], MD  LOS - 5  Note patient was seen and examined on 07/26/2015, Note was missed in error, [facility] was called for EGD.  Outpatient Specialists: [facility] cardiology, either GI  Chief Complaint  Patient presents with  . Hypotension        Subjective:    [name] today has, No headache, No chest pain, No abdominal pain - No Nausea, No new weakness tingling or numbness, No Cough - SOB.    Assessment  & Plan :     1.Lower GI bleed with evidence of acute blood loss related anemia - receive 2 units of packed RBC transfusion on 07/25/2015 and 1 unit of packed RBC on 07/28/2015, [facility] was consulted underwent unremarkable EGD on 07/26/2015, underwent colonoscopy on 07/27/2015 confirming bleeding cecal angiodysplasia, status post argon plasma coagulation on 07/27/2015 by Dr. [name]. Will commence diet, monitor H&H for another 24 hours. Avoid anticoagulation and antiplatelet agents for now per GI. If Bleeding continues he will have a tagged RBC nuclear scan.  2. Chronic atrial flutter. [place] vasc 2 score of 6. Was on xaralto currently on hold, cardiology following, obviously anticoagulation and antiplatelet medications held for #1 above. Does have risk of stroke this was explained to the patient accepts the risk. We basically have no option at this time. We have started heparin drip without bolus with caution on 07-30-15 for 24 hours If H&H remains stable  will start on Eliquis on 07/31/2015.  3. Underlying COPD. Stable. Supportive care.  4. Essential hypertension. Continue combination of diltiazem and Coreg. Hold ACE inhibitor and diuretic for now.  5. DM type II. On sliding scale.  Lab Results  Component Value Date   HGBA1C 5.6 07/27/2015   CBG (last 3)   Recent Labs  07/29/15 1655 07/29/15 2253 07/30/15 0755  GLUCAP 120* 124* 112*    Code Status : Full  Family Communication  : wife  Disposition Plan  : Stay inpatient for at least 24 more hours  Barriers For  Discharge : Ongoing GI bleed  Consults  :  GI, cardiology  Procedures  :   EGD. Unremarkable  Colonoscopy confirming bleeding cecal angiodysplasia, status post argon plasma coagulation on 07/27/2015 by Dr. [name]    DVT Prophylaxis  :  SCDs   Lab Results  Component Value Date   PLT 127* 07/28/2015    Antibiotics  :    Anti-infectives    None        Objective:   Filed Vitals:   07/29/15 1347 07/29/15 1500 07/29/15 2256 07/30/15 0400  BP: 136/53  130/41 131/49  Pulse: 57  63 59  Temp: 98.9 F (37.2 C)  98.3 F (36.8 C) 98.2 F (36.8 C)  TempSrc: Oral  Oral Oral  Resp: '19  18 18  '$ Height:      Weight:      SpO2: 99% 98% 99% 99%    Wt Readings from Last 3 Encounters:  07/25/15 92.7 kg (204 lb 5.9 oz)  07/13/15 96.163 kg (212 lb)  05/25/15 95.709 kg (211 lb)     Intake/Output Summary (Last 24 hours) at 07/30/15 1019 Last data filed at 07/30/15 0953  Gross per 24 hour  Intake    403 ml  Output    825 ml  Net   -422 ml     Physical Exam  Awake Alert, Oriented X 3, No new F.N deficits, Normal affect Reedy.AT,PERRAL Supple Neck,No JVD, No cervical lymphadenopathy appriciated.  Symmetrical Chest wall movement, Good air movement bilaterally, CTAB RRR,No Gallops,Rubs or new Murmurs, No Parasternal Heave +ve B.Sounds, Abd Soft, No tenderness, No organomegaly appriciated, No rebound - guarding or rigidity. No Cyanosis, Clubbing or edema,  No new Rash or bruise       Data Review:    CBC  Recent Labs Lab 07/25/15 1725 07/26/15 0556  07/28/15 0121 07/28/15 1110 07/28/15 1852 07/29/15 0243 07/30/15 0739  WBC 8.8 9.2  --  7.2  --   --   --   --   HGB 5.8* 7.7*  < > 6.9* 7.9* 8.2* 8.0* 8.3*  HCT 19.1* 24.3*  < > 22.1* 26.0* 26.4* 26.3* 28.2*  PLT 160 140*  --  127*  --   --   --   --   MCV 98.5 93.8  --  96.1  --   --   --   --   MCH 29.9 29.7  --  30.0  --   --   --   --   MCHC 30.4 31.7  --  31.2  --   --   --   --   RDW 14.2 16.7*  --  15.8*  --   --   --   --   LYMPHSABS 1.4  --   --   --   --   --   --   --   MONOABS 0.9  --   --   --   --   --   --   --   EOSABS 0.2  --   --   --   --   --   --   --   BASOSABS 0.0  --   --   --   --   --   --   --   < > = values in this interval not displayed.  Chemistries   Recent Labs Lab 07/25/15 1725 07/26/15 0556 07/28/15 0121  NA 139 142 139  K 3.7 3.7 4.3  CL 99*  99* 98*  CO2 32 35* 36*  GLUCOSE 100* 105* 123*  BUN 30* 22* 9  CREATININE 1.28* 1.19 1.07  CALCIUM 9.2 8.9 8.9  AST 19  --   --   ALT 16*  --   --   ALKPHOS 42  --   --   BILITOT 0.4  --   --    ------------------------------------------------------------------------------------------------------------------ No results for input(s): CHOL, HDL, LDLCALC, TRIG, CHOLHDL, LDLDIRECT in the last 72 hours.  Lab Results  Component Value Date   HGBA1C 5.6 07/27/2015   ------------------------------------------------------------------------------------------------------------------ No results for input(s): TSH, T4TOTAL, T3FREE, THYROIDAB in the last 72 hours.  Invalid input(s): FREET3 ------------------------------------------------------------------------------------------------------------------ No results for input(s): VITAMINB12, FOLATE, FERRITIN, TIBC, IRON, RETICCTPCT in the last 72 hours.  Coagulation profile No results for input(s): INR, PROTIME in the last 168 hours.  No results for  input(s): DDIMER in the last 72 hours.  Cardiac Enzymes  Recent Labs Lab 07/25/15 1725  TROPONINI 0.08*   ------------------------------------------------------------------------------------------------------------------    Component Value Date/Time   BNP 138.4* 07/25/2015 1725    Inpatient Medications  Scheduled Meds: . sodium chloride   Intravenous Once  . antiseptic oral rinse  7 mL Mouth Rinse BID  . carvedilol  12.5 mg Oral BID WC  . cholecalciferol  2,000 Units Oral BID  . diltiazem  180 mg Oral Daily  . insulin aspart  0-5 Units Subcutaneous QHS  . insulin aspart  0-9 Units Subcutaneous TID WC  . ipratropium-albuterol  3 mL Nebulization TID  . pantoprazole (PROTONIX) IV  80 mg Intravenous Q12H  . sodium chloride flush  3 mL Intravenous Q12H   Continuous Infusions: . heparin 1,300 Units/hr (07/30/15 9735)  . lactated ringers 1,000 mL (07/26/15 1159)   PRN Meds:.acetaminophen, albuterol  Micro Results No results found for this or any previous visit (from the past 240 hour(s)).  Radiology Reports Dg Chest 2 View  07/25/2015  CLINICAL DATA:  Shortness of breath with hypotension and dizziness EXAM: CHEST  2 VIEW COMPARISON:  May 25, 2015 FINDINGS: There is evidence of prior pneumonectomy with calcification on the left, probably due to chronic empyema. The right lung is hyperexpanded but clear. The heart size is normal. Pulmonary vascularity on the right is normal. Patient is status post coronary artery bypass grafting. No adenopathy evident. No bone lesions. IMPRESSION: Stable postoperative change on the left with calcification likely indicative of chronic empyema. Right lung clear. No change in cardiac silhouette. Electronically Signed   By: Lowella Grip III M.D.   On: 07/25/2015 16:30    Time Spent in minutes   35   Ahjanae Cassel K M.D on 07/30/2015 at 10:19 AM  Between 7am to 7pm - Pager - [phone]  After 7pm go to [email or web address] - password  [facility]  [facility] -  Office  [phone]

## 2015-07-30 NOTE — Progress Notes (Signed)
ANTICOAGULATION CONSULT NOTE - Follow Up Consult  Pharmacy Consult for Heparin Indication: atrial fibrillation  Allergies  Allergen Reactions  . Bee Venom Anaphylaxis  . Statins Other (See Comments)    Muscle aches  . Sulfa Antibiotics   . Amoxicillin Rash    Patient Measurements: Height: '5\' 11"'$  (180.3 cm) Weight: 204 lb 5.9 oz (92.7 kg) IBW/kg (Calculated) : 75.3  Vital Signs: Temp: 98.6 F (37 C) (04/09 1403) Temp Source: Oral (04/09 1403) BP: 114/46 mmHg (04/09 1403) Pulse Rate: 68 (04/09 1403)  Labs:  Recent Labs  07/28/15 0121  07/28/15 1852 07/29/15 0243 07/30/15 0739 07/30/15 1504  HGB 6.9*  < > 8.2* 8.0* 8.3*  --   HCT 22.1*  < > 26.4* 26.3* 28.2*  --   PLT 127*  --   --   --   --   --   HEPARINUNFRC  --   --   --   --   --  0.41  CREATININE 1.07  --   --   --   --   --   < > = values in this interval not displayed.  Estimated Creatinine Clearance: 64.1 mL/min (by C-G formula based on Cr of 1.07).   Medications:  Heparin @ 1300 units/hr  Assessment: 82yom on xarelto pta for afib, admitted with GI bleed. Xarelto held and he was started on IV heparin this morning. Initial heparin level is therapeutic at 0.41. Hgb low but stable. Noted plan to change to apixaban tomorrow.  Goal of Therapy:  Heparin level 0.3-0.7 units/ml Monitor platelets by anticoagulation protocol: Yes   Plan:  1) Continue heparin at 1300 units/hr 2) Follow up daily heparin level and CBC  Deboraha Sprang 07/30/2015,4:11 PM

## 2015-07-30 NOTE — Progress Notes (Signed)
[facility] PROGRESS NOTE Subjective patient without bowel movements are signs of any further bleeding. This is been on a heparin drip. Plan is to change to Eliquis if he has no further bleeding by tomorrow.  Objective: Vital signs in last 24 hours: Temp:  [98.2 F (36.8 C)-98.9 F (37.2 C)] 98.2 F (36.8 C) (04/09 0400) Pulse Rate:  [57-63] 59 (04/09 0400) Resp:  [18-19] 18 (04/09 0400) BP: (130-136)/(41-53) 131/49 mmHg (04/09 0400) SpO2:  [98 %-99 %] 99 % (04/09 0400) Last BM Date: 07/28/15  Intake/Output from previous day: 04/08 0701 - 04/09 0700 In: 383 [P.O.:280; I.V.:3; IV Piggyback:100] Out: 825 [Urine:825] Intake/Output this shift: Total I/O In: 120 [P.O.:120] Out: -   PE: General-- alert and oriented no distress  Abdomen-- soft completely nontender  Lab Results:  Recent Labs  07/28/15 0121 07/28/15 1110 07/28/15 1852 07/29/15 0243 07/30/15 0739  WBC 7.2  --   --   --   --   HGB 6.9* 7.9* 8.2* 8.0* 8.3*  HCT 22.1* 26.0* 26.4* 26.3* 28.2*  PLT 127*  --   --   --   --    BMET  Recent Labs  07/28/15 0121  NA 139  K 4.3  CL 98*  CO2 36*  CREATININE 1.07   LFT No results for input(s): PROT, AST, ALT, ALKPHOS, BILITOT, BILIDIR, IBILI in the last 72 hours. PT/INR No results for input(s): LABPROT, INR in the last 72 hours. PANCREAS No results for input(s): LIPASE in the last 72 hours.       Studies/Results: No results found.  Medications: I have reviewed the patient's current medications.  Assessment/Plan: 1. G.I. bleed. Stable aftertreatment of cecal AVM 3 days ago. Anticoagulation is slowly being resumed due to high risk of strokes from atrial fibrillation. We will be available if there is further bleeding please give [place] a call.   Song Myre JR,Taron Conrey L 07/30/2015, 10:25 AM  This note was created using voice recognition software. Minor errors may Have occurred unintentionally.  Pager: [phone] If no answer or after hours  call [phone]

## 2015-07-31 ENCOUNTER — Encounter (HOSPITAL_COMMUNITY): Payer: Self-pay | Admitting: Gastroenterology

## 2015-07-31 LAB — CBC
HEMATOCRIT: 27.5 % — AB (ref 39.0–52.0)
HEMOGLOBIN: 8.1 g/dL — AB (ref 13.0–17.0)
MCH: 28.7 pg (ref 26.0–34.0)
MCHC: 29.5 g/dL — ABNORMAL LOW (ref 30.0–36.0)
MCV: 97.5 fL (ref 78.0–100.0)
Platelets: 151 10*3/uL (ref 150–400)
RBC: 2.82 MIL/uL — ABNORMAL LOW (ref 4.22–5.81)
RDW: 14.5 % (ref 11.5–15.5)
WBC: 8.3 10*3/uL (ref 4.0–10.5)

## 2015-07-31 LAB — GLUCOSE, CAPILLARY
Glucose-Capillary: 102 mg/dL — ABNORMAL HIGH (ref 65–99)
Glucose-Capillary: 121 mg/dL — ABNORMAL HIGH (ref 65–99)

## 2015-07-31 LAB — HEPARIN LEVEL (UNFRACTIONATED): HEPARIN UNFRACTIONATED: 0.52 [IU]/mL (ref 0.30–0.70)

## 2015-07-31 MED ORDER — POLYETHYLENE GLYCOL 3350 17 G PO PACK: 17.0000 g | PACK | Freq: Two times a day (BID) | ORAL | Status: AC

## 2015-07-31 MED ORDER — DOCUSATE SODIUM 100 MG PO CAPS: 200.0000 mg | ORAL_CAPSULE | Freq: Every day | ORAL | Status: AC

## 2015-07-31 MED ORDER — APIXABAN 5 MG PO TABS
5.0000 mg | ORAL_TABLET | Freq: Two times a day (BID) | ORAL | Status: DC
  Administered 2015-07-31: 5 mg via ORAL
  Filled 2015-07-31: qty 1

## 2015-07-31 MED ORDER — APIXABAN 5 MG PO TABS: 5.0000 mg | ORAL_TABLET | Freq: Two times a day (BID) | ORAL | Status: AC

## 2015-07-31 MED ORDER — PANTOPRAZOLE SODIUM 40 MG PO TBEC: 40.0000 mg | DELAYED_RELEASE_TABLET | Freq: Two times a day (BID) | ORAL | Status: DC

## 2015-07-31 MED ORDER — DOCUSATE SODIUM 100 MG PO CAPS
200.0000 mg | ORAL_CAPSULE | Freq: Two times a day (BID) | ORAL | Status: DC
  Administered 2015-07-31: 200 mg via ORAL
  Filled 2015-07-31: qty 2

## 2015-07-31 MED ORDER — BISACODYL 10 MG RE SUPP
10.0000 mg | Freq: Every day | RECTAL | Status: DC
  Administered 2015-07-31: 10 mg via RECTAL
  Filled 2015-07-31: qty 1

## 2015-07-31 MED ORDER — POLYETHYLENE GLYCOL 3350 17 G PO PACK
17.0000 g | PACK | Freq: Two times a day (BID) | ORAL | Status: DC
Start: 2015-07-31 — End: 2015-07-31
  Administered 2015-07-31: 17 g via ORAL
  Filled 2015-07-31: qty 1

## 2015-07-31 NOTE — Care Management Note (Signed)
Case Management Note  Patient Details  Name: SION REINDERS MRN: [id number] Date of Birth: 08-Jan-1933  Subjective/Objective:                 Independent patient from home admitted with UGI bleed, was on Xaralto per wife. Spoke with patent and wife at the bedside. Provided with Eliquis card and instructions for use. Questions answered, patient and wife verbalized understanding.   Action/Plan:  DC to home with wife today. No further CM needs.  Expected Discharge Date:                  Expected Discharge Plan:  Home/Self Care  In-House Referral:     Discharge planning Services  CM Consult, Medication Assistance  Post Acute Care Choice:    Choice offered to:     DME Arranged:    DME Agency:     HH Arranged:    [place] Agency:     Status of Service:  Completed, signed off  Medicare Important Message Given:  Yes Date Medicare IM Given:    Medicare IM give by:    Date Additional Medicare IM Given:    Additional Medicare Important Message give by:     If discussed at [place] of Stay Meetings, dates discussed:    Additional Comments:  Carles Collet, RN 07/31/2015, 10:09 AM

## 2015-07-31 NOTE — Progress Notes (Signed)
ANTICOAGULATION CONSULT NOTE - Follow Up Consult  Pharmacy Consult Initial for Apixaban  Indication: atrial fibrillation  Allergies  Allergen Reactions  . Bee Venom Anaphylaxis  . Statins Other (See Comments)    Muscle aches  . Sulfa Antibiotics   . Amoxicillin Rash    Patient Measurements: Height: '5\' 11"'$  (180.3 cm) Weight: 204 lb 5.9 oz (92.7 kg) IBW/kg (Calculated) : 75.3  Vital Signs: Temp: 97.8 F (36.6 C) (04/10 0513) Temp Source: Oral (04/10 0513) BP: 123/54 mmHg (04/10 0513) Pulse Rate: 63 (04/10 0513)  Labs:  Recent Labs  07/29/15 0243 07/30/15 0739 07/30/15 1504 07/31/15 0542 07/31/15 0543  HGB 8.0* 8.3*  --   --  8.1*  HCT 26.3* 28.2*  --   --  27.5*  PLT  --   --   --   --  151  HEPARINUNFRC  --   --  0.41 0.52  --     Estimated Creatinine Clearance: 64.1 mL/min (by C-G formula based on Cr of 1.07).   Medications:  Heparin @ 1300 units/hr  Assessment: 82yom on xarelto pta for afib, admitted with GI bleed. Xarelto held and he was started on IV heparin this morning. Initial heparin level is therapeutic at 0.41. CBC stable, heparin has been discontinued and pharmacy has been consulted to transition to Apixaban.    Goal of Therapy:  Heparin level 0.3-0.7 units/ml Monitor platelets by anticoagulation protocol: Yes   Plan:  1) Start Apixaban 5 mg BID first dose now 2) Discontinue heparin associated labs   Duayne Cal 07/31/2015,7:33 AM

## 2015-07-31 NOTE — Progress Notes (Signed)
Pharmacist Provided - Patient Medication Education Prior to Discharge   Calvin [name] is an 82 y.o. male who presented to [facility] on 07/25/2015 with a chief complaint of  Chief Complaint  Patient presents with  . Hypotension     '[x]'$  Patient will be discharged with 4 new medications  The following medications were discussed with the patient: Eliquis, docusate, pantoprazole, Miralax.  Pain Control medications: '[]'$  Yes    '[x]'$  No  Diabetes Medications: '[]'$  Yes    '[x]'$  No  Heart Failure Medications: '[]'$  Yes    '[x]'$  No  Anticoagulation Medications:  '[x]'$  Yes    '[]'$  No  Antibiotics at discharge: '[]'$  Yes    '[x]'$  No  Allergy Assessment Completed and Updated: '[x]'$  Yes    '[]'$  No Identified Patient Allergies:  Allergies  Allergen Reactions  . Bee Venom Anaphylaxis  . Statins Other (See Comments)    Muscle aches  . Sulfa Antibiotics   . Amoxicillin Rash     Medication Adherence Assessment: '[]'$  Excellent (no doses missed/week)      '[x]'$  Good (1 dose missed/week)      '[]'$  Partial (2-3 doses missed/week)      '[]'$  Poor (>3 doses missed/week)  Barriers to Obtaining Medications: '[]'$  Yes '[x]'$  No   Assessment: Spoke with patient and his wife, who helps with his medications. I explained his new medications, spending extra time focusing on Eliquis. The patient's wife is clear to not give Xarelto anymore and that the patient's medication has been changed. I discussed bleeding side effects including signs of bleeding, to be evaluated if he has a fall with a head injury and that it is a twice daily medication. I answered all their questions. Their main concern right now is if he will have an oxygen tank at discharge so they can get home safely. Respiratory therapy is currently working on this aspect and I reassured the patient and his wife that we would not send him out without something that he needs to safely get home.   Time spent preparing for discharge counseling: 10 min Time spent counseling patient: 15  min  Governor Specking, PharmD Clinical Pharmacy Resident Pager: [phone] 07/31/2015, 1:47 PM

## 2015-07-31 NOTE — Discharge Instructions (Signed)
Follow with Primary MD  [name], MD in 2 days   Get CBC, CMP, 2 view Chest X ray checked  by Primary MD next visit.    Activity: As tolerated with Full fall precautions use walker/cane & assistance as needed   Disposition Home    Diet:   Heart Healthy Low Carb.  For Heart failure patients - Check your Weight same time everyday, if you gain over 2 pounds, or you develop in leg swelling, experience more shortness of breath or chest pain, call your Primary MD immediately. Follow Cardiac Low Salt Diet and 1.5 lit/day fluid restriction.   On your next visit with your primary care physician please Get Medicines reviewed and adjusted.   Please request your Prim.MD to go over all Hospital Tests and Procedure/Radiological results at the follow up, please get all Hospital records sent to your Prim MD by signing hospital release before you go home.   If you experience worsening of your admission symptoms, develop shortness of breath, life threatening emergency, suicidal or homicidal thoughts you must seek medical attention immediately by calling 911 or calling your MD immediately  if symptoms less severe.  You Must read complete instructions/literature along with all the possible adverse reactions/side effects for all the Medicines you take and that have been prescribed to you. Take any new Medicines after you have completely understood and accpet all the possible adverse reactions/side effects.   Do not drive, operating heavy machinery, perform activities at heights, swimming or participation in water activities or provide baby sitting services if your were admitted for syncope or siezures until you have seen by Primary MD or a Neurologist and advised to do so again.  Do not drive when taking Pain medications.    Do not take more than prescribed Pain, Sleep and Anxiety Medications  Special Instructions: If you have smoked or chewed Tobacco  in the last 2 yrs please stop smoking, stop any  regular Alcohol  and or any Recreational drug use.  Wear Seat belts while driving.   Please note  You were cared for by a hospitalist during your hospital stay. If you have any questions about your discharge medications or the care you received while you were in the hospital after you are discharged, you can call the unit and asked to speak with the hospitalist on call if the hospitalist that took care of you is not available. Once you are discharged, your primary care physician will handle any further medical issues. Please note that NO REFILLS for any discharge medications will be authorized once you are discharged, as it is imperative that you return to your primary care physician (or establish a relationship with a primary care physician if you do not have one) for your aftercare needs so that they can reassess your need for medications and monitor your lab values.  ------------------------------------------------------------------------------------------------------------------------------------------------------------------------------------------------------------------------- Information on my medicine - ELIQUIS (apixaban)  This medication education was reviewed with me or my healthcare representative as part of my discharge preparation.  The pharmacist that spoke with me during my hospital stay was:  Linda Hedges, [facility]  Why was Eliquis prescribed for you? Eliquis was prescribed for you to reduce the risk of a blood clot forming that can cause a stroke if you have a medical condition called atrial fibrillation (a type of irregular heartbeat).  What do You need to know about Eliquis ? Take your Eliquis TWICE DAILY - one tablet in the morning and one tablet in the evening  with or without food. If you have difficulty swallowing the tablet whole please discuss with your pharmacist how to take the medication safely. DO NOT TAKE WITH XARELTO.  Take Eliquis exactly as prescribed by your  doctor and DO NOT stop taking Eliquis without talking to the doctor who prescribed the medication.  Stopping may increase your risk of developing a stroke.  Refill your prescription before you run out.  After discharge, you should have regular check-up appointments with your healthcare provider that is prescribing your Eliquis.  In the future your dose may need to be changed if your kidney function or weight changes by a significant amount or as you get older.  What do you do if you miss a dose? If you miss a dose, take it as soon as you remember on the same day and resume taking twice daily.  Do not take more than one dose of ELIQUIS at the same time to make up a missed dose.  Important Safety Information A possible side effect of Eliquis is bleeding. You should call your healthcare provider right away if you experience any of the following: ? Bleeding from an injury or your nose that does not stop. ? Unusual colored urine (red or dark brown) or unusual colored stools (red or black). ? Unusual bruising for unknown reasons. ? A serious fall or if you hit your head (even if there is no bleeding).  Some medicines may interact with Eliquis and might increase your risk of bleeding or clotting while on Eliquis. To help avoid this, consult your healthcare provider or pharmacist prior to using any new prescription or non-prescription medications, including herbals, vitamins, non-steroidal anti-inflammatory drugs (NSAIDs) and supplements.  This website has more information on Eliquis (apixaban): http://www.eliquis.com/eliquis/home

## 2015-07-31 NOTE — Care Management Important Message (Signed)
Important Message  Patient Details  Name: Calvin [name] MRN: [id number] Date of Birth: 07-Nov-1932   Medicare Important Message Given:  Yes    Nathen May 07/31/2015, 12:51 PM

## 2015-07-31 NOTE — Progress Notes (Signed)
Reviewed discharge paperwork with pt and wife.  Prescriptions reviewed and given.  Wife requested oxygen to take pt home with since she did not bring portable oxygen from home.  Spoke to [place] with case management.  Will continue to monitor until ready for discharge.

## 2015-07-31 NOTE — Discharge Summary (Signed)
Calvin [name], is a 82 y.o. male  DOB 18-Jun-1932  MRN [id number].  Admission date:  07/25/2015  Admitting Physician  [name], DO  Discharge Date:  07/31/2015   Primary MD   [name], MD  Recommendations for primary care physician for things to follow:   Check CBC, BMP in 2-3 days.   Admission Diagnosis  Upper GI bleed [K92.2] Severe anemia [D64.9]   Discharge Diagnosis  Upper GI bleed [K92.2] Severe anemia [D64.9]     Principal Problem:   UGI bleed Active Problems:   HTN (hypertension)   Diabetes mellitus (HCC)   COPD (chronic obstructive pulmonary disease) ([place])   Atrial fibrillation (HCC)   Chronic anticoagulation-Xarelto prescribed 10/27/14      Past Medical History  Diagnosis Date  . Essential hypertension   . Hypertriglyceridemia   . CAD (coronary artery disease)     a. 06/2005 s/p CABG x1 (VG->RCA) @ time of myxoma resection.  . 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  . Carotid artery disease ([place])     a. 06/2005 s/p R CEA;  b. 10/2013 Carotid U/S: patent RICA, LICA 59-93%;  c. 08/7015 U/S: RICA 79-39%, LICA 03-00%.  . Hearing loss   . COPD (chronic obstructive pulmonary disease) ([place])   . Atrial flutter with rapid ventricular response ([place])     a. 2007 s/p DCCV following CV surgery;  b. 10/2014->CHA2DS2VASc = 5-->Xarelto.  . Squamous cell lung cancer ([place])     a. s/p L pneumonectomy in 2000.  . Type II diabetes mellitus ([place])   . Scarlet fever     a. ~ 1942.  Marland Kitchen History of pneumonia   . GERD (gastroesophageal reflux disease)   . Daily headache   . Arthritis   . Depression   . On home oxygen therapy     "2L; 24/7" (11/03/2014)  . Stroke ([place])   . GI bleed 07/2015    Past Surgical History  Procedure Laterality Date  . Coronary artery bypass graft  07/12/2005    Dr  Servando Snare, CABG x 1 with reverse saphenous vein graft to distal rt coronary artery and excision of left atrial myxoma [Other]  . Pneumonectomy Left 05/03/1998    Dr Servando Snare  . Combined mediastinoscopy and bronchoscopy  05/01/1998    Dr Servando Snare  . Carotid endarterectomy Right 2007  . Cardiac catheterization  06/2005; 01/2010    Archie Endo 10/06/2010; Archie Endo 02/02/2010  . Transesophageal echocardiogram  06/2005    Archie Endo 09/04/2010  . Cardioversion  07/2005    DCC/notes 09/04/2010  . Renal angiogram Bilateral 12/2005    Archie Endo 09/04/2010  . Esophagogastroduodenoscopy (egd) with propofol N/A 07/26/2015    Procedure: ESOPHAGOGASTRODUODENOSCOPY (EGD) WITH PROPOFOL;  Surgeon: Wonda Horner, MD;  Location[facility] ENDOSCOPY;  Service: Endoscopy;  Laterality: N/A;       HPI  from the history and physical done on the day of admission:   Calvin [name] is a 82 y.o. male with h/o A.Fib, he  is on chronic Xarelto, HTN, CAD, lung ca in past treated with pneumonectomy. COPD on continuous 2L [place] at baseline. Patient presents to the ED with low BP that has been varying over the past week. He also has been having black tarry stools for the past week. Patient less active and lying in bed due to decreased energy. BP dropping when getting up to standing position. BP this morning was in the 80s per wife. EMS called, patient was orthostatic. Brought to ED.  Work up in ED: HGB 5.8 down from 12.5 in July of last year, FOBT positive.     Hospital Course:     1.Lower GI bleed with evidence of acute blood loss related anemia - received 2 units of packed RBC transfusion on 07/25/2015 and 1 unit of packed RBC on 07/28/2015, [facility] was consulted underwent unremarkable EGD on 07/26/2015, underwent colonoscopy on 07/27/2015 confirming bleeding cecal angiodysplasia, status post argon plasma coagulation on 07/27/2015 by Dr. [name].   He subsequently tolerated diet, 48 hours post colonoscopy he was placed on heparin drip which he  tolerated well, no further evidence of bleed, H&H remained stable, after 24 hours of heparin drip he has been placed on low-dose Eliquis for A. fib. H&H continues to be stable. He will be discharged with outpatient follow-up with PCP and GI.  2. Chronic atrial flutter. [place] vasc 2 score of 6. Was initially on xaralto and this was stopped, due to his bleed anticoagulation was held briefly, 48 hours post colonoscopy he was placed on heparin drip which he tolerated well, no further evidence of bleed, H&H remained stable, after 24 hours of heparin drip he has been placed on low-dose Eliquis for A. fib. H&H continues to be stable.  3. Underlying COPD. Stable. Supportive care.  4. Essential hypertension. Continue combination of diltiazem, ACE inhibitor and Coreg.    5. DM type II. Continue home regimen.   Follow UP  Follow-up Information    Follow up with  [name], MD. Schedule an appointment as soon as possible for a visit in 2 days.   Specialty:  Family Medicine   Contact information:   [place] [address] [phone]       Follow up with Cassell Clement, MD. Schedule an appointment as soon as possible for a visit in 1 week.   Specialty:  Gastroenterology   Contact information:   [address] [place] [address] [phone]        Consults obtained - GI, cardiology  Discharge Condition: Stable  Diet and Activity recommendation: See Discharge Instructions below  Discharge Instructions      Discharge Instructions    Discharge instructions    Complete by:  As directed   Follow with Primary MD  [name], MD in 2 days   Get CBC, CMP, 2 view Chest X ray checked  by Primary MD next visit.    Activity: As tolerated with Full fall precautions use walker/cane & assistance as needed   Disposition Home    Diet:   Heart Healthy Low Carb.  For Heart failure patients - Check your Weight same time everyday, if you gain over 2 pounds, or you  develop in leg swelling, experience more shortness of breath or chest pain, call your Primary MD immediately. Follow Cardiac Low Salt Diet and 1.5 lit/day fluid restriction.   On your next visit with your primary care physician please Get Medicines reviewed and adjusted.   Please  request your Prim.MD to go over all Hospital Tests and Procedure/Radiological results at the follow up, please get all Hospital records sent to your Prim MD by signing hospital release before you go home.   If you experience worsening of your admission symptoms, develop shortness of breath, life threatening emergency, suicidal or homicidal thoughts you must seek medical attention immediately by calling 911 or calling your MD immediately  if symptoms less severe.  You Must read complete instructions/literature along with all the possible adverse reactions/side effects for all the Medicines you take and that have been prescribed to you. Take any new Medicines after you have completely understood and accpet all the possible adverse reactions/side effects.   Do not drive, operating heavy machinery, perform activities at heights, swimming or participation in water activities or provide baby sitting services if your were admitted for syncope or siezures until you have seen by Primary MD or a Neurologist and advised to do so again.  Do not drive when taking Pain medications.    Do not take more than prescribed Pain, Sleep and Anxiety Medications  Special Instructions: If you have smoked or chewed Tobacco  in the last 2 yrs please stop smoking, stop any regular Alcohol  and or any Recreational drug use.  Wear Seat belts while driving.   Please note  You were cared for by a hospitalist during your hospital stay. If you have any questions about your discharge medications or the care you received while you were in the hospital after you are discharged, you can call the unit and asked to speak with the hospitalist on call if  the hospitalist that took care of you is not available. Once you are discharged, your primary care physician will handle any further medical issues. Please note that NO REFILLS for any discharge medications will be authorized once you are discharged, as it is imperative that you return to your primary care physician (or establish a relationship with a primary care physician if you do not have one) for your aftercare needs so that they can reassess your need for medications and monitor your lab values.     Increase activity slowly    Complete by:  As directed              Discharge Medications       Medication List    STOP taking these medications        ranitidine 150 MG capsule  Commonly known as:  ZANTAC     rivaroxaban 20 MG Tabs tablet  Commonly known as:  XARELTO      TAKE these medications        acetaminophen 325 MG tablet  Commonly known as:  TYLENOL  Take 325 mg by mouth every 6 (six) hours as needed for mild pain, moderate pain or headache. For pain     albuterol 108 (90 Base) MCG/ACT inhaler  Commonly known as:  PROVENTIL HFA;VENTOLIN HFA  Inhale 2 puffs into the lungs every 6 (six) hours as needed for wheezing.     apixaban 5 MG Tabs tablet  Commonly known as:  ELIQUIS  Take 1 tablet (5 mg total) by mouth 2 (two) times daily.     carvedilol 12.5 MG tablet  Commonly known as:  COREG  Take 1 tablet (12.5 mg total) by mouth 2 (two) times daily with a meal.     cetirizine 10 MG tablet  Commonly known as:  ZYRTEC  Take 10 mg by mouth daily as  needed for allergies or rhinitis. For allergies     cholecalciferol 1000 units tablet  Commonly known as:  VITAMIN D  Take 2,000 Units by mouth 2 (two) times daily.     diltiazem 180 MG 24 hr capsule  Commonly known as:  CARDIZEM CD  Take 1 capsule (180 mg total) by mouth daily.     docusate sodium 100 MG capsule  Commonly known as:  COLACE  Take 2 capsules (200 mg total) by mouth daily.     EPIPEN 2-PAK 0.3  mg/0.3 mL Soaj injection  Generic drug:  EPINEPHrine  Inject 0.3 mg into the skin See admin instructions. Take as needed for severe allergic reaction     Fish Oil 1200 MG Caps  Take 1 capsule by mouth 2 (two) times daily.     ipratropium-albuterol 0.5-2.5 (3) MG/3ML Soln  Commonly known as:  DUONEB  Take 3 mLs by nebulization 3 (three) times daily.     losartan-hydrochlorothiazide 100-25 MG tablet  Commonly known as:  HYZAAR  Take 1 tablet by mouth daily.     magnesium oxide 400 MG tablet  Commonly known as:  MAG-OX  Take 400 mg by mouth at bedtime.     metFORMIN 500 MG tablet  Commonly known as:  GLUCOPHAGE  Take 500 mg by mouth daily with breakfast.     multivitamins ther. w/minerals Tabs tablet  Take 1 tablet by mouth daily.     pantoprazole 40 MG tablet  Commonly known as:  PROTONIX  Take 1 tablet (40 mg total) by mouth 2 (two) times daily.     polyethylene glycol packet  Commonly known as:  MIRALAX / GLYCOLAX  Take 17 g by mouth 2 (two) times daily.     pravastatin 20 MG tablet  Commonly known as:  PRAVACHOL  Take 1 tablet (20 mg total) by mouth every other day.     triamcinolone cream 0.1 %  Commonly known as:  KENALOG        Major procedures and Radiology Reports - PLEASE review detailed and final reports for all details, in brief -   EGD. Unremarkable  Colonoscopy confirming bleeding cecal angiodysplasia, status post argon plasma coagulation on 07/27/2015 by Dr. [name]   Dg Chest 2 View  07/25/2015  CLINICAL DATA:  Shortness of breath with hypotension and dizziness EXAM: CHEST  2 VIEW COMPARISON:  May 25, 2015 FINDINGS: There is evidence of prior pneumonectomy with calcification on the left, probably due to chronic empyema. The right lung is hyperexpanded but clear. The heart size is normal. Pulmonary vascularity on the right is normal. Patient is status post coronary artery bypass grafting. No adenopathy evident. No bone lesions. IMPRESSION: Stable  postoperative change on the left with calcification likely indicative of chronic empyema. Right lung clear. No change in cardiac silhouette. Electronically Signed   By: Lowella Grip III M.D.   On: 07/25/2015 16:30    Micro Results      No results found for this or any previous visit (from the past 240 hour(s)).  Today   Subjective    Calvin [name] today has no headache,no chest abdominal pain,no new weakness tingling or numbness, feels much better wants to go home today.    Objective   Blood pressure 123/54, pulse 63, temperature 97.8 F (36.6 C), temperature source Oral, resp. rate 18, height '5\' 11"'$  (1.803 m), weight 92.7 kg (204 lb 5.9 oz), SpO2 98 %.   Intake/Output Summary (Last 24 hours) at 07/31/15 [phone]  Last data filed at 07/31/15 0645  Gross per 24 hour  Intake 1150.1 ml  Output    500 ml  Net  650.1 ml    Exam Awake Alert, Oriented x 3, No new F.N deficits, Normal affect [place].AT,PERRAL Supple Neck,No JVD, No cervical lymphadenopathy appriciated.  Symmetrical Chest wall movement, Good air movement bilaterally, CTAB RRR,No Gallops,Rubs or new Murmurs, No Parasternal Heave +ve B.Sounds, Abd Soft, Non tender, No organomegaly appriciated, No rebound -guarding or rigidity. No Cyanosis, Clubbing or edema, No new Rash or bruise   Data Review   CBC w Diff: Lab Results  Component Value Date   WBC 8.3 07/31/2015   HGB 8.1* 07/31/2015   HCT 27.5* 07/31/2015   PLT 151 07/31/2015   LYMPHOPCT 16 07/25/2015   MONOPCT 10 07/25/2015   EOSPCT 2 07/25/2015   BASOPCT 0 07/25/2015    CMP: Lab Results  Component Value Date   NA 139 07/28/2015   K 4.3 07/28/2015   CL 98* 07/28/2015   CO2 36* 07/28/2015   BUN 9 07/28/2015   CREATININE 1.07 07/28/2015   PROT 5.3* 07/25/2015   ALBUMIN 3.2* 07/25/2015   BILITOT 0.4 07/25/2015   ALKPHOS 42 07/25/2015   AST 19 07/25/2015   ALT 16* 07/25/2015  .   Total Time in preparing paper work, data evaluation and todays exam - 35  minutes  Thurnell Lose M.D on 07/31/2015 at 8:26 AM  [facility]   Office  [phone]

## 2015-08-09 ENCOUNTER — Ambulatory Visit: Payer: Medicare Other | Admitting: Cardiology

## 2015-08-11 ENCOUNTER — Telehealth: Payer: Self-pay | Admitting: Interventional Cardiology

## 2015-08-11 NOTE — Telephone Encounter (Signed)
New message   Pt wife is calling to see if the Follow up appt on 08/16/15 is needed  Per notes it is a follow up after his RENAL that was canceled on 07/24/15  Please call pt wife

## 2015-08-11 NOTE — Telephone Encounter (Signed)
LM TO CALL BACK ./CY 

## 2015-08-14 NOTE — Telephone Encounter (Signed)
F/u  Pt wife returning RN phon ecall. Please call back and discuss.   

## 2015-08-14 NOTE — Telephone Encounter (Signed)
CANCELLED UPCOMING APPT  AND   SCHEDULED THE YEARLY AS PREVIOUSLY  DISCUSSED .[name]

## 2015-08-16 ENCOUNTER — Ambulatory Visit: Payer: Medicare Other | Admitting: Physician Assistant

## 2015-08-28 ENCOUNTER — Ambulatory Visit (INDEPENDENT_AMBULATORY_CARE_PROVIDER_SITE_OTHER): Payer: Medicare Other | Admitting: Adult Health

## 2015-08-28 ENCOUNTER — Encounter: Payer: Self-pay | Admitting: Adult Health

## 2015-08-28 VITALS — BP 124/70 | HR 95 | Temp 97.9°F | Ht 70.0 in | Wt 207.0 lb

## 2015-08-28 DIAGNOSIS — J449 Chronic obstructive pulmonary disease, unspecified: Secondary | ICD-10-CM | POA: Diagnosis not present

## 2015-08-28 DIAGNOSIS — I6523 Occlusion and stenosis of bilateral carotid arteries: Secondary | ICD-10-CM | POA: Diagnosis not present

## 2015-08-28 DIAGNOSIS — J9611 Chronic respiratory failure with hypoxia: Secondary | ICD-10-CM | POA: Diagnosis not present

## 2015-08-28 NOTE — Progress Notes (Signed)
Subjective:    Patient ID: Calvin [name], male    DOB: 08-18-1932, 82 y.o.   MRN: [id number]  HPI 82 yo yo male with very severe COPD, history of pneumonectomy on the left with chronic respiratory failure.   TEST :  FEV1 0.8 L/26% with a ratio 54 very severe obstructive lung disease Gold stage IV COPD.  08/28/2015 Follow up : COPD /O2 dependent  Patient returns for a six-month follow-up. Overall he says he is doing okay , gets sob with activity . Has some nasal congestion and drainage.  Denies cough, congestion , hemoptysis, chest pain or edema.  At rest he feels okay.  Pneumovax and Prevnar are up-to-date.  Chest x-ray last month showed stable chronic changes. He remains on DuoNeb 3 times daily. Patient was recently admitted last month for lower GI bleed requiring blood transfusion.  He is feeling much better. Hbg was rechecked at PCP and says Hbg is up to 9 .  He was changed from Xarelto too low dose Eliquis .     Past Medical History  Diagnosis Date  . Essential hypertension   . Hypertriglyceridemia   . CAD (coronary artery disease)     a. 06/2005 s/p CABG x1 (VG->RCA) @ time of myxoma resection.  . 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  . Carotid artery disease ([place])     a. 06/2005 s/p R CEA;  b. 10/2013 Carotid U/S: patent RICA, LICA 91-47%;  c. 11/2954 U/S: RICA 21-30%, LICA 86-57%.  . Hearing loss   . COPD (chronic obstructive pulmonary disease) ([place])   . Atrial flutter with rapid ventricular response ([place])     a. 2007 s/p DCCV following CV surgery;  b. 10/2014->CHA2DS2VASc = 5-->Xarelto.  . Squamous cell lung cancer ([place])     a. s/p L pneumonectomy in 2000.  . Type II diabetes mellitus ([place])   . Scarlet fever     a. ~ 1942.  Marland Kitchen History of pneumonia   . GERD (gastroesophageal reflux disease)   . Daily headache   . Arthritis   . Depression   . On home oxygen therapy     "2L; 24/7" (11/03/2014)  . Stroke ([place])   . GI bleed 07/2015   Current Outpatient  Prescriptions on File Prior to Visit  Medication Sig Dispense Refill  . acetaminophen (TYLENOL) 325 MG tablet Take 325 mg by mouth every 6 (six) hours as needed for mild pain, moderate pain or headache. For pain    . albuterol (PROVENTIL HFA;VENTOLIN HFA) 108 (90 BASE) MCG/ACT inhaler Inhale 2 puffs into the lungs every 6 (six) hours as needed for wheezing.    Marland Kitchen apixaban (ELIQUIS) 5 MG TABS tablet Take 1 tablet (5 mg total) by mouth 2 (two) times daily. 60 tablet 0  . carvedilol (COREG) 12.5 MG tablet Take 1 tablet (12.5 mg total) by mouth 2 (two) times daily with a meal. (Patient taking differently: Take 12.5 mg by mouth 2 (two) times daily with a meal. TAEK 2ND DOSE AT 8PM) 180 tablet 3  . cetirizine (ZYRTEC) 10 MG tablet Take 10 mg by mouth daily as needed for allergies or rhinitis. For allergies    . cholecalciferol (VITAMIN D) 1000 UNITS tablet Take 2,000 Units by mouth 2 (two) times daily.     Marland Kitchen diltiazem (CARDIZEM CD) 180 MG 24 hr capsule Take 1 capsule (180 mg total) by mouth daily. 90 capsule 3  . docusate sodium (COLACE) 100 MG  capsule Take 2 capsules (200 mg total) by mouth daily. 30 capsule 0  . EPIPEN 2-PAK 0.3 MG/0.3ML DEVI Inject 0.3 mg into the skin See admin instructions. Take as needed for severe allergic reaction    . ipratropium-albuterol (DUONEB) 0.5-2.5 (3) MG/3ML SOLN Take 3 mLs by nebulization 3 (three) times daily. 270 mL 11  . losartan-hydrochlorothiazide (HYZAAR) 100-25 MG per tablet Take 1 tablet by mouth daily. 90 tablet 3  . magnesium oxide (MAG-OX) 400 MG tablet Take 400 mg by mouth at bedtime.     . metFORMIN (GLUCOPHAGE) 500 MG tablet Take 500 mg by mouth daily with breakfast.     . Multiple Vitamins-Minerals (MULTIVITAMINS THER. W/MINERALS) TABS Take 1 tablet by mouth daily.    . Omega-3 Fatty Acids (FISH OIL) 1200 MG CAPS Take 1 capsule by mouth 2 (two) times daily.    . pantoprazole (PROTONIX) 40 MG tablet Take 1 tablet (40 mg total) by mouth 2 (two) times daily.  60 tablet 1  . polyethylene glycol (MIRALAX / GLYCOLAX) packet Take 17 g by mouth 2 (two) times daily. 14 each 0  . triamcinolone cream (KENALOG) 0.1 %      No current facility-administered medications on file prior to visit.     Review of Systems Constitutional:   No  weight loss, night sweats,  Fevers, chills, +fatigue, or  lassitude.  HEENT:   No headaches,  Difficulty swallowing,  Tooth/dental problems, or  Sore throat,                No sneezing, itching, ear ache, nasal congestion, post nasal drip,   CV:  No chest pain,  Orthopnea, PND, swelling in lower extremities, anasarca, dizziness, palpitations, syncope.   GI  No heartburn, indigestion, abdominal pain, nausea, vomiting, diarrhea, change in bowel habits, loss of appetite, bloody stools.   Resp:   No excess mucus, no productive cough,  No non-productive cough,  No coughing up of blood.  No change in color of mucus.  No wheezing.  No chest wall deformity  Skin: no rash or lesions.  GU: no dysuria, change in color of urine, no urgency or frequency.  No flank pain, no hematuria   MS:  No joint pain or swelling.  No decreased range of motion.  No back pain.  Psych:  No change in mood or affect. No depression or anxiety.  No memory loss.         Objective:   Physical Exam VS reviewed   GEN: A/Ox3; pleasant , NAD, elderly on O2   HEENT:  [place]/AT,  EACs-clear, TMs-wnl, NOSE-clear, THROAT-clear, no lesions, no postnasal drip or exudate noted.   NECK:  Supple w/ fair ROM; no JVD; normal carotid impulses w/o bruits; no thyromegaly or nodules palpated; no lymphadenopathy.  RESP  Decreased BS in left -chronic no accessory muscle use, no dullness to percussion  CARD:  RRR, no m/r/g  , no peripheral edema, pulses intact, no cyanosis or clubbing.  GI:   Soft & nt; nml bowel sounds; no organomegaly or masses detected.  Musco: Warm bil, no deformities or joint swelling noted.   Neuro: alert, no focal deficits noted.     Skin: Warm, no lesions or rashes  Tammy Parrett NP-C  [facility]  08/28/2015       Assessment & Plan:

## 2015-08-28 NOTE — Assessment & Plan Note (Signed)
Compensated on present regimen  Vaccines utd  Plan  Continue on DuoNeb Three times a day   Continue on oxygen at 2l/m .  Use saline nasal rinses and gel As needed   Follow-up with Dr. Chase Caller in 6 months and as needed.

## 2015-08-28 NOTE — Patient Instructions (Addendum)
Continue on DuoNeb Three times a day   Continue on oxygen at 2l/m .  Use saline nasal rinses and gel As needed   Follow-up with Dr. Chase Caller in 6 months and as needed.

## 2015-08-28 NOTE — Assessment & Plan Note (Signed)
Compensated on O2  

## 2015-09-15 ENCOUNTER — Telehealth: Payer: Self-pay | Admitting: Student

## 2015-09-15 NOTE — Telephone Encounter (Signed)
  Patient's wife called stating his recent blood pressure was 205/97. Apparently it has been well-controlled until today. His BP was elevated this AM but came down to 150/62 after taking his medications. She has checked his BP 6+ times today.  I advised her to give his evening dose of Coreg 12.'5mg'$  at this time as his HR is 85 and BP remains elevated. He reports feeling warm but denies any headaches, lightheadedness, or pre-syncope.  She was asked to recheck his BP in two hours and if still elevated (SBP > 150), to give an additional Coreg if HR is greater than 55. If elevated, she will then recheck prior to bedtime and call our office line if it persists.  If BP remains elevated, he may benefit from higher doses of his medications (HTN was an issue in previous hospitalizations) or sustained release BB in addition to his Cardizem CD.  Signed, Erma Heritage, PA-C 09/15/2015, 5:43 PM Pager: [phone]

## 2015-09-22 ENCOUNTER — Emergency Department (HOSPITAL_BASED_OUTPATIENT_CLINIC_OR_DEPARTMENT_OTHER): Payer: Medicare Other

## 2015-09-22 ENCOUNTER — Emergency Department (HOSPITAL_BASED_OUTPATIENT_CLINIC_OR_DEPARTMENT_OTHER)
Admission: EM | Admit: 2015-09-22 | Discharge: 2015-09-22 | Disposition: A | Discharge status: Home / Self Care | Payer: Medicare Other | Attending: Emergency Medicine | Admitting: Emergency Medicine

## 2015-09-22 ENCOUNTER — Encounter (HOSPITAL_BASED_OUTPATIENT_CLINIC_OR_DEPARTMENT_OTHER): Payer: Self-pay | Admitting: *Deleted

## 2015-09-22 DIAGNOSIS — Z85118 Personal history of other malignant neoplasm of bronchus and lung: Secondary | ICD-10-CM | POA: Insufficient documentation

## 2015-09-22 DIAGNOSIS — Z8673 Personal history of transient ischemic attack (TIA), and cerebral infarction without residual deficits: Secondary | ICD-10-CM | POA: Insufficient documentation

## 2015-09-22 DIAGNOSIS — E119 Type 2 diabetes mellitus without complications: Secondary | ICD-10-CM | POA: Insufficient documentation

## 2015-09-22 DIAGNOSIS — Z7984 Long term (current) use of oral hypoglycemic drugs: Secondary | ICD-10-CM | POA: Diagnosis not present

## 2015-09-22 DIAGNOSIS — J449 Chronic obstructive pulmonary disease, unspecified: Secondary | ICD-10-CM | POA: Insufficient documentation

## 2015-09-22 DIAGNOSIS — F329 Major depressive disorder, single episode, unspecified: Secondary | ICD-10-CM | POA: Insufficient documentation

## 2015-09-22 DIAGNOSIS — I2510 Atherosclerotic heart disease of native coronary artery without angina pectoris: Secondary | ICD-10-CM | POA: Insufficient documentation

## 2015-09-22 DIAGNOSIS — M1990 Unspecified osteoarthritis, unspecified site: Secondary | ICD-10-CM | POA: Insufficient documentation

## 2015-09-22 DIAGNOSIS — Z79899 Other long term (current) drug therapy: Secondary | ICD-10-CM | POA: Insufficient documentation

## 2015-09-22 DIAGNOSIS — I159 Secondary hypertension, unspecified: Secondary | ICD-10-CM | POA: Insufficient documentation

## 2015-09-22 DIAGNOSIS — Z87891 Personal history of nicotine dependence: Secondary | ICD-10-CM | POA: Insufficient documentation

## 2015-09-22 LAB — CBC
HCT: 32.8 % — ABNORMAL LOW (ref 39.0–52.0)
Hemoglobin: 9.9 g/dL — ABNORMAL LOW (ref 13.0–17.0)
MCH: 27.3 pg (ref 26.0–34.0)
MCHC: 30.2 g/dL (ref 30.0–36.0)
MCV: 90.6 fL (ref 78.0–100.0)
PLATELETS: 200 10*3/uL (ref 150–400)
RBC: 3.62 MIL/uL — ABNORMAL LOW (ref 4.22–5.81)
RDW: 13.9 % (ref 11.5–15.5)
WBC: 8.9 10*3/uL (ref 4.0–10.5)

## 2015-09-22 LAB — BASIC METABOLIC PANEL
Anion gap: 8 (ref 5–15)
BUN: 22 mg/dL — AB (ref 6–20)
CO2: 34 mmol/L — ABNORMAL HIGH (ref 22–32)
CREATININE: 1.32 mg/dL — AB (ref 0.61–1.24)
Calcium: 9.7 mg/dL (ref 8.9–10.3)
Chloride: 94 mmol/L — ABNORMAL LOW (ref 101–111)
GFR calc Af Amer: 57 mL/min — ABNORMAL LOW (ref >60)
GFR, EST NON AFRICAN AMERICAN: 49 mL/min — AB (ref >60)
GLUCOSE: 164 mg/dL — AB (ref 65–99)
Potassium: 3.6 mmol/L (ref 3.5–5.1)
SODIUM: 136 mmol/L (ref 135–145)

## 2015-09-22 LAB — TROPONIN I: Troponin I: <0.03 ng/mL (ref <0.031)

## 2015-09-22 MED ORDER — CARVEDILOL 12.5 MG PO TABS: 12.5000 mg | ORAL_TABLET | Freq: Three times a day (TID) | ORAL | Status: DC

## 2015-09-22 MED ORDER — CARVEDILOL 12.5 MG PO TABS: 12.5000 mg | ORAL_TABLET | Freq: Two times a day (BID) | ORAL | Status: DC

## 2015-09-22 MED ORDER — CARVEDILOL 12.5 MG PO TABS: 12.5000 mg | ORAL_TABLET | Freq: Every day | ORAL | Status: DC | PRN

## 2015-09-22 MED ORDER — CARVEDILOL 12.5 MG PO TABS
12.5000 mg | ORAL_TABLET | Freq: Two times a day (BID) | ORAL | Status: DC
  Filled 2015-09-22: qty 1

## 2015-09-22 NOTE — ED Provider Notes (Signed)
CSN: [id number]     Arrival date & time 09/22/15  1656 History   First MD Initiated Contact with Patient 09/22/15 1705     Chief Complaint  Patient presents with  . Tachycardia     (Consider location/radiation/quality/duration/timing/severity/associated sxs/prior Treatment) Patient is a 82 y.o. male presenting with hypertension.  Hypertension This is a chronic problem. The current episode started more than 1 week ago. The problem occurs constantly. Progression since onset: intermittent. Pertinent negatives include no chest pain, no headaches and no shortness of breath. Nothing aggravates the symptoms. Nothing relieves the symptoms. He has tried nothing for the symptoms. The treatment provided no relief.    Past Medical History  Diagnosis Date  . Essential hypertension   . Hypertriglyceridemia   . CAD (coronary artery disease)     a. 06/2005 s/p CABG x1 (VG->RCA) @ time of myxoma resection.  . 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  . Carotid artery disease ([place])     a. 06/2005 s/p R CEA;  b. 10/2013 Carotid U/S: patent RICA, LICA 40-34%;  c. 10/4257 U/S: RICA 56-38%, LICA 75-64%.  . Hearing loss   . COPD (chronic obstructive pulmonary disease) ([place])   . Atrial flutter with rapid ventricular response ([place])     a. 2007 s/p DCCV following CV surgery;  b. 10/2014->CHA2DS2VASc = 5-->Xarelto.  . Squamous cell lung cancer ([place])     a. s/p L pneumonectomy in 2000.  . Type II diabetes mellitus ([place])   . Scarlet fever     a. ~ 1942.  Marland Kitchen History of pneumonia   . GERD (gastroesophageal reflux disease)   . Daily headache   . Arthritis   . Depression   . On home oxygen therapy     "2L; 24/7" (11/03/2014)  . Stroke ([place])   . GI bleed 07/2015   Past Surgical History  Procedure Laterality Date  . Coronary artery bypass graft  07/12/2005    Dr Servando Snare, CABG x 1 with reverse saphenous vein graft to distal rt coronary artery and excision of left atrial myxoma [Other]  . Pneumonectomy  Left 05/03/1998    Dr Servando Snare  . Combined mediastinoscopy and bronchoscopy  05/01/1998    Dr Servando Snare  . Carotid endarterectomy Right 2007  . Cardiac catheterization  06/2005; 01/2010    Archie Endo 10/06/2010; Archie Endo 02/02/2010  . Transesophageal echocardiogram  06/2005    Archie Endo 09/04/2010  . Cardioversion  07/2005    DCC/notes 09/04/2010  . Renal angiogram Bilateral 12/2005    Archie Endo 09/04/2010  . Esophagogastroduodenoscopy (egd) with propofol N/A 07/26/2015    Procedure: ESOPHAGOGASTRODUODENOSCOPY (EGD) WITH PROPOFOL;  Surgeon: Wonda Horner, MD;  Location: [facility] ENDOSCOPY;  Service: Endoscopy;  Laterality: N/A;  . Colonoscopy N/A 07/27/2015    Procedure: COLONOSCOPY;  Surgeon: Wonda Horner, MD;  Location: [facility] ENDOSCOPY;  Service: Endoscopy;  Laterality: N/A;   Family History  Problem Relation Age of Onset  . Heart disease Mother     NOT before age 71  . Heart attack Mother   . Diabetes Mother   . Hyperlipidemia Mother   . Heart disease Sister   . Cancer Sister     Breast  . Hypertension Sister   . Stomach cancer Father   . Cancer Father     Stomach  . Hyperlipidemia Father   . Hyperlipidemia Brother   . Heart disease Brother    Social History  Substance Use Topics  . Smoking status: Former Smoker --  2.00 packs/day for 43 years    Types: Cigarettes    Quit date: 07/21/1997  . Smokeless tobacco: Never Used  . Alcohol Use: 0.0 oz/week    0 Standard drinks or equivalent per week     Comment: 11/03/2014 "never drank much; stopped in the early 1970's"    Review of Systems  Constitutional: Negative for fever and fatigue.  Eyes: Negative for pain.  Respiratory: Negative for shortness of breath.   Cardiovascular: Negative for chest pain.  Endocrine: Negative for polydipsia and polyuria.  Neurological: Negative for headaches.  All other systems reviewed and are negative.     Allergies  Bee venom; Sulfa antibiotics; Amoxicillin; and Statins  Home Medications   Prior to Admission  medications   Medication Sig Start Date End Date Taking? Authorizing Provider  acetaminophen (TYLENOL) 325 MG tablet Take 325 mg by mouth every 6 (six) hours as needed for mild pain, moderate pain or headache. For pain    Historical Provider, MD  albuterol (PROVENTIL HFA;VENTOLIN HFA) 108 (90 BASE) MCG/ACT inhaler Inhale 2 puffs into the lungs every 6 (six) hours as needed for wheezing.    Historical Provider, MD  apixaban (ELIQUIS) 5 MG TABS tablet Take 1 tablet (5 mg total) by mouth 2 (two) times daily. 07/31/15   Thurnell Lose, MD  carvedilol (COREG) 12.5 MG tablet Take 1 tablet (12.5 mg total) by mouth 2 (two) times daily with a meal. 09/22/15   Merrily Pew, MD  carvedilol (COREG) 12.5 MG tablet Take 1 tablet (12.5 mg total) by mouth daily as needed (at lunch If HR is greater than 75 and BP is greater than 601 systolic (top number)). 09/22/15   Merrily Pew, MD  cetirizine (ZYRTEC) 10 MG tablet Take 10 mg by mouth daily as needed for allergies or rhinitis. For allergies    Historical Provider, MD  cholecalciferol (VITAMIN D) 1000 UNITS tablet Take 2,000 Units by mouth 2 (two) times daily.     Historical Provider, MD  diltiazem (CARDIZEM CD) 180 MG 24 hr capsule Take 1 capsule (180 mg total) by mouth daily. 01/11/15   Jettie Booze, MD  docusate sodium (COLACE) 100 MG capsule Take 2 capsules (200 mg total) by mouth daily. 07/31/15   Thurnell Lose, MD  EPIPEN 2-PAK 0.3 MG/0.3ML DEVI Inject 0.3 mg into the skin See admin instructions. Take as needed for severe allergic reaction 11/04/11   Historical Provider, MD  ipratropium-albuterol (DUONEB) 0.5-2.5 (3) MG/3ML SOLN Take 3 mLs by nebulization 3 (three) times daily. 02/27/15   Brand Males, MD  losartan-hydrochlorothiazide (HYZAAR) 100-25 MG per tablet Take 1 tablet by mouth daily. 01/13/15   Jettie Booze, MD  magnesium oxide (MAG-OX) 400 MG tablet Take 400 mg by mouth at bedtime.     Historical Provider, MD  metFORMIN (GLUCOPHAGE) 500  MG tablet Take 500 mg by mouth daily with breakfast.  03/24/14   Historical Provider, MD  Multiple Vitamins-Minerals (MULTIVITAMINS THER. W/MINERALS) TABS Take 1 tablet by mouth daily.    Historical Provider, MD  Omega-3 Fatty Acids (FISH OIL) 1200 MG CAPS Take 1 capsule by mouth 2 (two) times daily.    Historical Provider, MD  pantoprazole (PROTONIX) 40 MG tablet Take 1 tablet (40 mg total) by mouth 2 (two) times daily. 07/31/15   Thurnell Lose, MD  polyethylene glycol (MIRALAX / GLYCOLAX) packet Take 17 g by mouth 2 (two) times daily. 07/31/15   Thurnell Lose, MD  triamcinolone cream (KENALOG) 0.1 %  04/18/15   Historical Provider, MD   BP 126/71 mmHg  Pulse 93  Temp(Src) 98.1 F (36.7 C) (Oral)  Resp 15  Ht '5\' 10"'$  (1.778 m)  Wt 210 lb (95.255 kg)  BMI 30.13 kg/m2  SpO2 98% Physical Exam  Constitutional: He is oriented to person, place, and time. He appears well-developed and well-nourished.  HENT:  Head: Normocephalic and atraumatic.  Hard of hearing, baselin  Neck: Normal range of motion.  Cardiovascular: Normal rate.   Pulmonary/Chest: Effort normal. No respiratory distress. He has no wheezes. He has no rales.  Abdominal: Soft. He exhibits no distension. There is no tenderness.  Musculoskeletal: Normal range of motion. He exhibits edema (mild aroudn the ankles, 1+ pitting).  Neurological: He is alert and oriented to person, place, and time. No cranial nerve deficit.  Skin: Skin is warm and dry. No pallor.  Nursing note and vitals reviewed.   ED Course  Procedures (including critical care time) Labs Review Labs Reviewed  BASIC METABOLIC PANEL - Abnormal; Notable for the following:    Chloride 94 (*)    CO2 34 (*)    Glucose, Bld 164 (*)    BUN 22 (*)    Creatinine, Ser 1.32 (*)    GFR calc non Af Amer 49 (*)    GFR calc Af Amer 57 (*)    All other components within normal limits  CBC - Abnormal; Notable for the following:    RBC 3.62 (*)    Hemoglobin 9.9 (*)     HCT 32.8 (*)    All other components within normal limits  TROPONIN I    Imaging Review Dg Chest 2 View  09/22/2015  CLINICAL DATA:  Tachycardia and hypertension EXAM: CHEST  2 VIEW COMPARISON:  July 25, 2015 FINDINGS: Postoperative change again noted on the left with volume loss. Calcification on the left is consistent with prior empyema in stable. The right lung is hyperexpanded but clear. The heart size is within normal limits. Pulmonary vascularity on the right is normal. Patient is status post coronary artery bypass grafting. No adenopathy apparent. No bone lesions are evident. IMPRESSION: Stable study. Postoperative change with volume loss in left. Calcification consistent with residua of prior empyema, stable. Right lung hyperexpanded but clear. Stable cardiac silhouette. Electronically Signed   By: Lowella Grip III M.D.   On: 09/22/2015 18:57   I have personally reviewed and evaluated these images and lab results as part of my medical decision-making.   EKG Interpretation   Date/Time:  Friday September 22 2015 17:10:14 EDT Ventricular Rate:  103 PR Interval:    QRS Duration: 94 QT Interval:  312 QTC Calculation: 408 R Axis:   74 Text Interpretation:  Atrial flutter Consider inferior infarct Confirmed  by Jesten Cappuccio MD, Corene Cornea [phone]) on 09/22/2015 5:31:27 PM      MDM   Final diagnoses:  Secondary hypertension, unspecified    Here secondary to asymptomatic hypertension. This resolved until without any intervention. Also with some intermittent tachycardia however has history of atrial flutter with 21 block and this is likely his baseline. He probably needs more beta blockage so patient will increase his carvedilol to 3 times a day and follow-up with his cardiologist for further management of medications.  New Prescriptions: Discharge Medication List as of 09/22/2015  7:25 PM       I have personally and contemperaneously reviewed labs and imaging and used in my decision making as  above.   A medical screening exam was  performed and I feel the patient has had an appropriate workup for their chief complaint at this time and likelihood of emergent condition existing is low and thus workup can continue on an outpatient basis.. Their vital signs are stable. They have been counseled on decision, discharge, follow up and which symptoms necessitate immediate return to the emergency department.  They verbally stated understanding and agreement with plan and discharged in stable condition.      Merrily Pew, MD 09/22/15 [phone]

## 2015-09-22 NOTE — Discharge Instructions (Signed)
Please continue taking your carvedilol as previously prescribed by your cardiologist twice a day.  Your blood pressure seemed to be a little bit uncontrolled so I will suggest taking a third dose at lunch time as long as her heart rate is over 75 and your blood pressure is not lower than 150 on the top number I would also discuss this with your cardiologist next week to ensure this is appropriate.  If you have any new or worsening symptoms feel free to be reevaluated in the emergency department.

## 2015-09-22 NOTE — ED Notes (Signed)
Wife states his heart rate was 130 at home and he was hypertensive. No pain.

## 2015-09-28 ENCOUNTER — Encounter: Payer: Medicare Other | Admitting: Cardiology

## 2015-09-29 ENCOUNTER — Ambulatory Visit (INDEPENDENT_AMBULATORY_CARE_PROVIDER_SITE_OTHER): Payer: Medicare Other | Admitting: Nurse Practitioner

## 2015-09-29 ENCOUNTER — Encounter: Payer: Self-pay | Admitting: Nurse Practitioner

## 2015-09-29 VITALS — BP 140/70 | HR 67 | Ht 70.0 in | Wt 207.1 lb

## 2015-09-29 DIAGNOSIS — R030 Elevated blood-pressure reading, without diagnosis of hypertension: Secondary | ICD-10-CM

## 2015-09-29 DIAGNOSIS — I6523 Occlusion and stenosis of bilateral carotid arteries: Secondary | ICD-10-CM

## 2015-09-29 DIAGNOSIS — R0989 Other specified symptoms and signs involving the circulatory and respiratory systems: Secondary | ICD-10-CM

## 2015-09-29 MED ORDER — CARVEDILOL 25 MG PO TABS: 25.0000 mg | ORAL_TABLET | Freq: Two times a day (BID) | ORAL | Status: DC

## 2015-09-29 NOTE — Progress Notes (Signed)
CARDIOLOGY OFFICE NOTE  Date:  09/29/2015    Calvin [name] Date of Birth: 02/22/1933 Medical Record #[id number]  PCP:   [name], MD  Cardiologist:  [facility]    Chief Complaint  Patient presents with  . Hypertension    Post ER visit - seen for Dr. Irish Lack    History of Present Illness: Calvin [name] is a 82 y.o. male who presents today for a post ER visit. Seen for Dr. Irish Lack.   He has had prior left pneumonectomy for a squamous cell carcinoma over 17 years ago. He has known CAD with remote CABG x 1 per EBG over 10 years ago and resection of atrial myxoma back in 2007. He has known underlying pulmonary disease is currently somewhat limited by shortness of breath. He uses oxygen continuously. He has had prior TIA, prior right CEA in March 2007. Apparently has chronic atrial flutter and is maintained with rate control and was previously on Xarelto as his anticoagulation - which was switched to Eliquis after GI bleed.   Last cath 2011 with single vessel disease with occluded RCA and patent SVG to RCA patent. EF 65%. Last echo 2013 with normal EF.   Last seen here by Cecilie Kicks, NP back in March - BP labile. Sounded like he was getting too much salt. She wanted to get a renal duplex. Looks like he cancelled. Looks like he has cancelled several visits back here.   Admitted back in April with GI bleed - was scoped and found a bleed - switched to Eliquis from [place] hoping that would be a safer alternative.   In the ER earlier this month with asymptomatic HTN. Also noted to have some tachycardia. His dose of Coreg was increased to TID and he was asked to follow up here. Wife is checking multiple BP and following HR and BP parameters and then given extra dose.   Comes here today. Here with his wife. BP remains labile. He is on oxygen continuously. He is very hard of hearing. Probably getting too much salt - sounds like they use a lot of processed foods. Stools look ok so far. He  remains anemic. No chest pain. Breathing is ok. Wife is frustrated with the parameters.   Past Medical History  Diagnosis Date  . Essential hypertension   . Hypertriglyceridemia   . CAD (coronary artery disease)     a. 06/2005 s/p CABG x1 (VG->RCA) @ time of myxoma resection.  . 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  . Carotid artery disease ([place])     a. 06/2005 s/p R CEA;  b. 10/2013 Carotid U/S: patent RICA, LICA 17-79%;  c. 06/9028 U/S: RICA 09-23%, LICA 30-07%.  . Hearing loss   . COPD (chronic obstructive pulmonary disease) ([place])   . Atrial flutter with rapid ventricular response ([place])     a. 2007 s/p DCCV following CV surgery;  b. 10/2014->CHA2DS2VASc = 5-->Xarelto.  . Squamous cell lung cancer ([place])     a. s/p L pneumonectomy in 2000.  . Type II diabetes mellitus ([place])   . Scarlet fever     a. ~ 1942.  Marland Kitchen History of pneumonia   . GERD (gastroesophageal reflux disease)   . Daily headache   . Arthritis   . Depression   . On home oxygen therapy     "2L; 24/7" (11/03/2014)  . Stroke ([place])   . GI bleed 07/2015    Past Surgical  History  Procedure Laterality Date  . Coronary artery bypass graft  07/12/2005    Dr Servando Snare, CABG x 1 with reverse saphenous vein graft to distal rt coronary artery and excision of left atrial myxoma [Other]  . Pneumonectomy Left 05/03/1998    Dr Servando Snare  . Combined mediastinoscopy and bronchoscopy  05/01/1998    Dr Servando Snare  . Carotid endarterectomy Right 2007  . Cardiac catheterization  06/2005; 01/2010    Archie Endo 10/06/2010; Archie Endo 02/02/2010  . Transesophageal echocardiogram  06/2005    Archie Endo 09/04/2010  . Cardioversion  07/2005    DCC/notes 09/04/2010  . Renal angiogram Bilateral 12/2005    Archie Endo 09/04/2010  . Esophagogastroduodenoscopy (egd) with propofol N/A 07/26/2015    Procedure: ESOPHAGOGASTRODUODENOSCOPY (EGD) WITH PROPOFOL;  Surgeon: Wonda Horner, MD;  Location: [facility] ENDOSCOPY;  Service: Endoscopy;  Laterality: N/A;  . Colonoscopy  N/A 07/27/2015    Procedure: COLONOSCOPY;  Surgeon: Wonda Horner, MD;  Location: [facility] ENDOSCOPY;  Service: Endoscopy;  Laterality: N/A;     Medications: Current Outpatient Prescriptions  Medication Sig Dispense Refill  . acetaminophen (TYLENOL) 325 MG tablet Take 325 mg by mouth every 6 (six) hours as needed for mild pain, moderate pain or headache. For pain    . albuterol (PROVENTIL HFA;VENTOLIN HFA) 108 (90 BASE) MCG/ACT inhaler Inhale 2 puffs into the lungs every 6 (six) hours as needed for wheezing.    Marland Kitchen apixaban (ELIQUIS) 5 MG TABS tablet Take 1 tablet (5 mg total) by mouth 2 (two) times daily. 60 tablet 0  . cetirizine (ZYRTEC) 10 MG tablet Take 10 mg by mouth daily as needed for allergies or rhinitis. For allergies    . cholecalciferol (VITAMIN D) 1000 UNITS tablet Take 2,000 Units by mouth 2 (two) times daily.     Marland Kitchen diltiazem (CARDIZEM CD) 180 MG 24 hr capsule Take 1 capsule (180 mg total) by mouth daily. 90 capsule 3  . docusate sodium (COLACE) 100 MG capsule Take 2 capsules (200 mg total) by mouth daily. 30 capsule 0  . EPIPEN 2-PAK 0.3 MG/0.3ML DEVI Inject 0.3 mg into the skin See admin instructions. Take as needed for severe allergic reaction    . ipratropium-albuterol (DUONEB) 0.5-2.5 (3) MG/3ML SOLN Take 3 mLs by nebulization 3 (three) times daily. 270 mL 11  . losartan-hydrochlorothiazide (HYZAAR) 100-25 MG per tablet Take 1 tablet by mouth daily. 90 tablet 3  . magnesium oxide (MAG-OX) 400 MG tablet Take 400 mg by mouth at bedtime.     . metFORMIN (GLUCOPHAGE) 500 MG tablet Take 500 mg by mouth daily with breakfast.     . Multiple Vitamins-Minerals (MULTIVITAMINS THER. W/MINERALS) TABS Take 1 tablet by mouth daily.    . NON FORMULARY Place 2 L into the nose continuous.    . Omega-3 Fatty Acids (FISH OIL) 1200 MG CAPS Take 1 capsule by mouth 2 (two) times daily.    . pantoprazole (PROTONIX) 40 MG tablet Take 1 tablet (40 mg total) by mouth 2 (two) times daily. 60 tablet 1  .  polyethylene glycol (MIRALAX / GLYCOLAX) packet Take 17 g by mouth 2 (two) times daily. 14 each 0  . triamcinolone cream (KENALOG) 0.1 %     . carvedilol (COREG) 25 MG tablet Take 1 tablet (25 mg total) by mouth 2 (two) times daily. 180 tablet 3   No current facility-administered medications for this visit.    Allergies: Allergies  Allergen Reactions  . Bee Venom Anaphylaxis  . Sulfa Antibiotics   .  Amoxicillin Rash  . Statins Other (See Comments)    Muscle aches    Social History: The patient  reports that he quit smoking about 18 years ago. His smoking use included Cigarettes. He has a 86 pack-year smoking history. He has never used smokeless tobacco. He reports that he drinks alcohol. He reports that he does not use illicit drugs.   Family History: The patient's family history includes Cancer in his father and sister; Diabetes in his mother; Heart attack in his mother; Heart disease in his brother, mother, and sister; Hyperlipidemia in his brother, father, and mother; Hypertension in his sister; Stomach cancer in his father.   Review of Systems: Please see the history of present illness.   Otherwise, the review of systems is positive for none.   All other systems are reviewed and negative.   Physical Exam: VS:  BP 140/70 mmHg  Pulse 67  Ht '5\' 10"'$  (1.778 m)  Wt 207 lb 1.9 oz (93.949 kg)  BMI 29.72 kg/m2 .  BMI Body mass index is 29.72 kg/(m^2).  Wt Readings from Last 3 Encounters:  09/29/15 207 lb 1.9 oz (93.949 kg)  09/22/15 210 lb (95.255 kg)  08/28/15 207 lb (93.895 kg)    General: Pleasant. He is very hard of hearing but alert and in no acute distress.  HEENT: Normal. Neck: Supple, no JVD, carotid bruits, or masses noted.  Cardiac: Regular rate and rhythm. No murmurs, rubs, or gallops. No edema.  Respiratory:  Lungs are fairly clear to auscultation bilaterally with normal work of breathing.  GI: Soft and nontender.  MS: No deformity or atrophy. Gait and ROM  intact. Skin: Warm and dry. Color is normal.  Neuro:  Strength and sensation are intact and no gross focal deficits noted.  Psych: Alert, appropriate and with normal affect.   LABORATORY DATA:  EKG:  EKG is ordered today. This demonstrates NSR today.  Lab Results  Component Value Date   WBC 8.9 09/22/2015   HGB 9.9* 09/22/2015   HCT 32.8* 09/22/2015   PLT 200 09/22/2015   GLUCOSE 164* 09/22/2015   CHOL 238* 05/18/2011   TRIG 291* 05/18/2011   HDL 36* 05/18/2011   LDLCALC 144* 05/18/2011   ALT 16* 07/25/2015   AST 19 07/25/2015   NA 136 09/22/2015   K 3.6 09/22/2015   CL 94* 09/22/2015   CREATININE 1.32* 09/22/2015   BUN 22* 09/22/2015   CO2 34* 09/22/2015   TSH 1.609 11/03/2014   INR 1.01 09/03/2013   HGBA1C 5.6 07/27/2015    BNP (last 3 results)  Recent Labs  07/25/15 1725  BNP 138.4*    ProBNP (last 3 results) No results for input(s): PROBNP in the last 8760 hours.   Other Studies Reviewed Today:   Assessment/Plan: 1. Labile HTN - will get the renal duplex. Increasing the Coreg to 25 mg BID. This should simplify his care. May need to add Hydralazine on return. Will see back in one month. Try to limit the salt but probably not realistic. 2. Coronary artery disease: Status post one-vessel bypass at the time of his cardiac surgery to remove the intracardiac tumor. No angina at this time. 3. Hyperlipidemia: on statin therapy 4. Atrial flutter. He is in sinus today  5. Carotid artery disease: Followed at [facility].   Current medicines are reviewed with the patient today.  The patient does not have concerns regarding medicines other than what has been noted above.  The following changes have been made:  See  above.  Labs/ tests ordered today include:    Orders Placed This Encounter  Procedures  . EKG 12-Lead     Disposition:   FU with me in one month. Renal duplex ordered. Coreg increased today.   Patient is agreeable to this plan and will call if any  problems develop in the interim.   Signed: Burtis Junes, RN, ANP-C 09/29/2015 3:15 PM  [facility] [address] [place], [place]  [address] Phone: [phone] Fax: [phone]

## 2015-09-29 NOTE — Patient Instructions (Addendum)
We will be checking the following labs today - NONE   Medication Instructions:    Continue with your current medicines. BUT  I am increasing the Coreg to 25 mg twice a day - you may take 2 of your 12.5 mg tablets twice a day to = this dose and use up    Testing/Procedures To Be Arranged:  Renal duplex study  Follow-Up:   See me in one month - try to do on a day that Dr. Irish Lack is here    Other Special Instructions:   Try to limit your salt as best you can.     If you need a refill on your cardiac medications before your next appointment, please call your pharmacy.   Call the [place] office at [phone] if you have any questions, problems or concerns.

## 2015-10-11 ENCOUNTER — Ambulatory Visit (HOSPITAL_COMMUNITY)
Admission: RE | Admit: 2015-10-11 | Discharge: 2015-10-11 | Disposition: A | Discharge status: Home / Self Care | Payer: Medicare Other | Source: Ambulatory Visit | Attending: Cardiovascular Disease | Admitting: Cardiovascular Disease

## 2015-10-11 DIAGNOSIS — I701 Atherosclerosis of renal artery: Secondary | ICD-10-CM | POA: Diagnosis not present

## 2015-10-11 DIAGNOSIS — I714 Abdominal aortic aneurysm, without rupture: Secondary | ICD-10-CM | POA: Diagnosis not present

## 2015-10-11 DIAGNOSIS — R030 Elevated blood-pressure reading, without diagnosis of hypertension: Secondary | ICD-10-CM | POA: Diagnosis not present

## 2015-10-11 DIAGNOSIS — R0989 Other specified symptoms and signs involving the circulatory and respiratory systems: Secondary | ICD-10-CM

## 2015-10-11 DIAGNOSIS — I10 Essential (primary) hypertension: Secondary | ICD-10-CM | POA: Diagnosis not present

## 2015-10-11 DIAGNOSIS — E781 Pure hyperglyceridemia: Secondary | ICD-10-CM | POA: Diagnosis not present

## 2015-10-11 DIAGNOSIS — R93422 Abnormal radiologic findings on diagnostic imaging of left kidney: Secondary | ICD-10-CM | POA: Diagnosis not present

## 2015-10-11 DIAGNOSIS — K219 Gastro-esophageal reflux disease without esophagitis: Secondary | ICD-10-CM | POA: Insufficient documentation

## 2015-10-11 DIAGNOSIS — F329 Major depressive disorder, single episode, unspecified: Secondary | ICD-10-CM | POA: Diagnosis not present

## 2015-10-11 DIAGNOSIS — E119 Type 2 diabetes mellitus without complications: Secondary | ICD-10-CM | POA: Insufficient documentation

## 2015-10-11 DIAGNOSIS — I2510 Atherosclerotic heart disease of native coronary artery without angina pectoris: Secondary | ICD-10-CM | POA: Diagnosis not present

## 2015-10-11 DIAGNOSIS — I700 Atherosclerosis of aorta: Secondary | ICD-10-CM | POA: Insufficient documentation

## 2015-10-11 NOTE — Progress Notes (Signed)
I am going to see him back first and see how he is doing before proceeding on with further testing.

## 2015-10-18 ENCOUNTER — Encounter (HOSPITAL_COMMUNITY): Payer: Self-pay

## 2015-10-18 ENCOUNTER — Ambulatory Visit (INDEPENDENT_AMBULATORY_CARE_PROVIDER_SITE_OTHER): Payer: Medicare Other | Admitting: Nurse Practitioner

## 2015-10-18 ENCOUNTER — Encounter: Payer: Self-pay | Admitting: Nurse Practitioner

## 2015-10-18 ENCOUNTER — Ambulatory Visit (HOSPITAL_COMMUNITY): Admit: 2015-10-18 | Payer: Self-pay | Admitting: Cardiology

## 2015-10-18 VITALS — BP 120/70 | HR 58 | Ht 70.0 in | Wt 207.4 lb

## 2015-10-18 DIAGNOSIS — I2510 Atherosclerotic heart disease of native coronary artery without angina pectoris: Secondary | ICD-10-CM

## 2015-10-18 DIAGNOSIS — E785 Hyperlipidemia, unspecified: Secondary | ICD-10-CM

## 2015-10-18 DIAGNOSIS — R030 Elevated blood-pressure reading, without diagnosis of hypertension: Secondary | ICD-10-CM

## 2015-10-18 DIAGNOSIS — R0989 Other specified symptoms and signs involving the circulatory and respiratory systems: Secondary | ICD-10-CM

## 2015-10-18 DIAGNOSIS — Z7901 Long term (current) use of anticoagulants: Secondary | ICD-10-CM

## 2015-10-18 DIAGNOSIS — I4892 Unspecified atrial flutter: Secondary | ICD-10-CM

## 2015-10-18 DIAGNOSIS — I6523 Occlusion and stenosis of bilateral carotid arteries: Secondary | ICD-10-CM

## 2015-10-18 DIAGNOSIS — I10 Essential (primary) hypertension: Secondary | ICD-10-CM

## 2015-10-18 SURGERY — LEFT HEART CATH AND CORONARY ANGIOGRAPHY

## 2015-10-18 MED ORDER — DILTIAZEM HCL ER COATED BEADS 180 MG PO CP24: 180.0000 mg | ORAL_CAPSULE | Freq: Every day | ORAL | Status: DC

## 2015-10-18 MED ORDER — CARVEDILOL 25 MG PO TABS: 25.0000 mg | ORAL_TABLET | Freq: Two times a day (BID) | ORAL | Status: DC

## 2015-10-18 NOTE — Progress Notes (Signed)
CARDIOLOGY OFFICE NOTE  Date:  10/18/2015    [name] Date of Birth: [birth year] Medical Record #[id number]  PCP:   [name], MD  Cardiologist:  [facility]   Chief Complaint  Patient presents with  . Hypertension  . Coronary Artery Disease  . Hyperlipidemia  . Irregular Heart Beat    Follow up visit - seen for Dr. Irish Lack    History of Present Illness: Calvin [name] is a 82 y.o. male who presents today for a follow up visit. Seen for Dr. Irish Lack. This is an approximate 3 week check.   He has had prior left pneumonectomy for a squamous cell carcinoma over 17 years ago. He has known CAD with remote CABG x 1 per EBG over 10 years ago and resection of atrial myxoma back in 2007. He has known underlying pulmonary disease is currently somewhat limited by shortness of breath. He uses oxygen continuously. He has had prior TIA, prior right CEA in March 2007. Apparently has chronic atrial flutter and is maintained with rate control and was previously on Xarelto as his anticoagulation - which was switched to Eliquis after GI bleed.   Last cath 2011 with single vessel disease with occluded RCA and patent SVG to RCA patent. EF 65%. Last echo 2013 with normal EF.   Seen here by Cecilie Kicks, NP back in March - BP labile. Sounded like he was getting too much salt. She wanted to get a renal duplex. Looks like he cancelled. Looks like he has cancelled several visits back here.   Admitted back in April with GI bleed - was scoped and found a bleed - switched to Eliquis from [place] hoping that would be a safer alternative.   In the ER earlier this month with asymptomatic HTN. Also noted to have some tachycardia. His dose of Coreg was increased to TID and he was asked to follow up here. Wife is checking multiple BP and following HR and BP parameters and then given extra dose. When I saw him, they were very frustrated with the parameters. They eats lots of processed foods due to their social  situation. I changed his medicines. Remains anemic.   Comes here today. Here with his wife. He remains pretty hard of hearing. His blood pressure has improved. The first reading in the morning is typically high. No chest pain. He has some pain in his neck - most likely arthritis - does not really like to take Tylenol - says this makes him more constipated. Breathing ok. He is trying to do more in the yard - says he stays hydrated.    Past Medical History  Diagnosis Date  . Essential hypertension   . Hypertriglyceridemia   . CAD (coronary artery disease)     a. 06/2005 s/p CABG x1 (VG->RCA) @ time of myxoma resection.  . 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  . Carotid artery disease ([place])     a. 06/2005 s/p R CEA;  b. 10/2013 Carotid U/S: patent RICA, LICA 56-31%;  c. 07/9700 U/S: RICA 63-78%, LICA 58-85%.  . Hearing loss   . COPD (chronic obstructive pulmonary disease) ([place])   . Atrial flutter with rapid ventricular response ([place])     a. 2007 s/p DCCV following CV surgery;  b. 10/2014->CHA2DS2VASc = 5-->Xarelto.  . Squamous cell lung cancer ([place])     a. s/p L pneumonectomy in 2000.  . Type II diabetes mellitus ([place])   .  Scarlet fever     a. ~ 1942.  Marland Kitchen History of pneumonia   . GERD (gastroesophageal reflux disease)   . Daily headache   . Arthritis   . Depression   . On home oxygen therapy     "2L; 24/7" (11/03/2014)  . Stroke ([place])   . GI bleed 07/2015    Past Surgical History  Procedure Laterality Date  . Coronary artery bypass graft  07/12/2005    Dr Servando Snare, CABG x 1 with reverse saphenous vein graft to distal rt coronary artery and excision of left atrial myxoma [Other]  . Pneumonectomy Left 05/03/1998    Dr Servando Snare  . Combined mediastinoscopy and bronchoscopy  05/01/1998    Dr Servando Snare  . Carotid endarterectomy Right 2007  . Cardiac catheterization  06/2005; 01/2010    Archie Endo 10/06/2010; Archie Endo 02/02/2010  . Transesophageal echocardiogram  06/2005    Archie Endo  09/04/2010  . Cardioversion  07/2005    DCC/notes 09/04/2010  . Renal angiogram Bilateral 12/2005    Archie Endo 09/04/2010  . Esophagogastroduodenoscopy (egd) with propofol N/A 07/26/2015    Procedure: ESOPHAGOGASTRODUODENOSCOPY (EGD) WITH PROPOFOL;  Surgeon: Wonda Horner, MD;  Location: [facility] ENDOSCOPY;  Service: Endoscopy;  Laterality: N/A;  . Colonoscopy N/A 07/27/2015    Procedure: COLONOSCOPY;  Surgeon: Wonda Horner, MD;  Location[facility] ENDOSCOPY;  Service: Endoscopy;  Laterality: N/A;     Medications: Current Outpatient Prescriptions  Medication Sig Dispense Refill  . acetaminophen (TYLENOL) 325 MG tablet Take 325 mg by mouth every 6 (six) hours as needed for mild pain, moderate pain or headache. For pain    . albuterol (PROVENTIL HFA;VENTOLIN HFA) 108 (90 BASE) MCG/ACT inhaler Inhale 2 puffs into the lungs every 6 (six) hours as needed for wheezing.    Marland Kitchen apixaban (ELIQUIS) 5 MG TABS tablet Take 1 tablet (5 mg total) by mouth 2 (two) times daily. 60 tablet 0  . carvedilol (COREG) 25 MG tablet Take 1 tablet (25 mg total) by mouth 2 (two) times daily. 180 tablet 3  . cetirizine (ZYRTEC) 10 MG tablet Take 10 mg by mouth daily as needed for allergies or rhinitis. For allergies    . cholecalciferol (VITAMIN D) 1000 UNITS tablet Take 2,000 Units by mouth 2 (two) times daily.     Marland Kitchen diltiazem (CARDIZEM CD) 180 MG 24 hr capsule Take 1 capsule (180 mg total) by mouth at bedtime. 90 capsule 3  . docusate sodium (COLACE) 100 MG capsule Take 2 capsules (200 mg total) by mouth daily. 30 capsule 0  . EPIPEN 2-PAK 0.3 MG/0.3ML DEVI Inject 0.3 mg into the skin See admin instructions. Take as needed for severe allergic reaction    . ipratropium-albuterol (DUONEB) 0.5-2.5 (3) MG/3ML SOLN Take 3 mLs by nebulization 3 (three) times daily. 270 mL 11  . losartan-hydrochlorothiazide (HYZAAR) 100-25 MG per tablet Take 1 tablet by mouth daily. 90 tablet 3  . magnesium oxide (MAG-OX) 400 MG tablet Take 400 mg by mouth at  bedtime.     . metFORMIN (GLUCOPHAGE) 500 MG tablet Take 500 mg by mouth daily with breakfast.     . Multiple Vitamins-Minerals (MULTIVITAMINS THER. W/MINERALS) TABS Take 1 tablet by mouth daily.    . NON FORMULARY Place 2 L into the nose continuous.    . Omega-3 Fatty Acids (FISH OIL) 1200 MG CAPS Take 1 capsule by mouth 2 (two) times daily.    . pantoprazole (PROTONIX) 40 MG tablet Take 1 tablet (40 mg total) by mouth 2 (  two) times daily. 60 tablet 1  . polyethylene glycol (MIRALAX / GLYCOLAX) packet Take 17 g by mouth 2 (two) times daily. 14 each 0  . triamcinolone cream (KENALOG) 0.1 %      No current facility-administered medications for this visit.    Allergies: Allergies  Allergen Reactions  . Bee Venom Anaphylaxis  . Sulfa Antibiotics   . Amoxicillin Rash  . Statins Other (See Comments)    Muscle aches    Social History: The patient  reports that he quit smoking about 18 years ago. His smoking use included Cigarettes. He has a 86 pack-year smoking history. He has never used smokeless tobacco. He reports that he drinks alcohol. He reports that he does not use illicit drugs.   Family History: The patient's family history includes Cancer in his father and sister; Diabetes in his mother; Heart attack in his mother; Heart disease in his brother, mother, and sister; Hyperlipidemia in his brother, father, and mother; Hypertension in his sister; Stomach cancer in his father.   Review of Systems: Please see the history of present illness.   Otherwise, the review of systems is positive for none.   All other systems are reviewed and negative.   Physical Exam: VS:  BP 120/70 mmHg  Pulse 58  Ht '5\' 10"'$  (1.778 m)  Wt 207 lb 6.4 oz (94.076 kg)  BMI 29.76 kg/m2 .  BMI Body mass index is 29.76 kg/(m^2).  Wt Readings from Last 3 Encounters:  10/18/15 207 lb 6.4 oz (94.076 kg)  09/29/15 207 lb 1.9 oz (93.949 kg)  09/22/15 210 lb (95.255 kg)    General: Pleasant. He is very hard of  hearing. He is alert and in no acute distress.  HEENT: Normal. Neck: Supple, no JVD, carotid bruits, or masses noted.  Cardiac: Regular rate and rhythm. Heart tones distant.  No edema.  Respiratory:  Lungs are clear to auscultation bilaterally with normal work of breathing. He has oxygen in place.   GI: Soft and nontender.  MS: No deformity or atrophy. Gait and ROM intact. Skin: Warm and dry. Color is normal.  Neuro:  Strength and sensation are intact and no gross focal deficits noted.  Psych: Alert, appropriate and with normal affect.   LABORATORY DATA:  EKG:  EKG is not ordered today.  Lab Results  Component Value Date   WBC 8.9 09/22/2015   HGB 9.9* 09/22/2015   HCT 32.8* 09/22/2015   PLT 200 09/22/2015   GLUCOSE 164* 09/22/2015   CHOL 238* 05/18/2011   TRIG 291* 05/18/2011   HDL 36* 05/18/2011   LDLCALC 144* 05/18/2011   ALT 16* 07/25/2015   AST 19 07/25/2015   NA 136 09/22/2015   K 3.6 09/22/2015   CL 94* 09/22/2015   CREATININE 1.32* 09/22/2015   BUN 22* 09/22/2015   CO2 34* 09/22/2015   TSH 1.609 11/03/2014   INR 1.01 09/03/2013   HGBA1C 5.6 07/27/2015    BNP (last 3 results)  Recent Labs  07/25/15 1725  BNP 138.4*    ProBNP (last 3 results) No results for input(s): PROBNP in the last 8760 hours.   Other Studies Reviewed Today: Notes Recorded by Burtis Junes, NP on 10/11/2015 at 3:08 PM Ok to report. He has a 1 to 59% blockage of the right renal artery. Left seems ok. Mid to distal AAA noted 3.6 x 3.8. Left kidney smaller.  Suggested he have a "dedicated renal ultrasound" if indicated.       Assessment/Plan:  1. Labile HTN - renal duplex without significant RAS noted. On Coreg to 25 mg BID to simplify his care. Try to limit the salt but probably not realistic. I am moving his Diltiazem to evening - this may help - wife will continue to monitor. Overall, his BP has improved. Would hold on adding Hydralazine for now.  2. Coronary artery disease:  Status post one-vessel bypass at the time of his cardiac surgery to remove the intracardiac tumor. No angina at this time. 3. Hyperlipidemia: on statin therapy 4. Atrial flutter. He was in sinus at last visit 5. Carotid artery disease: Followed at [facility]. 6. CKD - lab is stable. I do not think he needs dedicated ultrasound at this time  Current medicines are reviewed with the patient today.  The patient does not have concerns regarding medicines other than what has been noted above.  The following changes have been made:  See above.  Labs/ tests ordered today include:   No orders of the defined types were placed in this encounter.     Disposition:   FU with Dr. Irish Lack as planned in September - I will be happy to see back as needed.   Patient is agreeable to this plan and will call if any problems develop in the interim.   Signed: Burtis Junes, RN, ANP-C 10/18/2015 9:56 AM  [facility] [address] [place], [place]  [address] Phone: [phone] Fax: [phone]

## 2015-10-18 NOTE — Patient Instructions (Addendum)
We will be checking the following labs today - NONE   Medication Instructions:    Continue with your current medicines. BUT  Start moving the Diltiazem to night time - this will hopefully help with getting your morning BP a little lower  I refilled your Coreg today    Testing/Procedures To Be Arranged:  N/A  Follow-Up:   See Dr. Irish Lack in September as planned.     Other Special Instructions:   N/A    If you need a refill on your cardiac medications before your next appointment, please call your pharmacy.   Call the [place] office at [phone] if you have any questions, problems or concerns.

## 2015-10-23 ENCOUNTER — Telehealth: Payer: Self-pay | Admitting: Interventional Cardiology

## 2015-10-23 NOTE — Telephone Encounter (Signed)
Pt c/o BP issue: STAT if pt c/o blurred vision, one-sided weakness or slurred speech  1. What are your last 5 BP readings? 214/80 on 10/22/15 and 10/23/15 190/82 and at 11am this morning 179/71  2. Are you having any other symptoms (ex. Dizziness, headache, blurred vision, passed out)?Dizziness for 2 days   3. What is your BP issue? Elevated BP

## 2015-10-23 NOTE — Telephone Encounter (Signed)
Returned call to patient's wife.She stated she is worried about husband.Stated he has a severe headache at present.Stated he has been dizzy for the past 2 days.B/P 179/71,190/82,214/80.Stated he is in bed wanting to go to hospital.Advised to call EMS.

## 2015-10-26 ENCOUNTER — Observation Stay (HOSPITAL_COMMUNITY): Payer: Medicare Other

## 2015-10-26 ENCOUNTER — Inpatient Hospital Stay (HOSPITAL_COMMUNITY)
Admission: EM | Admit: 2015-10-26 | Discharge: 2015-10-29 | DRG: 190 | Disposition: A | Discharge status: Home / Self Care | Payer: Medicare Other | Attending: Internal Medicine | Admitting: Internal Medicine

## 2015-10-26 ENCOUNTER — Encounter (HOSPITAL_COMMUNITY): Payer: Self-pay | Admitting: Emergency Medicine

## 2015-10-26 ENCOUNTER — Emergency Department (HOSPITAL_COMMUNITY): Payer: Medicare Other

## 2015-10-26 DIAGNOSIS — I10 Essential (primary) hypertension: Secondary | ICD-10-CM | POA: Diagnosis not present

## 2015-10-26 DIAGNOSIS — Z9981 Dependence on supplemental oxygen: Secondary | ICD-10-CM

## 2015-10-26 DIAGNOSIS — I2510 Atherosclerotic heart disease of native coronary artery without angina pectoris: Secondary | ICD-10-CM | POA: Diagnosis present

## 2015-10-26 DIAGNOSIS — E872 Acidosis: Secondary | ICD-10-CM | POA: Diagnosis present

## 2015-10-26 DIAGNOSIS — J9622 Acute and chronic respiratory failure with hypercapnia: Secondary | ICD-10-CM | POA: Diagnosis present

## 2015-10-26 DIAGNOSIS — D696 Thrombocytopenia, unspecified: Secondary | ICD-10-CM | POA: Diagnosis present

## 2015-10-26 DIAGNOSIS — J9602 Acute respiratory failure with hypercapnia: Secondary | ICD-10-CM | POA: Diagnosis present

## 2015-10-26 DIAGNOSIS — H9190 Unspecified hearing loss, unspecified ear: Secondary | ICD-10-CM | POA: Diagnosis present

## 2015-10-26 DIAGNOSIS — Z9889 Other specified postprocedural states: Secondary | ICD-10-CM

## 2015-10-26 DIAGNOSIS — R269 Unspecified abnormalities of gait and mobility: Secondary | ICD-10-CM | POA: Diagnosis present

## 2015-10-26 DIAGNOSIS — J441 Chronic obstructive pulmonary disease with (acute) exacerbation: Principal | ICD-10-CM | POA: Diagnosis present

## 2015-10-26 DIAGNOSIS — R0789 Other chest pain: Secondary | ICD-10-CM | POA: Diagnosis present

## 2015-10-26 DIAGNOSIS — I480 Paroxysmal atrial fibrillation: Secondary | ICD-10-CM

## 2015-10-26 DIAGNOSIS — Z888 Allergy status to other drugs, medicaments and biological substances status: Secondary | ICD-10-CM

## 2015-10-26 DIAGNOSIS — E871 Hypo-osmolality and hyponatremia: Secondary | ICD-10-CM | POA: Diagnosis not present

## 2015-10-26 DIAGNOSIS — Z7984 Long term (current) use of oral hypoglycemic drugs: Secondary | ICD-10-CM

## 2015-10-26 DIAGNOSIS — R809 Proteinuria, unspecified: Secondary | ICD-10-CM | POA: Diagnosis present

## 2015-10-26 DIAGNOSIS — D649 Anemia, unspecified: Secondary | ICD-10-CM | POA: Diagnosis present

## 2015-10-26 DIAGNOSIS — E785 Hyperlipidemia, unspecified: Secondary | ICD-10-CM | POA: Diagnosis present

## 2015-10-26 DIAGNOSIS — J9601 Acute respiratory failure with hypoxia: Secondary | ICD-10-CM | POA: Diagnosis not present

## 2015-10-26 DIAGNOSIS — I4891 Unspecified atrial fibrillation: Secondary | ICD-10-CM | POA: Diagnosis present

## 2015-10-26 DIAGNOSIS — Z85118 Personal history of other malignant neoplasm of bronchus and lung: Secondary | ICD-10-CM

## 2015-10-26 DIAGNOSIS — Z87891 Personal history of nicotine dependence: Secondary | ICD-10-CM

## 2015-10-26 DIAGNOSIS — E222 Syndrome of inappropriate secretion of antidiuretic hormone: Secondary | ICD-10-CM | POA: Diagnosis present

## 2015-10-26 DIAGNOSIS — E119 Type 2 diabetes mellitus without complications: Secondary | ICD-10-CM | POA: Diagnosis present

## 2015-10-26 DIAGNOSIS — Z882 Allergy status to sulfonamides status: Secondary | ICD-10-CM

## 2015-10-26 DIAGNOSIS — I701 Atherosclerosis of renal artery: Secondary | ICD-10-CM | POA: Diagnosis present

## 2015-10-26 DIAGNOSIS — E781 Pure hyperglyceridemia: Secondary | ICD-10-CM | POA: Diagnosis present

## 2015-10-26 DIAGNOSIS — Z951 Presence of aortocoronary bypass graft: Secondary | ICD-10-CM

## 2015-10-26 DIAGNOSIS — R519 Headache, unspecified: Secondary | ICD-10-CM | POA: Diagnosis present

## 2015-10-26 DIAGNOSIS — Z902 Acquired absence of lung [part of]: Secondary | ICD-10-CM

## 2015-10-26 DIAGNOSIS — G9341 Metabolic encephalopathy: Secondary | ICD-10-CM | POA: Diagnosis present

## 2015-10-26 DIAGNOSIS — J9621 Acute and chronic respiratory failure with hypoxia: Secondary | ICD-10-CM | POA: Diagnosis present

## 2015-10-26 DIAGNOSIS — Z881 Allergy status to other antibiotic agents status: Secondary | ICD-10-CM

## 2015-10-26 DIAGNOSIS — R29898 Other symptoms and signs involving the musculoskeletal system: Secondary | ICD-10-CM

## 2015-10-26 DIAGNOSIS — Z7901 Long term (current) use of anticoagulants: Secondary | ICD-10-CM

## 2015-10-26 DIAGNOSIS — K219 Gastro-esophageal reflux disease without esophagitis: Secondary | ICD-10-CM | POA: Diagnosis present

## 2015-10-26 DIAGNOSIS — J9690 Respiratory failure, unspecified, unspecified whether with hypoxia or hypercapnia: Secondary | ICD-10-CM

## 2015-10-26 DIAGNOSIS — J449 Chronic obstructive pulmonary disease, unspecified: Secondary | ICD-10-CM | POA: Diagnosis present

## 2015-10-26 DIAGNOSIS — R51 Headache: Secondary | ICD-10-CM

## 2015-10-26 DIAGNOSIS — G9340 Encephalopathy, unspecified: Secondary | ICD-10-CM | POA: Insufficient documentation

## 2015-10-26 DIAGNOSIS — K5900 Constipation, unspecified: Secondary | ICD-10-CM

## 2015-10-26 DIAGNOSIS — Z91030 Bee allergy status: Secondary | ICD-10-CM

## 2015-10-26 DIAGNOSIS — I4892 Unspecified atrial flutter: Secondary | ICD-10-CM | POA: Diagnosis present

## 2015-10-26 LAB — URINE MICROSCOPIC-ADD ON

## 2015-10-26 LAB — I-STAT ARTERIAL BLOOD GAS, ED
ACID-BASE EXCESS: 20 mmol/L — AB (ref 0.0–2.0)
Bicarbonate: 49.1 mEq/L — ABNORMAL HIGH (ref 20.0–24.0)
O2 Saturation: 87 %
PH ART: 7.373 (ref 7.350–7.450)
PO2 ART: 59 mmHg — AB (ref 80.0–100.0)
TCO2: >50 mmol/L (ref 0–100)
pCO2 arterial: 84.4 mmHg (ref 35.0–45.0)

## 2015-10-26 LAB — BASIC METABOLIC PANEL
Anion gap: 9 (ref 5–15)
Anion gap: 6 (ref 5–15)
BUN: 16 mg/dL (ref 6–20)
BUN: 17 mg/dL (ref 6–20)
CHLORIDE: 79 mmol/L — AB (ref 101–111)
CO2: 40 mmol/L — ABNORMAL HIGH (ref 22–32)
CO2: 44 mmol/L — ABNORMAL HIGH (ref 22–32)
CREATININE: 0.79 mg/dL (ref 0.61–1.24)
CREATININE: 0.97 mg/dL (ref 0.61–1.24)
Calcium: 9.5 mg/dL (ref 8.9–10.3)
Calcium: 9.4 mg/dL (ref 8.9–10.3)
Chloride: 80 mmol/L — ABNORMAL LOW (ref 101–111)
GFR calc Af Amer: >60 mL/min (ref >60)
GFR calc non Af Amer: >60 mL/min (ref >60)
Glucose, Bld: 161 mg/dL — ABNORMAL HIGH (ref 65–99)
Glucose, Bld: 130 mg/dL — ABNORMAL HIGH (ref 65–99)
POTASSIUM: 4.1 mmol/L (ref 3.5–5.1)
POTASSIUM: 3.9 mmol/L (ref 3.5–5.1)
SODIUM: 128 mmol/L — AB (ref 135–145)
SODIUM: 130 mmol/L — AB (ref 135–145)

## 2015-10-26 LAB — TROPONIN I: Troponin I: <0.03 ng/mL (ref <0.03)

## 2015-10-26 LAB — PROTIME-INR
INR: 1.11 (ref 0.00–1.49)
PROTHROMBIN TIME: 14.5 s (ref 11.6–15.2)

## 2015-10-26 LAB — BLOOD GAS, ARTERIAL
ACID-BASE EXCESS: 18.1 mmol/L — AB (ref 0.0–2.0)
Acid-Base Excess: 20.2 mmol/L — ABNORMAL HIGH (ref 0.0–2.0)
Bicarbonate: 47.9 mEq/L — ABNORMAL HIGH (ref 20.0–24.0)
Bicarbonate: 44 mEq/L — ABNORMAL HIGH (ref 20.0–24.0)
DRAWN BY: 280981
Drawn by: 46066
EXPIRATORY PAP: 6
FIO2: 28
INSPIRATORY PAP: 14
O2 CONTENT: 2 L/min
O2 SAT: 97.1 %
O2 SAT: 91.3 %
PATIENT TEMPERATURE: 98.6
PATIENT TEMPERATURE: 98.2
PCO2 ART: 70.5 mmHg — AB (ref 35.0–45.0)
PH ART: 7.41 (ref 7.350–7.450)
RATE: 10 resp/min
TCO2: 51.3 mmol/L (ref 0–100)
TCO2: 46.2 mmol/L (ref 0–100)
pH, Arterial: 7.267 — ABNORMAL LOW (ref 7.350–7.450)
pO2, Arterial: 119 mmHg — ABNORMAL HIGH (ref 80.0–100.0)
pO2, Arterial: 62 mmHg — ABNORMAL LOW (ref 80.0–100.0)

## 2015-10-26 LAB — GLUCOSE, CAPILLARY
Glucose-Capillary: 120 mg/dL — ABNORMAL HIGH (ref 65–99)
Glucose-Capillary: 189 mg/dL — ABNORMAL HIGH (ref 65–99)

## 2015-10-26 LAB — CBC
HCT: 33.3 % — ABNORMAL LOW (ref 39.0–52.0)
Hemoglobin: 9.9 g/dL — ABNORMAL LOW (ref 13.0–17.0)
MCH: 26.5 pg (ref 26.0–34.0)
MCHC: 29.7 g/dL — ABNORMAL LOW (ref 30.0–36.0)
MCV: 89 fL (ref 78.0–100.0)
Platelets: 132 10*3/uL — ABNORMAL LOW (ref 150–400)
RBC: 3.74 MIL/uL — AB (ref 4.22–5.81)
RDW: 14.2 % (ref 11.5–15.5)
WBC: 10.9 10*3/uL — AB (ref 4.0–10.5)

## 2015-10-26 LAB — I-STAT TROPONIN, ED
TROPONIN I, POC: 0 ng/mL (ref 0.00–0.08)
Troponin i, poc: 0.01 ng/mL (ref 0.00–0.08)

## 2015-10-26 LAB — URINALYSIS, ROUTINE W REFLEX MICROSCOPIC
Bilirubin Urine: NEGATIVE
Glucose, UA: NEGATIVE mg/dL
Ketones, ur: NEGATIVE mg/dL
Leukocytes, UA: NEGATIVE
NITRITE: NEGATIVE
PH: 6.5 (ref 5.0–8.0)
Protein, ur: >300 mg/dL — AB
SPECIFIC GRAVITY, URINE: 1.024 (ref 1.005–1.030)

## 2015-10-26 LAB — CBG MONITORING, ED: GLUCOSE-CAPILLARY: 163 mg/dL — AB (ref 65–99)

## 2015-10-26 LAB — AMMONIA: Ammonia: 25 umol/L (ref 9–35)

## 2015-10-26 LAB — FOLATE: FOLATE: 36.2 ng/mL (ref >5.9)

## 2015-10-26 LAB — PHOSPHORUS: PHOSPHORUS: 2.5 mg/dL (ref 2.5–4.6)

## 2015-10-26 LAB — VITAMIN B12: Vitamin B-12: 357 pg/mL (ref 180–914)

## 2015-10-26 LAB — TSH: TSH: 0.8 u[IU]/mL (ref 0.350–4.500)

## 2015-10-26 LAB — APTT: APTT: 28 s (ref 24–37)

## 2015-10-26 LAB — OSMOLALITY: Osmolality: 278 mOsm/kg (ref 275–295)

## 2015-10-26 LAB — OSMOLALITY, URINE: Osmolality, Ur: 597 mOsm/kg (ref 300–900)

## 2015-10-26 LAB — SODIUM, URINE, RANDOM: Sodium, Ur: 57 mmol/L

## 2015-10-26 LAB — MRSA PCR SCREENING: MRSA BY PCR: NEGATIVE

## 2015-10-26 LAB — MAGNESIUM: MAGNESIUM: 2.2 mg/dL (ref 1.7–2.4)

## 2015-10-26 LAB — BRAIN NATRIURETIC PEPTIDE: B Natriuretic Peptide: 259.7 pg/mL — ABNORMAL HIGH (ref 0.0–100.0)

## 2015-10-26 MED ORDER — IOPAMIDOL (ISOVUE-370) INJECTION 76%
INTRAVENOUS | Status: AC
  Administered 2015-10-26: 100 mL
  Filled 2015-10-26: qty 100

## 2015-10-26 MED ORDER — ACETAMINOPHEN 325 MG PO TABS
650.0000 mg | ORAL_TABLET | Freq: Four times a day (QID) | ORAL | Status: DC | PRN
  Administered 2015-10-28: 650 mg via ORAL
  Filled 2015-10-26: qty 2

## 2015-10-26 MED ORDER — POTASSIUM CHLORIDE 10 MEQ/100ML IV SOLN
10.0000 meq | INTRAVENOUS | Status: AC
  Administered 2015-10-26 (×2): 10 meq via INTRAVENOUS
  Filled 2015-10-26 (×2): qty 100

## 2015-10-26 MED ORDER — IPRATROPIUM-ALBUTEROL 0.5-2.5 (3) MG/3ML IN SOLN
3.0000 mL | Freq: Once | RESPIRATORY_TRACT | Status: AC
  Administered 2015-10-26: 3 mL via RESPIRATORY_TRACT
  Filled 2015-10-26: qty 3

## 2015-10-26 MED ORDER — FUROSEMIDE 10 MG/ML IJ SOLN
40.0000 mg | Freq: Two times a day (BID) | INTRAMUSCULAR | Status: DC
  Administered 2015-10-26 – 2015-10-27 (×2): 40 mg via INTRAVENOUS
  Filled 2015-10-26 (×2): qty 4

## 2015-10-26 MED ORDER — ALBUTEROL SULFATE (2.5 MG/3ML) 0.083% IN NEBU: 3.0000 mL | INHALATION_SOLUTION | Freq: Four times a day (QID) | RESPIRATORY_TRACT | Status: DC | PRN

## 2015-10-26 MED ORDER — IPRATROPIUM-ALBUTEROL 0.5-2.5 (3) MG/3ML IN SOLN
3.0000 mL | Freq: Three times a day (TID) | RESPIRATORY_TRACT | Status: DC
  Administered 2015-10-26 – 2015-10-29 (×9): 3 mL via RESPIRATORY_TRACT
  Filled 2015-10-26 (×9): qty 3

## 2015-10-26 MED ORDER — METHYLPREDNISOLONE SODIUM SUCC 125 MG IJ SOLR
60.0000 mg | Freq: Once | INTRAMUSCULAR | Status: AC
  Administered 2015-10-26: 60 mg via INTRAVENOUS
  Filled 2015-10-26: qty 2

## 2015-10-26 MED ORDER — ENOXAPARIN SODIUM 100 MG/ML ~~LOC~~ SOLN
1.0000 mg/kg | Freq: Two times a day (BID) | SUBCUTANEOUS | Status: DC
  Administered 2015-10-26 – 2015-10-27 (×2): 95 mg via SUBCUTANEOUS
  Filled 2015-10-26 (×2): qty 1

## 2015-10-26 MED ORDER — KETOROLAC TROMETHAMINE 30 MG/ML IJ SOLN
30.0000 mg | Freq: Once | INTRAMUSCULAR | Status: AC
  Administered 2015-10-26: 30 mg via INTRAVENOUS
  Filled 2015-10-26: qty 1

## 2015-10-26 MED ORDER — POTASSIUM CHLORIDE CRYS ER 20 MEQ PO TBCR
20.0000 meq | EXTENDED_RELEASE_TABLET | Freq: Two times a day (BID) | ORAL | Status: DC
Start: 2015-10-27 — End: 2015-10-28
  Administered 2015-10-27 – 2015-10-28 (×3): 20 meq via ORAL
  Filled 2015-10-26 (×3): qty 1

## 2015-10-26 MED ORDER — DEXTROSE 5 % IV SOLN
2.0000 g | INTRAVENOUS | Status: DC
  Administered 2015-10-26: 2 g via INTRAVENOUS
  Filled 2015-10-26: qty 2

## 2015-10-26 MED ORDER — ACETAMINOPHEN 650 MG RE SUPP: 650.0000 mg | Freq: Four times a day (QID) | RECTAL | Status: DC | PRN

## 2015-10-26 MED ORDER — INSULIN ASPART 100 UNIT/ML ~~LOC~~ SOLN
0.0000 [IU] | SUBCUTANEOUS | Status: DC
  Administered 2015-10-26 – 2015-10-27 (×3): 2 [IU] via SUBCUTANEOUS
  Administered 2015-10-27: 1 [IU] via SUBCUTANEOUS
  Administered 2015-10-27 (×2): 2 [IU] via SUBCUTANEOUS
  Administered 2015-10-27: 1 [IU] via SUBCUTANEOUS
  Filled 2015-10-26: qty 1

## 2015-10-26 MED ORDER — METHYLPREDNISOLONE SODIUM SUCC 125 MG IJ SOLR
60.0000 mg | Freq: Four times a day (QID) | INTRAMUSCULAR | Status: DC
  Administered 2015-10-26 – 2015-10-27 (×2): 60 mg via INTRAVENOUS
  Filled 2015-10-26 (×2): qty 2

## 2015-10-26 MED ORDER — VITAMIN D 1000 UNITS PO TABS
2000.0000 [IU] | ORAL_TABLET | Freq: Two times a day (BID) | ORAL | Status: DC
  Administered 2015-10-27 – 2015-10-29 (×5): 2000 [IU] via ORAL
  Filled 2015-10-26 (×6): qty 2

## 2015-10-26 NOTE — ED Notes (Signed)
Attempted to call report

## 2015-10-26 NOTE — Progress Notes (Signed)
Pt transported to CT. Pt remained stable during transport. No complications. RT will continue to monitor.

## 2015-10-26 NOTE — ED Notes (Signed)
Pt arrives via EMS from home with chest pain onset this morning, states he felt like his chest was going to pop. Upon EMS arrival pt hypertensive 220/110 - 2 SL nitros brought BP down to 291 systolic and eased off pain. No ASA on board because patient does not have any teeth. IV established in the field. Hx hypertension, recent changes to meds but pt unaware of which ones.

## 2015-10-26 NOTE — Consult Note (Signed)
Name: ZACORY FIOLA MRN: [id number] DOB: June 20, 1932    ADMISSION DATE:  10/26/2015 CONSULTATION DATE:  10/26/15  REFERRING MD :  Cruzita Lederer  CHIEF COMPLAINT:  AMS   HISTORY OF PRESENT ILLNESS:  Pt is encephelopathic; therefore, this HPI is obtained from chart review.  CIRE CLUTE is a 82 y.o. male with a PMH as outlined below including hx lung CA s/p left pneumonectomy in 2000, and severe COPD on 2L chronic O2.  He presented to [facility] ED 07/06 with AMS along with 2 days of headache and dizziness.  He also had chest pain on morning of presentation.  Per his wife, he was acting "loopy", unable to keep balance, had loss of coordination, which prompted her to seek medical evaluation in ED.  In ED, ABG revealed respiratory acidosis with acute on chronic hypoxemic and hypercapnic respiratory failure.  He was started on BiPAP and repeat ABG demonstrated persistent hypercapnia but normal pH.  Due to his ongoing confusion and needs for BiPAP, PCCM was asked to see pt.  No hx fevers/chills/sweats, cough, exposure to known sick contacts, recent travel.   PAST MEDICAL HISTORY :   has a past medical history of Essential hypertension; Hypertriglyceridemia; CAD (coronary artery disease); Atrial myxoma; Carotid artery disease ([place]); Hearing loss; COPD (chronic obstructive pulmonary disease) ([place]); Atrial flutter with rapid ventricular response ([place]); Squamous cell lung cancer ([place]); Type II diabetes mellitus ([place]); Scarlet fever; History of pneumonia; GERD (gastroesophageal reflux disease); Daily headache; Arthritis; Depression; On home oxygen therapy; Stroke [facility]); and GI bleed (07/2015).  has past surgical history that includes Coronary artery bypass graft (07/12/2005); Pneumonectomy (Left, 05/03/1998); Combined mediastinoscopy and bronchoscopy (05/01/1998); Carotid endarterectomy (Right, 2007); Cardiac catheterization (06/2005; 01/2010); transesophageal echocardiogram (06/2005); Cardioversion (07/2005); Renal angiogram  (Bilateral, 12/2005); Esophagogastroduodenoscopy (egd) with propofol (N/A, 07/26/2015); and Colonoscopy (N/A, 07/27/2015). Prior to Admission medications   Medication Sig Start Date End Date Taking? Authorizing Provider  acetaminophen (TYLENOL) 325 MG tablet Take 325 mg by mouth every 6 (six) hours as needed for mild pain, moderate pain or headache. For pain   Yes Historical Provider, MD  albuterol (PROVENTIL HFA;VENTOLIN HFA) 108 (90 BASE) MCG/ACT inhaler Inhale 2 puffs into the lungs every 6 (six) hours as needed for wheezing.   Yes Historical Provider, MD  apixaban (ELIQUIS) 5 MG TABS tablet Take 1 tablet (5 mg total) by mouth 2 (two) times daily. 07/31/15  Yes Thurnell Lose, MD  carvedilol (COREG) 25 MG tablet Take 1 tablet (25 mg total) by mouth 2 (two) times daily. 10/18/15  Yes Burtis Junes, NP  cholecalciferol (VITAMIN D) 1000 UNITS tablet Take 2,000 Units by mouth 2 (two) times daily.    Yes Historical Provider, MD  diltiazem (CARDIZEM CD) 180 MG 24 hr capsule Take 1 capsule (180 mg total) by mouth at bedtime. 10/18/15  Yes Burtis Junes, NP  docusate sodium (COLACE) 100 MG capsule Take 2 capsules (200 mg total) by mouth daily. 07/31/15  Yes Thurnell Lose, MD  ipratropium-albuterol (DUONEB) 0.5-2.5 (3) MG/3ML SOLN Take 3 mLs by nebulization 3 (three) times daily. Patient taking differently: Take 3 mLs by nebulization 4 (four) times daily - after meals and at bedtime.  02/27/15  Yes Brand Males, MD  losartan-hydrochlorothiazide (HYZAAR) 100-25 MG per tablet Take 1 tablet by mouth daily. 01/13/15  Yes Jettie Booze, MD  magnesium oxide (MAG-OX) 400 MG tablet Take 400 mg by mouth at bedtime.    Yes Historical Provider, MD  metFORMIN (GLUCOPHAGE) 500 MG  tablet Take 500 mg by mouth daily with breakfast.  03/24/14  Yes Historical Provider, MD  Multiple Vitamins-Minerals (MULTIVITAMINS THER. W/MINERALS) TABS Take 1 tablet by mouth daily.   Yes Historical Provider, MD  NON FORMULARY Place  2 L into the nose continuous.   Yes Historical Provider, MD  Omega-3 Fatty Acids (FISH OIL) 1200 MG CAPS Take 1 capsule by mouth 2 (two) times daily.   Yes Historical Provider, MD  polyethylene glycol (MIRALAX / GLYCOLAX) packet Take 17 g by mouth 2 (two) times daily. Patient taking differently: Take 17 g by mouth daily.  07/31/15  Yes Thurnell Lose, MD  triamcinolone cream (KENALOG) 0.1 % Apply 1 application topically 2 (two) times daily as needed ([place]).  04/18/15  Yes Historical Provider, MD  cetirizine (ZYRTEC) 10 MG tablet Take 10 mg by mouth daily as needed for allergies or rhinitis. For allergies    Historical Provider, MD  EPIPEN 2-PAK 0.3 MG/0.3ML DEVI Inject 0.3 mg into the skin See admin instructions. Take as needed for severe allergic reaction 11/04/11   Historical Provider, MD  pantoprazole (PROTONIX) 40 MG tablet Take 1 tablet (40 mg total) by mouth 2 (two) times daily. Patient not taking: Reported on 10/26/2015 07/31/15   Thurnell Lose, MD   Allergies  Allergen Reactions  . Bee Venom Anaphylaxis  . Sulfa Antibiotics   . Amoxicillin Rash  . Statins Other (See Comments)    Muscle aches    FAMILY HISTORY:  family history includes Cancer in his father and sister; Diabetes in his mother; Heart attack in his mother; Heart disease in his brother, mother, and sister; Hyperlipidemia in his brother, father, and mother; Hypertension in his sister; Stomach cancer in his father. SOCIAL HISTORY:  reports that he quit smoking about 18 years ago. His smoking use included Cigarettes. He has a 86 pack-year smoking history. He has never used smokeless tobacco. He reports that he drinks alcohol. He reports that he does not use illicit drugs.  REVIEW OF SYSTEMS:  Unable to obtain as pt is encephalopathic.   SUBJECTIVE:   VITAL SIGNS: Temp:  [97.9 F (36.6 C)-98.1 F (36.7 C)] 97.9 F (36.6 C) (07/06 1002) Pulse Rate:  [25-105] 65 (07/06 1519) Resp:  [12-29] 20 (07/06 1519) BP:  (85-183)/(35-129) 137/65 mmHg (07/06 1430) SpO2:  [89 %-100 %] 94 % (07/06 1519) FiO2 (%):  [30 %] 30 % (07/06 1250)  PHYSICAL EXAMINATION: General: Elderly male, resting in bed, in NAD. Profound hearing loss. Neuro: Awake but confused.  Unable to tell [place] where he is, what year it is, president of [place]. HEENT: [place]/AT. PERRL, sclerae anicteric. Cardiovascular: RRR, no M/R/G.  Lungs: Respirations unlabored.  Faint crackles on right.  Absent BS on left (hx pneumonectomy). Abdomen: BS x 4, soft, NT/ND.  Musculoskeletal: No gross deformities, no edema.  Skin: Intact, warm, no rashes.     Recent Labs Lab 10/26/15 0616 10/26/15 1514  NA 128* 130*  K 4.1 3.9  CL 79* 80*  CO2 40* 44*  BUN 16 17  CREATININE 0.79 0.97  GLUCOSE 161* 130*    Recent Labs Lab 10/26/15 0616  HGB 9.9*  HCT 33.3*  WBC 10.9*  PLT 132*   Dg Chest 2 View  10/26/2015  CLINICAL DATA:  Chest pain and short of breath EXAM: CHEST  2 VIEW COMPARISON:  09/22/2015 FINDINGS: Left pneumonectomy. Extensive pleural calcification on the left is unchanged. Pulmonary vascular congestion is present in the right lung suggesting mild fluid overload.  No edema or effusion. Negative for pneumonia. IMPRESSION: Left pneumonectomy. Mild pulmonary vascular congestion is present in the right lung without edema. Electronically Signed   By: Franchot Gallo M.D.   On: 10/26/2015 07:34   Dg Abd Portable 1v  10/26/2015  CLINICAL DATA:  Constipation EXAM: PORTABLE ABDOMEN - 1 VIEW COMPARISON:  07/04/2005 CT abdomen/pelvis FINDINGS: No dilated small bowel loops. Mild-to-moderate colonic stool volume. No evidence of pneumatosis, pneumoperitoneum or pathologic soft tissue calcifications. Moderate lumbar spondylosis. IMPRESSION: Nonobstructive bowel gas pattern. Mild-to-moderate colorectal stool volume. Electronically Signed   By: Ilona Sorrel M.D.   On: 10/26/2015 13:25    STUDIES:  CXR 07/06 > left pneumonectomy, mild vascular congestion on  right.  SIGNIFICANT EVENTS  07/06 > admitted.  ASSESSMENT / PLAN:  Acute on chronic hypoxemic and hypercapnic respiratory failure. COPD with possible mild exacerbation. Mild pulmonary edema. Hx lung CA s/p left pneumonectomy in 2000. Plan: Continuous BiPAP, 4 hours on and 1 hour off. Repeat ABG tonight. Given his acute decompensation, can not rule out PE (though lower on list of DDx); therefore, will assess CTA chest and LE duplex. Continue BD's, steroids. Add ceftriaxone for AECOPD. Start furosemide '40mg'$  BID, consider stop after 1 - 2 days. CXR in AM.  Acute encephalopathy - likely exacerbated by hypercapnia (worse compared to baseline). Plan: BiPAP as above. Assess CT head. Assess TSH, folate, B12, ammonia.  Rest per primary team.   Montey Hora, PA - [facility] Pager: [phone]  or [phone] 10/26/2015, 3:44 PM    STAFF NOTE: I, Merrie Roof, MD FACP have personally reviewed patient's available data, including medical history, events of note, physical examination and test results as part of my evaluation. I have discussed with resident/NP and other care providers such as pharmacist, RN and RRT. In addition, I personally evaluated patient and elicited key findings of:  Alert, confused, O x 1-2, improved resp status on NIMV, No BS left, reduced BS rt, no overt bronchospasm noted, agree to steroids, Bders, ABG is improved, keep scheduled NIMV 4 hr on 1 hr off, would not send for MRI brain as may decline with such a long study on back, would consider to r/p PE, consider CT chest angio with this presentation, add ct head while does there, likely part of his confusion is from acidosis, need to discuss code status and intubation wishes with family, unlikely to do well with intuibation and liberation, does not appear to be infected, no family at bedside, no repeat abg needed unless declines The patient is critically ill with multiple  organ systems failure and requires high complexity decision making for assessment and support, frequent evaluation and titration of therapies, application of advanced monitoring technologies and extensive interpretation of multiple databases.   Critical Care Time devoted to patient care services described in this note is 40 Minutes. This time reflects time of care of this signee: Merrie Roof, MD FACP. This critical care time does not reflect procedure time, or teaching time or supervisory time of PA/NP/Med student/Med Resident etc but could involve care discussion time. Rest per NP/medical resident whose note is outlined above and that I agree with   Lavon Paganini. Titus Mould, MD, [place] Pgr: [facility] 10/26/2015 5:00 PM

## 2015-10-26 NOTE — ED Notes (Signed)
Voids to urinal while standing

## 2015-10-26 NOTE — Progress Notes (Signed)
L Harduk NP night coverage with [facility] made aware of ABGs results and elevated BP.Nursing to call back if BP remains elevated.

## 2015-10-26 NOTE — Progress Notes (Signed)
Pt transported on BiPAP to 2H16 without incident.

## 2015-10-26 NOTE — Progress Notes (Signed)
Patient taken off of BIPAP per order and placed on 4LNC.

## 2015-10-26 NOTE — ED Notes (Signed)
Pt calls out for nurse and staets that he needs psychiatrist because his "head is messed up". Pt is very hard of hearing and cannot elaborate beyond messed up. Denies thoughts of hurting self.Pt is oriented to place but not time. Remembers telling EMS about chest pain but now c/o pain at right side of head.

## 2015-10-26 NOTE — H&P (Signed)
History and Physical    Calvin [name] JJK:[id number] DOB: 17-May-1932 DOA: 10/26/2015   PCP:  [name], MD   Patient coming from/Resides with: Private residence/lives with wife  Chief Complaint: Generalized weakness with altered mentation with recent reports of severe headache and chest pain  HPI: Calvin [name] is a 82 y.o. male with medical history significant for lung cancer prior left pneumonectomy 17 years ago, none CAD with CABG 10 years ago, atrial fibrillation on eliquis, hypertension, O2 dependent COPD, diabetes on metformin, known carotid artery disease, profound hearing loss, and dyslipidemia. Patient was brought to the hospital today via EMS after having at least 2 days of severe headache and dizziness. He apparently began complaining of chest pain this morning. History primarily obtained from the patient's wife and the chart patient's profound hearing loss and difficulty obtaining accurate history. In addition to those above complaints the patient has been experiencing generalized weakness beginning this past Sunday. In discussing with the wife at baseline the patient previously has been independent and is able to pull his on oxygen tank and perform activities such as mowing the lawn when she last accomplished independently this past Friday. Sunday he was noted with generalized weakness symptoms (self-reported) as well as progressive confusion. The wife reports that when patient mows the lawn he typically drinks "plenty of water" but does not ingest excessive amounts of water on a daily basis. He has not had any nausea vomiting or diarrhea. Wife has noted decreased urinary output over the past several days. He is had no difficulty eating or drinking although the wife thinks that since Sunday his intake is decreased in relation to his decreased mentation. Typically on 1.5 L of oxygen but in the past week has increased to 2 L/m. Wife reports that even with oxygen in place at home they have noticed  his O2 saturations dropping to 70%. He has not had any upper respiratory infection symptoms. Because of this weakness for the past 2 days he has required a rolling walker to mobilize. He has not had any medication changes. His CBGs have been controlled and less than 200 consistently. Prior to today he has not had any chest pain or worsening of his chronic dyspnea on exertion and shortness of breath.  ED Course:  Vital signs: PO temp 98.1-BP 172/71-pulse 80-respirations 25-O2 saturations 99% on 3 L oxygen Two-view chest x-ray: Prior left pneumonectomy with mild pulmonary vascular congestion and right lung without edema-and compared to previous films unchanged MRI of brain and cervical spine: Pending at time of initial evaluation Lab data: Sodium 128, potassium 4.1, CO2 40, BUN 16, creatinine 0.79, glucose 161, anion gap 9, poc troponin 2 collections (0.01 and 0.00), white count 10,900 differential not obtained, hemoglobin 9.9, platelets 132,000, PT 14.5, 1.11, PTT 28, urinalysis unremarkable except for few bacteria, small amount of hemoglobin, negative ketones, negative nitrite, protein greater than 300, specific gravity 1.024, urine culture pending ** (Obtained after admission) ABG: pH 7.268, pCO2 >100, pO2 113, HCO3 48, ABE 20 Medications and treatments: None  Review of Systems:  In addition to the HPI above,  No Fever-chills, myalgias or other constitutional symptoms No changes with Vision or hearing, new weakness, tingling, numbness in any extremity No problems swallowing food or Liquids, indigestion/reflux No Cough, no change in chronic Shortness of Breath, reported palpitations, orthopnea or change in chronic DOE No Abdominal pain, N/V; no melena or hematochezia, no dark tarry stools, wife reports symptoms consistent with constipation No dysuria, hematuria or flank  pain No new skin rashes, lesions, masses or bruises, No new joints pains-aches No recent weight gain or loss No polyuria,  polydypsia or polyphagia,   Past Medical History  Diagnosis Date  . Essential hypertension   . Hypertriglyceridemia   . CAD (coronary artery disease)     a. 06/2005 s/p CABG x1 (VG->RCA) @ time of myxoma resection.  . 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  . Carotid artery disease ([place])     a. 06/2005 s/p R CEA;  b. 10/2013 Carotid U/S: patent RICA, LICA 80-99%;  c. 11/3380 U/S: RICA 50-53%, LICA 97-67%.  . Hearing loss   . COPD (chronic obstructive pulmonary disease) ([place])   . Atrial flutter with rapid ventricular response ([place])     a. 2007 s/p DCCV following CV surgery;  b. 10/2014->CHA2DS2VASc = 5-->Xarelto.  . Squamous cell lung cancer ([place])     a. s/p L pneumonectomy in 2000.  . Type II diabetes mellitus ([place])   . Scarlet fever     a. ~ 1942.  Marland Kitchen History of pneumonia   . GERD (gastroesophageal reflux disease)   . Daily headache   . Arthritis   . Depression   . On home oxygen therapy     "2L; 24/7" (11/03/2014)  . Stroke ([place])   . GI bleed 07/2015    Past Surgical History  Procedure Laterality Date  . Coronary artery bypass graft  07/12/2005    Dr Servando Snare, CABG x 1 with reverse saphenous vein graft to distal rt coronary artery and excision of left atrial myxoma [Other]  . Pneumonectomy Left 05/03/1998    Dr Servando Snare  . Combined mediastinoscopy and bronchoscopy  05/01/1998    Dr Servando Snare  . Carotid endarterectomy Right 2007  . Cardiac catheterization  06/2005; 01/2010    Archie Endo 10/06/2010; Archie Endo 02/02/2010  . Transesophageal echocardiogram  06/2005    Archie Endo 09/04/2010  . Cardioversion  07/2005    DCC/notes 09/04/2010  . Renal angiogram Bilateral 12/2005    Archie Endo 09/04/2010  . Esophagogastroduodenoscopy (egd) with propofol N/A 07/26/2015    Procedure: ESOPHAGOGASTRODUODENOSCOPY (EGD) WITH PROPOFOL;  Surgeon: Wonda Horner, MD;  Location: [facility] ENDOSCOPY;  Service: Endoscopy;  Laterality: N/A;  . Colonoscopy N/A 07/27/2015    Procedure: COLONOSCOPY;  Surgeon: Wonda Horner, MD;  Location[facility] ENDOSCOPY;  Service: Endoscopy;  Laterality: N/A;    Social History   Social History  . Marital Status: Married    Spouse Name: N/A  . Number of Children: N/A  . Years of Education: N/A   Occupational History  . retired    Social History Main Topics  . Smoking status: Former Smoker -- 2.00 packs/day for 43 years    Types: Cigarettes    Quit date: 07/21/1997  . Smokeless tobacco: Never Used  . Alcohol Use: 0.0 oz/week    0 Standard drinks or equivalent per week     Comment: 11/03/2014 "never drank much; stopped in the early 1970's"  . Drug Use: No  . Sexual Activity: Not Currently   Other Topics Concern  . Not on file   Social History Narrative    Mobility: Typically without assistive devices although for the past 2 days requiring rolling walker Work history: Retired   Allergies  Allergen Reactions  . Bee Venom Anaphylaxis  . Sulfa Antibiotics   . Amoxicillin Rash  . Statins Other (See Comments)    Muscle aches    Family History  Problem Relation  Age of Onset  . Heart disease Mother     NOT before age 96  . Heart attack Mother   . Diabetes Mother   . Hyperlipidemia Mother   . Heart disease Sister   . Cancer Sister     Breast  . Hypertension Sister   . Stomach cancer Father   . Cancer Father     Stomach  . Hyperlipidemia Father   . Hyperlipidemia Brother   . Heart disease Brother      Prior to Admission medications   Medication Sig Start Date End Date Taking? Authorizing Provider  acetaminophen (TYLENOL) 325 MG tablet Take 325 mg by mouth every 6 (six) hours as needed for mild pain, moderate pain or headache. For pain    Historical Provider, MD  albuterol (PROVENTIL HFA;VENTOLIN HFA) 108 (90 BASE) MCG/ACT inhaler Inhale 2 puffs into the lungs every 6 (six) hours as needed for wheezing.    Historical Provider, MD  apixaban (ELIQUIS) 5 MG TABS tablet Take 1 tablet (5 mg total) by mouth 2 (two) times daily. 07/31/15   Thurnell Lose, MD  carvedilol (COREG) 25 MG tablet Take 1 tablet (25 mg total) by mouth 2 (two) times daily. 10/18/15   Burtis Junes, NP  cetirizine (ZYRTEC) 10 MG tablet Take 10 mg by mouth daily as needed for allergies or rhinitis. For allergies    Historical Provider, MD  cholecalciferol (VITAMIN D) 1000 UNITS tablet Take 2,000 Units by mouth 2 (two) times daily.     Historical Provider, MD  diltiazem (CARDIZEM CD) 180 MG 24 hr capsule Take 1 capsule (180 mg total) by mouth at bedtime. 10/18/15   Burtis Junes, NP  docusate sodium (COLACE) 100 MG capsule Take 2 capsules (200 mg total) by mouth daily. 07/31/15   Thurnell Lose, MD  EPIPEN 2-PAK 0.3 MG/0.3ML DEVI Inject 0.3 mg into the skin See admin instructions. Take as needed for severe allergic reaction 11/04/11   Historical Provider, MD  ipratropium-albuterol (DUONEB) 0.5-2.5 (3) MG/3ML SOLN Take 3 mLs by nebulization 3 (three) times daily. 02/27/15   Brand Males, MD  losartan-hydrochlorothiazide (HYZAAR) 100-25 MG per tablet Take 1 tablet by mouth daily. 01/13/15   Jettie Booze, MD  magnesium oxide (MAG-OX) 400 MG tablet Take 400 mg by mouth at bedtime.     Historical Provider, MD  metFORMIN (GLUCOPHAGE) 500 MG tablet Take 500 mg by mouth daily with breakfast.  03/24/14   Historical Provider, MD  Multiple Vitamins-Minerals (MULTIVITAMINS THER. W/MINERALS) TABS Take 1 tablet by mouth daily.    Historical Provider, MD  NON FORMULARY Place 2 L into the nose continuous.    Historical Provider, MD  Omega-3 Fatty Acids (FISH OIL) 1200 MG CAPS Take 1 capsule by mouth 2 (two) times daily.    Historical Provider, MD  pantoprazole (PROTONIX) 40 MG tablet Take 1 tablet (40 mg total) by mouth 2 (two) times daily. 07/31/15   Thurnell Lose, MD  polyethylene glycol (MIRALAX / GLYCOLAX) packet Take 17 g by mouth 2 (two) times daily. 07/31/15   Thurnell Lose, MD  triamcinolone cream (KENALOG) 0.1 %  04/18/15   Historical Provider, MD    Physical  Exam: Filed Vitals:   10/26/15 1045 10/26/15 1115 10/26/15 1145 10/26/15 1200  BP: 167/129 166/74 162/80 121/58  Pulse: 76 70 71 49  Temp:      TempSrc:      Resp: '21 20 18 14  '$ SpO2: 96% 96% 97% 98%  Constitutional: NAD, Restless but not combative, nontoxic in appearance Eyes: PERRL, lids and conjunctivae normal ENMT: Mucous membranes are dry. Posterior pharynx clear of any exudate or lesions. Neck: normal, supple, no masses, no thyromegaly Respiratory: clear to auscultation on right although diminished with a few scattered expiratory wheezes, no crackles. Normal respiratory effort. No accessory muscle use. 3 L nasal cannula oxygen Cardiovascular: Regular rate and rhythm, no murmurs / rubs / gallops. No extremity edema. 2+ pedal pulses. No carotid bruits.  Abdomen: no tenderness, no masses palpated. No hepatosplenomegaly. Bowel sounds positive. Nightly distended but nontender that it Musculoskeletal: no clubbing / cyanosis. No joint deformity upper and lower extremities. Good spontaneous ROM, no contractures. Normal muscle tone.  Skin: no rashes, lesions, ulcers. No induration Neurologic: CN 2-12 grossly intact. Sensation intact, DTR normal. Strength 5/5 x all 4 extremities based on observation of patient repositioning self repeatedly on the stretcher-because of hearing loss and altered mentation was unable to follow commands adequately to perform formal exam.  Psychiatric: Judgment and insight appear to be acutely impaired but again difficult to evaluate secondary to hearing loss. Alert and oriented x 3. Flat affect/ mood.    Labs on Admission: I have personally reviewed following labs and imaging studies  CBC:  Recent Labs Lab 10/26/15 0616  WBC 10.9*  HGB 9.9*  HCT 33.3*  MCV 89.0  PLT 798*   Basic Metabolic Panel:  Recent Labs Lab 10/26/15 0616  NA 128*  K 4.1  CL 79*  CO2 40*  GLUCOSE 161*  BUN 16  CREATININE 0.79  CALCIUM 9.5   GFR: Estimated Creatinine  Clearance: 84.8 mL/min (by C-G formula based on Cr of 0.79). Liver Function Tests: No results for input(s): AST, ALT, ALKPHOS, BILITOT, PROT, ALBUMIN in the last 168 hours. No results for input(s): LIPASE, AMYLASE in the last 168 hours. No results for input(s): AMMONIA in the last 168 hours. Coagulation Profile:  Recent Labs Lab 10/26/15 0616  INR 1.11   Cardiac Enzymes: No results for input(s): CKTOTAL, CKMB, CKMBINDEX, TROPONINI in the last 168 hours. BNP (last 3 results) No results for input(s): PROBNP in the last 8760 hours. HbA1C: No results for input(s): HGBA1C in the last 72 hours. CBG: No results for input(s): GLUCAP in the last 168 hours. Lipid Profile: No results for input(s): CHOL, HDL, LDLCALC, TRIG, CHOLHDL, LDLDIRECT in the last 72 hours. Thyroid Function Tests: No results for input(s): TSH, T4TOTAL, FREET4, T3FREE, THYROIDAB in the last 72 hours. Anemia Panel: No results for input(s): VITAMINB12, FOLATE, FERRITIN, TIBC, IRON, RETICCTPCT in the last 72 hours. Urine analysis:    Component Value Date/Time   COLORURINE YELLOW 10/26/2015 1031   APPEARANCEUR CLEAR 10/26/2015 1031   LABSPEC 1.024 10/26/2015 1031   PHURINE 6.5 10/26/2015 1031   GLUCOSEU NEGATIVE 10/26/2015 1031   HGBUR SMALL* 10/26/2015 1031   BILIRUBINUR NEGATIVE 10/26/2015 1031   KETONESUR NEGATIVE 10/26/2015 1031   PROTEINUR >300* 10/26/2015 1031   UROBILINOGEN 0.2 06/02/2007 2336   NITRITE NEGATIVE 10/26/2015 1031   LEUKOCYTESUR NEGATIVE 10/26/2015 1031   Sepsis Labs: '@LABRCNTIP'$ (procalcitonin:4,lacticidven:4) )No results found for this or any previous visit (from the past 240 hour(s)).   Radiological Exams on Admission: Dg Chest 2 View  10/26/2015  CLINICAL DATA:  Chest pain and short of breath EXAM: CHEST  2 VIEW COMPARISON:  09/22/2015 FINDINGS: Left pneumonectomy. Extensive pleural calcification on the left is unchanged. Pulmonary vascular congestion is present in the right lung suggesting  mild fluid overload. No edema or effusion.  Negative for pneumonia. IMPRESSION: Left pneumonectomy. Mild pulmonary vascular congestion is present in the right lung without edema. Electronically Signed   By: Franchot Gallo M.D.   On: 10/26/2015 07:34    EKG: (Independently reviewed) sinus rhythm with ventricular rate 77 bpm, QTC 07/21/2024 ms, elevated J point in inferior leads and to a lesser degree in leads V5 and V6 without any acute ischemic changes and unchanged from previous EKG  Assessment/Plan Principal Problem:   Acute on chronic respiratory failure with hypoxia and hypercarbia/ COPD (chronic obstructive pulmonary disease)  -Patient has presented with severe headache, chest pain and some shortness of breath about coughing; and chronic oxygen 1.5 with recent increase to 2 L at home and now on 2 L here -Since initially evaluated ABG has returned consistent with hypercarbic respiratory failure with PCO2 greater than 100 and a pH of 7.268 -Suspect even mild COPD exacerbation in a patient with solitary long can contribute to significant respiratory compromise -BiPAP with possibility of repeating ABG in 2-3 hours; follow clinically -Solu-Medrol 60 mg IV 1-monitor response and consider continuing after admission -DuoNeb now and continue preadmission 3 times daily dosing -No focal infiltrate on chest x-ray so do not suspect infectious or pneumonia process -Does have hyponatremia which could be related to volume overload although x-ray appears to be stable so we'll continue to monitor (see below)  Active Problems:   Accelerated hypertension/Headache -Patient presented with uncontrolled hypertension and headache without focal neurological deficits although with reports of generalized weakness -EDP discuss with neurology who recommended MRI of head and neck chest pending at time of dictation -Blood pressure has improved without treatment with any medications with current reading of  121/58 -Continue preadmission antihypertensive medications: Cardizem CD, carvedilol and losartan but holding hydrochlorothiazide in setting of hyponatremia    Acute hyponatremia -Etiology unclear noting patient on thiazide diuretic prior to admission further confounding issue although no documented history of chronic hyponatremia and has been on this medication for 3 years -Has not started any new medications such as an SSRI -Urine specific gravity higher than baseline and does have proteinuria so unclear if actually volume depleted -Was not orthostatic when checked -Check serum and urine osmolality as well as random urine sodium to better clarify -Consider giving one time normal saline bolus to 50 mL an repeating electrolyte panel 2 hours later to see if sodium increases which would indicate dehydration; if decreases concerning for volume overload and may require diuresis or fluid restriction and consideration of SIADH  ** Serum osmolality 278 and urine osmolality 597 with a urine sodium 57-suspect related to thiazide diuretic with acute change in Na related to development of acute illness-serum chloride is normal so less likely this is related to hypovolemia and no edema so doubt related to volume overload    Metabolic encephalopathy -Multifactorial related to acute hypercarbia as well as acute hyponatremia -Treat underlying causes -Failed bedside swallow eval and ER so NPO -With reports of generalized weakness will check magnesium and phosphorus as well     Proteinuria -New finding with protein reported greater than 300 with normal renal function and small amount of hemoglobin on urinalysis -Most likely cause is UTI so follow-up on urine culture initially-no evidence of infection then explore other causes of proteinuria    Chest pain, atypical/CAD (coronary artery disease), CABG 2007 -Patient apparently reported to ED staff and to wife he was having chest pain but currently is chest  pain-free and until today apparently had not been experiencing chest pain -  Given history of known CAD cycle troponin -Last echocardiogram was in 2013 so we'll obtain this as well -Initial EKG in point of care troponin is reassuring    Diabetes mellitus  -On metformin prior to admission and on hold now during acute illness -Check hemoglobin A1c -CBGs and SSI    Thrombocytopenia  -New problem -Possibly related to UTI as a follow-up on urine culture -CBC in a.m. -Platelets greater than 100,000    History of lung cancer/Hx of pneumonectomy -On chronic oxygen and at home wife reports issues of exertional hypoxemia with saturations down to 70% -See above regarding COPD and acute on chronic respiratory failure    Atrial fibrillation -Only maintaining normal sinus rhythm -Continue eliquis -Continue carvedilol  -CHADVASc = 5    Hyperlipidemia -Continue omega-3 fatty acids      DVT prophylaxis: Eliquis  Code Status: Full code  Family Communication: Wife at bedside  Disposition Plan: Anticipate discharged back to preadmission home environment once medically stable  Consults called: None  Admission status: Stepdown/observation     ELLIS,ALLISON L. ANP-BC [facility] Pager [phone]   If 7PM-7AM, please contact night-coverage [email or web address] Password [facility]  10/26/2015, 12:29 PM

## 2015-10-26 NOTE — ED Notes (Signed)
Attempt to call report. Nurse state she will call back in 5 minutes.

## 2015-10-26 NOTE — Progress Notes (Addendum)
Patient with acute on chronic hypercarbic respiratory failure with hypoxemia and clinical findings that appear more consistent with COPD exacerbation therefore will continue scheduled Duonebs and after giving a one-time dose of Solu-Medrol have opted to continue Solu-Medrol scheduled every 6 hours. Plan on obtaining an ABG 3 hours after application of BiPAP and if improved will change BiPAP to prn. Scheduled MRI placed on hold until 4 hours after BiPAP initiated to allow for follow-up ABG to be obtained and resulted prior to sending to MRI without BiPAP in place. BNP 260.  1455: Per attending pt now almost unresponsive on exam- repeat ABG pending at 3 pm. Have placed page to PCCM to evaluate pt.  Have dc'd all PO meds due to AMS. Lovenox 1 mg/kg q 12 hrs to replace Eliquis  1500: Repeat ABG shows improvement in pH and PCO2 but now PO2 down to 59. Review of old ABG on nasal cannula oxygen shows baseline PCO2 79 and baseline PO2 76. Discussed with Dr. Titus Mould and PCCM will come eval pt  Erin Hearing, ANP

## 2015-10-26 NOTE — ED Provider Notes (Signed)
CSN: [id number]     Arrival date & time 10/26/15  0607 History   First MD Initiated Contact with Patient 10/26/15 0622     Chief Complaint  Patient presents with  . Chest Pain     (Consider location/radiation/quality/duration/timing/severity/associated sxs/prior Treatment) Patient is a 82 y.o. male presenting with chest pain. The history is provided by the patient and medical records. No language interpreter was used.  Chest Pain Associated symptoms: no abdominal pain, no back pain, no cough, no fever, no headache, no nausea, no shortness of breath and not vomiting    EDGE MAUGER is a 82 y.o. male  with a PMH of HTN, HLD, CAD s/p CABG x 1 in 2007, COPD, DM who presents to the Emergency Department complaining of chest pain which began this morning and feels similar to pain he has experienced in the past. When EMS arrived, patient was hypertensive at 220/110. He was given 2 SL nitro which improved pain and brought BP down to 557'D systolic. He saw his cardiologist on 6/28 where his BP meds were adjusted. He was on coreg BID, diltiazem added at night per chart review. Denies difficultly breathing, abdominal pain, or any other complaints at this time. On 2L O2 at home.   Patient very hard of hearing which limited initial history. Wife now at bedside who states that patient has been experiencing worsening muscle weakness since Sunday. He has been dropping items which is unusual for him and when he is walking, his legs will "give out" which typically does not happen. She states that when he awoke this morning he complained that of new chest pain.   Past Medical History  Diagnosis Date  . Essential hypertension   . Hypertriglyceridemia   . CAD (coronary artery disease)     a. 06/2005 s/p CABG x1 (VG->RCA) @ time of myxoma resection.  . 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  . Carotid artery disease ([place])     a. 06/2005 s/p R CEA;  b. 10/2013 Carotid U/S: patent RICA, LICA 22-02%;  c.  08/4268 U/S: RICA 62-37%, LICA 62-83%.  . Hearing loss   . COPD (chronic obstructive pulmonary disease) ([place])   . Atrial flutter with rapid ventricular response ([place])     a. 2007 s/p DCCV following CV surgery;  b. 10/2014->CHA2DS2VASc = 5-->Xarelto.  . Squamous cell lung cancer ([place])     a. s/p L pneumonectomy in 2000.  . Type II diabetes mellitus ([place])   . Scarlet fever     a. ~ 1942.  Marland Kitchen History of pneumonia   . GERD (gastroesophageal reflux disease)   . Daily headache   . Arthritis   . Depression   . On home oxygen therapy     "2L; 24/7" (11/03/2014)  . Stroke ([place])   . GI bleed 07/2015   Past Surgical History  Procedure Laterality Date  . Coronary artery bypass graft  07/12/2005    Dr Servando Snare, CABG x 1 with reverse saphenous vein graft to distal rt coronary artery and excision of left atrial myxoma [Other]  . Pneumonectomy Left 05/03/1998    Dr Servando Snare  . Combined mediastinoscopy and bronchoscopy  05/01/1998    Dr Servando Snare  . Carotid endarterectomy Right 2007  . Cardiac catheterization  06/2005; 01/2010    Archie Endo 10/06/2010; Archie Endo 02/02/2010  . Transesophageal echocardiogram  06/2005    Archie Endo 09/04/2010  . Cardioversion  07/2005    DCC/notes 09/04/2010  . Renal angiogram  Bilateral 12/2005    Archie Endo 09/04/2010  . Esophagogastroduodenoscopy (egd) with propofol N/A 07/26/2015    Procedure: ESOPHAGOGASTRODUODENOSCOPY (EGD) WITH PROPOFOL;  Surgeon: Wonda Horner, MD;  Location: [facility] ENDOSCOPY;  Service: Endoscopy;  Laterality: N/A;  . Colonoscopy N/A 07/27/2015    Procedure: COLONOSCOPY;  Surgeon: Wonda Horner, MD;  Location: [facility] ENDOSCOPY;  Service: Endoscopy;  Laterality: N/A;   Family History  Problem Relation Age of Onset  . Heart disease Mother     NOT before age 32  . Heart attack Mother   . Diabetes Mother   . Hyperlipidemia Mother   . Heart disease Sister   . Cancer Sister     Breast  . Hypertension Sister   . Stomach cancer Father   . Cancer Father     Stomach  .  Hyperlipidemia Father   . Hyperlipidemia Brother   . Heart disease Brother    Social History  Substance Use Topics  . Smoking status: Former Smoker -- 2.00 packs/day for 43 years    Types: Cigarettes    Quit date: 07/21/1997  . Smokeless tobacco: Never Used  . Alcohol Use: 0.0 oz/week    0 Standard drinks or equivalent per week     Comment: 11/03/2014 "never drank much; stopped in the early 1970's"    Review of Systems  Constitutional: Negative for fever.  HENT: Negative for congestion.   Eyes: Negative for visual disturbance.  Respiratory: Negative for cough and shortness of breath.   Cardiovascular: Positive for chest pain. Negative for leg swelling.  Gastrointestinal: Negative for nausea, vomiting and abdominal pain.  Genitourinary: Negative for dysuria.  Musculoskeletal: Negative for back pain.  Skin: Negative for rash.  Neurological: Negative for headaches.      Allergies  Bee venom; Sulfa antibiotics; Amoxicillin; and Statins  Home Medications   Prior to Admission medications   Medication Sig Start Date End Date Taking? Authorizing Provider  acetaminophen (TYLENOL) 325 MG tablet Take 325 mg by mouth every 6 (six) hours as needed for mild pain, moderate pain or headache. For pain    Historical Provider, MD  albuterol (PROVENTIL HFA;VENTOLIN HFA) 108 (90 BASE) MCG/ACT inhaler Inhale 2 puffs into the lungs every 6 (six) hours as needed for wheezing.    Historical Provider, MD  apixaban (ELIQUIS) 5 MG TABS tablet Take 1 tablet (5 mg total) by mouth 2 (two) times daily. 07/31/15   Thurnell Lose, MD  carvedilol (COREG) 25 MG tablet Take 1 tablet (25 mg total) by mouth 2 (two) times daily. 10/18/15   Burtis Junes, NP  cetirizine (ZYRTEC) 10 MG tablet Take 10 mg by mouth daily as needed for allergies or rhinitis. For allergies    Historical Provider, MD  cholecalciferol (VITAMIN D) 1000 UNITS tablet Take 2,000 Units by mouth 2 (two) times daily.     Historical Provider, MD   diltiazem (CARDIZEM CD) 180 MG 24 hr capsule Take 1 capsule (180 mg total) by mouth at bedtime. 10/18/15   Burtis Junes, NP  docusate sodium (COLACE) 100 MG capsule Take 2 capsules (200 mg total) by mouth daily. 07/31/15   Thurnell Lose, MD  EPIPEN 2-PAK 0.3 MG/0.3ML DEVI Inject 0.3 mg into the skin See admin instructions. Take as needed for severe allergic reaction 11/04/11   Historical Provider, MD  ipratropium-albuterol (DUONEB) 0.5-2.5 (3) MG/3ML SOLN Take 3 mLs by nebulization 3 (three) times daily. 02/27/15   Brand Males, MD  losartan-hydrochlorothiazide (HYZAAR) 100-25 MG per tablet Take  1 tablet by mouth daily. 01/13/15   Jettie Booze, MD  magnesium oxide (MAG-OX) 400 MG tablet Take 400 mg by mouth at bedtime.     Historical Provider, MD  metFORMIN (GLUCOPHAGE) 500 MG tablet Take 500 mg by mouth daily with breakfast.  03/24/14   Historical Provider, MD  Multiple Vitamins-Minerals (MULTIVITAMINS THER. W/MINERALS) TABS Take 1 tablet by mouth daily.    Historical Provider, MD  NON FORMULARY Place 2 L into the nose continuous.    Historical Provider, MD  Omega-3 Fatty Acids (FISH OIL) 1200 MG CAPS Take 1 capsule by mouth 2 (two) times daily.    Historical Provider, MD  pantoprazole (PROTONIX) 40 MG tablet Take 1 tablet (40 mg total) by mouth 2 (two) times daily. 07/31/15   Thurnell Lose, MD  polyethylene glycol (MIRALAX / GLYCOLAX) packet Take 17 g by mouth 2 (two) times daily. 07/31/15   Thurnell Lose, MD  triamcinolone cream (KENALOG) 0.1 %  04/18/15   Historical Provider, MD   BP 183/99 mmHg  Pulse 73  Temp(Src) 97.9 F (36.6 C) (Oral)  Resp 21  SpO2 94% Physical Exam  Constitutional: He is oriented to person, place, and time. He appears well-developed and well-nourished. No distress.  Hard of hearing.   HENT:  Head: Normocephalic and atraumatic.  Cardiovascular: Normal rate and intact distal pulses.  Exam reveals no gallop and no friction rub.   No murmur  heard. Pulmonary/Chest: Effort normal. No respiratory distress. He has no wheezes. He has no rales. He exhibits no tenderness.  Decreased breath sounds bilaterally.   Abdominal: Soft. Bowel sounds are normal. He exhibits no distension. There is no tenderness.  Musculoskeletal: He exhibits no edema.  Neurological: He is alert and oriented to person, place, and time.  Skin: Skin is warm and dry. He is not diaphoretic.  Nursing note and vitals reviewed.   ED Course  Procedures (including critical care time) Labs Review Labs Reviewed  BASIC METABOLIC PANEL - Abnormal; Notable for the following:    Sodium 128 (*)    Chloride 79 (*)    CO2 40 (*)    Glucose, Bld 161 (*)    All other components within normal limits  CBC - Abnormal; Notable for the following:    WBC 10.9 (*)    RBC 3.74 (*)    Hemoglobin 9.9 (*)    HCT 33.3 (*)    MCHC 29.7 (*)    Platelets 132 (*)    All other components within normal limits  URINALYSIS, ROUTINE W REFLEX MICROSCOPIC (NOT AT [facility]) - Abnormal; Notable for the following:    Hgb urine dipstick SMALL (*)    Protein, ur >300 (*)    All other components within normal limits  URINE MICROSCOPIC-ADD ON - Abnormal; Notable for the following:    Squamous Epithelial / LPF 0-5 (*)    Bacteria, UA FEW (*)    All other components within normal limits  URINE CULTURE  PROTIME-INR  APTT  SODIUM, URINE, RANDOM  OSMOLALITY  OSMOLALITY, URINE  HEMOGLOBIN A1C  URINALYSIS, ROUTINE W REFLEX MICROSCOPIC (NOT AT [facility])  BLOOD GAS, ARTERIAL  TROPONIN I  TROPONIN I  I-STAT TROPOININ, ED  I-STAT TROPOININ, ED    Imaging Review Dg Chest 2 View  10/26/2015  CLINICAL DATA:  Chest pain and short of breath EXAM: CHEST  2 VIEW COMPARISON:  09/22/2015 FINDINGS: Left pneumonectomy. Extensive pleural calcification on the left is unchanged. Pulmonary vascular congestion is present in  the right lung suggesting mild fluid overload. No edema or effusion. Negative for pneumonia.  IMPRESSION: Left pneumonectomy. Mild pulmonary vascular congestion is present in the right lung without edema. Electronically Signed   By: Franchot Gallo M.D.   On: 10/26/2015 07:34   I have personally reviewed and evaluated these images and lab results as part of my medical decision-making.   EKG Interpretation   Date/Time:  Thursday October 26 2015 06:15:01 EDT Ventricular Rate:  77 PR Interval:    QRS Duration: 92 QT Interval:  376 QTC Calculation: 426 R Axis:   74 Text Interpretation:  Sinus rhythm Minimal ST elevation, inferior leads No  longer in atrial flutter ST elevation in inferior leads similar to prior  Confirmed by HORTON  MD, [place] ([id number]) on 10/26/2015 6:18:28 AM Also  confirmed by Dina Rich  MD, [place] ([id number]), editor [place]  CCT, [place]  ([id number])  on 10/26/2015 7:45:08 AM Also confirmed by Lacinda Axon  MD, [place] ([id number])   on 10/26/2015 9:04:28 AM      MDM   Final diagnoses:  Lower extremity weakness   IVON OELKERS presents to ED for extremity weakness x 4-5 days and chest pain which began this morning. 2 nitro given by EMS prior to arrival. Hx of CAD s/p CABG and aflutter of eliquis.   BMP with na 128, cl 79, co2 40. CBC with stable anemia. Trop negative x2  EKG reviewed with attending. Hx of aflutter which is not present on today's EKG.  CXR reviewed and shows signs of mild fluid overload on right.   Consults: Neurology who recommends MR brain and c-spine. If negative, outpatient neurology follow up.  Hospitalist who will admit patient for further eval/mgt.   Patient seen by and discussed with Dr. Dina Rich who agrees with treatment plan.   [facility] Margaretmary Prisk, PA-C 10/26/15 1202  Merryl Hacker, MD 10/31/15 [phone]

## 2015-10-26 NOTE — Progress Notes (Signed)
SLP Cancellation Note  Patient Details Name: Calvin [name] MRN: [id number] DOB: [birth year]   Cancelled treatment:       Reason Eval/Treat Not Completed: Medical issues which prohibited therapy. Patient on Bipap.  [place], [place] [phone]    [name] Meryl 10/26/2015, 3:38 PM

## 2015-10-26 NOTE — Progress Notes (Signed)
Sent transport over to get pt and Rn stated that pt could not come over due to being on BIPAP

## 2015-10-26 NOTE — Progress Notes (Signed)
RT Note: PCO2 > 100 on Istat, ABG sent to resp lab at this time to re-run.  MD & RN both aware. RT placing pt on Bipap at this time per MD.

## 2015-10-27 ENCOUNTER — Observation Stay (HOSPITAL_COMMUNITY): Payer: Medicare Other

## 2015-10-27 ENCOUNTER — Observation Stay (HOSPITAL_BASED_OUTPATIENT_CLINIC_OR_DEPARTMENT_OTHER): Payer: Medicare Other

## 2015-10-27 ENCOUNTER — Encounter (HOSPITAL_COMMUNITY): Payer: Self-pay | Admitting: *Deleted

## 2015-10-27 DIAGNOSIS — I480 Paroxysmal atrial fibrillation: Secondary | ICD-10-CM | POA: Diagnosis not present

## 2015-10-27 DIAGNOSIS — D696 Thrombocytopenia, unspecified: Secondary | ICD-10-CM | POA: Diagnosis present

## 2015-10-27 DIAGNOSIS — Z91030 Bee allergy status: Secondary | ICD-10-CM | POA: Diagnosis not present

## 2015-10-27 DIAGNOSIS — J438 Other emphysema: Secondary | ICD-10-CM

## 2015-10-27 DIAGNOSIS — I2510 Atherosclerotic heart disease of native coronary artery without angina pectoris: Secondary | ICD-10-CM | POA: Diagnosis present

## 2015-10-27 DIAGNOSIS — R809 Proteinuria, unspecified: Secondary | ICD-10-CM | POA: Diagnosis present

## 2015-10-27 DIAGNOSIS — J9601 Acute respiratory failure with hypoxia: Secondary | ICD-10-CM | POA: Diagnosis not present

## 2015-10-27 DIAGNOSIS — K219 Gastro-esophageal reflux disease without esophagitis: Secondary | ICD-10-CM | POA: Diagnosis present

## 2015-10-27 DIAGNOSIS — Z9981 Dependence on supplemental oxygen: Secondary | ICD-10-CM | POA: Diagnosis not present

## 2015-10-27 DIAGNOSIS — Z87891 Personal history of nicotine dependence: Secondary | ICD-10-CM | POA: Diagnosis not present

## 2015-10-27 DIAGNOSIS — J441 Chronic obstructive pulmonary disease with (acute) exacerbation: Secondary | ICD-10-CM | POA: Diagnosis present

## 2015-10-27 DIAGNOSIS — I509 Heart failure, unspecified: Secondary | ICD-10-CM | POA: Diagnosis not present

## 2015-10-27 DIAGNOSIS — Z951 Presence of aortocoronary bypass graft: Secondary | ICD-10-CM | POA: Diagnosis not present

## 2015-10-27 DIAGNOSIS — R269 Unspecified abnormalities of gait and mobility: Secondary | ICD-10-CM | POA: Diagnosis present

## 2015-10-27 DIAGNOSIS — E222 Syndrome of inappropriate secretion of antidiuretic hormone: Secondary | ICD-10-CM | POA: Diagnosis present

## 2015-10-27 DIAGNOSIS — K5900 Constipation, unspecified: Secondary | ICD-10-CM | POA: Diagnosis present

## 2015-10-27 DIAGNOSIS — E781 Pure hyperglyceridemia: Secondary | ICD-10-CM | POA: Diagnosis present

## 2015-10-27 DIAGNOSIS — H9190 Unspecified hearing loss, unspecified ear: Secondary | ICD-10-CM | POA: Diagnosis present

## 2015-10-27 DIAGNOSIS — I4891 Unspecified atrial fibrillation: Secondary | ICD-10-CM | POA: Diagnosis present

## 2015-10-27 DIAGNOSIS — I10 Essential (primary) hypertension: Secondary | ICD-10-CM

## 2015-10-27 DIAGNOSIS — G9341 Metabolic encephalopathy: Secondary | ICD-10-CM

## 2015-10-27 DIAGNOSIS — I4892 Unspecified atrial flutter: Secondary | ICD-10-CM | POA: Diagnosis present

## 2015-10-27 DIAGNOSIS — E872 Acidosis: Secondary | ICD-10-CM | POA: Diagnosis present

## 2015-10-27 DIAGNOSIS — J9621 Acute and chronic respiratory failure with hypoxia: Secondary | ICD-10-CM | POA: Diagnosis present

## 2015-10-27 DIAGNOSIS — E871 Hypo-osmolality and hyponatremia: Secondary | ICD-10-CM | POA: Diagnosis not present

## 2015-10-27 DIAGNOSIS — D649 Anemia, unspecified: Secondary | ICD-10-CM | POA: Diagnosis present

## 2015-10-27 DIAGNOSIS — Z902 Acquired absence of lung [part of]: Secondary | ICD-10-CM | POA: Diagnosis not present

## 2015-10-27 DIAGNOSIS — E785 Hyperlipidemia, unspecified: Secondary | ICD-10-CM

## 2015-10-27 DIAGNOSIS — G9340 Encephalopathy, unspecified: Secondary | ICD-10-CM | POA: Diagnosis not present

## 2015-10-27 DIAGNOSIS — Z7984 Long term (current) use of oral hypoglycemic drugs: Secondary | ICD-10-CM | POA: Diagnosis not present

## 2015-10-27 DIAGNOSIS — J9602 Acute respiratory failure with hypercapnia: Secondary | ICD-10-CM | POA: Diagnosis not present

## 2015-10-27 DIAGNOSIS — Z888 Allergy status to other drugs, medicaments and biological substances status: Secondary | ICD-10-CM | POA: Diagnosis not present

## 2015-10-27 DIAGNOSIS — Z881 Allergy status to other antibiotic agents status: Secondary | ICD-10-CM | POA: Diagnosis not present

## 2015-10-27 DIAGNOSIS — Z85118 Personal history of other malignant neoplasm of bronchus and lung: Secondary | ICD-10-CM | POA: Diagnosis not present

## 2015-10-27 DIAGNOSIS — E119 Type 2 diabetes mellitus without complications: Secondary | ICD-10-CM | POA: Diagnosis present

## 2015-10-27 DIAGNOSIS — Z882 Allergy status to sulfonamides status: Secondary | ICD-10-CM | POA: Diagnosis not present

## 2015-10-27 DIAGNOSIS — R0789 Other chest pain: Secondary | ICD-10-CM | POA: Diagnosis present

## 2015-10-27 DIAGNOSIS — Z7901 Long term (current) use of anticoagulants: Secondary | ICD-10-CM | POA: Diagnosis not present

## 2015-10-27 DIAGNOSIS — I701 Atherosclerosis of renal artery: Secondary | ICD-10-CM | POA: Diagnosis present

## 2015-10-27 DIAGNOSIS — J9622 Acute and chronic respiratory failure with hypercapnia: Secondary | ICD-10-CM | POA: Diagnosis present

## 2015-10-27 LAB — URINE CULTURE: CULTURE: NO GROWTH

## 2015-10-27 LAB — COMPREHENSIVE METABOLIC PANEL
ALBUMIN: 2.9 g/dL — AB (ref 3.5–5.0)
ALT: 17 U/L (ref 17–63)
AST: 20 U/L (ref 15–41)
Alkaline Phosphatase: 61 U/L (ref 38–126)
Anion gap: 10 (ref 5–15)
BUN: 24 mg/dL — AB (ref 6–20)
CHLORIDE: 79 mmol/L — AB (ref 101–111)
CO2: 40 mmol/L — AB (ref 22–32)
Calcium: 9.2 mg/dL (ref 8.9–10.3)
Creatinine, Ser: 1.12 mg/dL (ref 0.61–1.24)
GFR calc Af Amer: >60 mL/min (ref >60)
GFR calc non Af Amer: >60 mL/min (ref >60)
GLUCOSE: 136 mg/dL — AB (ref 65–99)
POTASSIUM: 4 mmol/L (ref 3.5–5.1)
SODIUM: 129 mmol/L — AB (ref 135–145)
Total Bilirubin: 0.5 mg/dL (ref 0.3–1.2)
Total Protein: 5.3 g/dL — ABNORMAL LOW (ref 6.5–8.1)

## 2015-10-27 LAB — TROPONIN I

## 2015-10-27 LAB — POCT I-STAT 3, ART BLOOD GAS (G3+)
Acid-Base Excess: 26 mmol/L — ABNORMAL HIGH (ref 0.0–2.0)
BICARBONATE: 53.2 meq/L — AB (ref 20.0–24.0)
O2 Saturation: 92 %
PCO2 ART: 64.9 mmHg — AB (ref 35.0–45.0)
pH, Arterial: 7.521 — ABNORMAL HIGH (ref 7.350–7.450)
pO2, Arterial: 59 mmHg — ABNORMAL LOW (ref 80.0–100.0)

## 2015-10-27 LAB — HEMOGLOBIN A1C
Hgb A1c MFr Bld: 6.2 % — ABNORMAL HIGH (ref 4.8–5.6)
Mean Plasma Glucose: 131 mg/dL

## 2015-10-27 LAB — GLUCOSE, CAPILLARY
GLUCOSE-CAPILLARY: 182 mg/dL — AB (ref 65–99)
GLUCOSE-CAPILLARY: 154 mg/dL — AB (ref 65–99)
Glucose-Capillary: 138 mg/dL — ABNORMAL HIGH (ref 65–99)
Glucose-Capillary: 145 mg/dL — ABNORMAL HIGH (ref 65–99)
Glucose-Capillary: 165 mg/dL — ABNORMAL HIGH (ref 65–99)
Glucose-Capillary: 169 mg/dL — ABNORMAL HIGH (ref 65–99)

## 2015-10-27 LAB — CBC
HEMATOCRIT: 31.7 % — AB (ref 39.0–52.0)
Hemoglobin: 9.7 g/dL — ABNORMAL LOW (ref 13.0–17.0)
MCH: 26.9 pg (ref 26.0–34.0)
MCHC: 30.6 g/dL (ref 30.0–36.0)
MCV: 87.8 fL (ref 78.0–100.0)
Platelets: 113 10*3/uL — ABNORMAL LOW (ref 150–400)
RBC: 3.61 MIL/uL — ABNORMAL LOW (ref 4.22–5.81)
RDW: 14.6 % (ref 11.5–15.5)
WBC: 9.8 10*3/uL (ref 4.0–10.5)

## 2015-10-27 LAB — ECHOCARDIOGRAM COMPLETE
Height: 70 in
Weight: 3269.86 [oz_av]

## 2015-10-27 LAB — MAGNESIUM: Magnesium: 2.2 mg/dL (ref 1.7–2.4)

## 2015-10-27 MED ORDER — PERFLUTREN LIPID MICROSPHERE
INTRAVENOUS | Status: AC
  Filled 2015-10-27: qty 10

## 2015-10-27 MED ORDER — CETYLPYRIDINIUM CHLORIDE 0.05 % MT LIQD
7.0000 mL | Freq: Two times a day (BID) | OROMUCOSAL | Status: DC
  Administered 2015-10-27 – 2015-10-29 (×4): 7 mL via OROMUCOSAL

## 2015-10-27 MED ORDER — FUROSEMIDE 10 MG/ML IJ SOLN
40.0000 mg | Freq: Once | INTRAMUSCULAR | Status: AC
  Administered 2015-10-27: 40 mg via INTRAVENOUS
  Filled 2015-10-27: qty 4

## 2015-10-27 MED ORDER — APIXABAN 5 MG PO TABS
5.0000 mg | ORAL_TABLET | Freq: Two times a day (BID) | ORAL | Status: DC
  Administered 2015-10-27 – 2015-10-29 (×4): 5 mg via ORAL
  Filled 2015-10-27 (×4): qty 1

## 2015-10-27 MED ORDER — CARVEDILOL 3.125 MG PO TABS
3.1250 mg | ORAL_TABLET | Freq: Two times a day (BID) | ORAL | Status: DC
  Administered 2015-10-27 – 2015-10-28 (×3): 3.125 mg via ORAL
  Filled 2015-10-27 (×3): qty 1

## 2015-10-27 MED ORDER — CETYLPYRIDINIUM CHLORIDE 0.05 % MT LIQD
7.0000 mL | Freq: Two times a day (BID) | OROMUCOSAL | Status: DC
  Administered 2015-10-27 (×2): 7 mL via OROMUCOSAL

## 2015-10-27 MED ORDER — INSULIN ASPART 100 UNIT/ML ~~LOC~~ SOLN
0.0000 [IU] | Freq: Three times a day (TID) | SUBCUTANEOUS | Status: DC
  Administered 2015-10-28: 3 [IU] via SUBCUTANEOUS
  Administered 2015-10-28 (×2): 1 [IU] via SUBCUTANEOUS
  Administered 2015-10-29 (×2): 2 [IU] via SUBCUTANEOUS

## 2015-10-27 MED ORDER — PERFLUTREN LIPID MICROSPHERE
1.0000 mL | INTRAVENOUS | Status: AC | PRN
  Administered 2015-10-27: 2 mL via INTRAVENOUS
  Filled 2015-10-27: qty 10

## 2015-10-27 MED ORDER — DILTIAZEM HCL ER COATED BEADS 180 MG PO CP24
180.0000 mg | ORAL_CAPSULE | Freq: Every day | ORAL | Status: DC
  Administered 2015-10-27 – 2015-10-28 (×2): 180 mg via ORAL
  Filled 2015-10-27 (×2): qty 1

## 2015-10-27 MED ORDER — CHLORHEXIDINE GLUCONATE 0.12 % MT SOLN
15.0000 mL | Freq: Two times a day (BID) | OROMUCOSAL | Status: DC
  Administered 2015-10-27: 15 mL via OROMUCOSAL

## 2015-10-27 MED ORDER — METHYLPREDNISOLONE SODIUM SUCC 40 MG IJ SOLR
40.0000 mg | Freq: Four times a day (QID) | INTRAMUSCULAR | Status: DC
Start: 2015-10-27 — End: 2015-10-28
  Administered 2015-10-27 – 2015-10-28 (×4): 40 mg via INTRAVENOUS
  Filled 2015-10-27 (×5): qty 1

## 2015-10-27 NOTE — Evaluation (Signed)
Physical Therapy Evaluation Patient Details Name: Calvin [name] MRN: [id number] DOB: [birth year] Today's Date: 10/27/2015   History of Present Illness  82 y.o. male with a PMH as outlined below including hx lung CA s/p left pneumonectomy in 2000, and severe COPD on 2L chronic O2. He presented to [facility] ED 07/06 with AMS along with 2 days of headache and dizziness. He also had chest pain on morning of presentation. Per his wife, he was acting "loopy", unable to keep balance, had loss of coordination, which prompted her to seek medical evaluation in ED. Dx acute on chronic hypoxemic and hypercapnic respiratory failure, mild pulm edema, metabolic encephalopathy.  MRI brain revealed no acute intracranial while MRI cervical spine revealed cervical spndylosis with multilevel disc and facet degeneration and spurring.  Pt's PMH includes hearling loss, a-flutter, depression, on home O2, stroke, GI bleed.    Clinical Impression  Pt admitted with above diagnosis. Pt currently with functional limitations due to the deficits listed below (see PT Problem List). Mr. Eslinger presents with impaired coordination and balance.  He currently requires min guard assist while ambulating with RW for safety w/ his SpO2 dropping to 86% on RA.  He will have 24/7 assist/supervision available from his wife at d/c. Pt will benefit from skilled PT to increase their independence and safety with mobility to allow discharge to the venue listed below.      Follow Up Recommendations Home health PT;Supervision for mobility/OOB    Equipment Recommendations  None recommended by PT    Recommendations for Other Services OT consult     Precautions / Restrictions Precautions Precautions: Fall Precaution Comments: Monitor O2 Restrictions Weight Bearing Restrictions: No      Mobility  Bed Mobility Overal bed mobility: Needs Assistance Bed Mobility: Supine to Sit     Supine to sit: Min guard;HOB elevated     General bed mobility  comments: Pt moves quickly, using bed rail.  Transfers Overall transfer level: Needs assistance Equipment used: Rolling walker (2 wheeled) Transfers: Sit to/from Stand Sit to Stand: Min assist;Min guard         General transfer comment: Min assist to steady for sit>stand from bed, min guard for sit>stand from chair.  Cues for hand placement and to keep RW with him when turning to sit.  Ambulation/Gait Ambulation/Gait assistance: Min guard Ambulation Distance (Feet): 100 Feet Assistive device: Rolling walker (2 wheeled) Gait Pattern/deviations: Step-through pattern;Decreased stride length;Ataxic;Trunk flexed Gait velocity: decreased   General Gait Details: Ataxic but pt steady using RW, min guard assist for safety.  SpO2 down to 86% on 2L O2, all other VSS.   Stairs            Wheelchair Mobility    Modified Rankin (Stroke Patients Only)       Balance Overall balance assessment: Needs assistance Sitting-balance support: No upper extremity supported;Feet supported Sitting balance-Leahy Scale: Good     Standing balance support: Bilateral upper extremity supported;During functional activity Standing balance-Leahy Scale: Poor Standing balance comment: Relies on support from RW                              Pertinent Vitals/Pain Pain Assessment: No/denies pain    Home Living Family/patient expects to be discharged to:: Private residence Living Arrangements: Spouse/significant other Available Help at Discharge: Family;Available 24 hours/day Type of Home: House Home Access: Stairs to enter   [employer] of Steps: 1 Home Layout: One level Home  Equipment: Grab bars - tub/shower;Cane - single point;Walker - 2 wheels      Prior Function Level of Independence: Independent         Comments: Ambualting without AD.  Still driving. Wife does cooking and cleaning but pt does the yard work.  Pt was on 1.5 L O2 but recently bumped up to 2 L due to  onset of symptoms.     Hand Dominance        Extremity/Trunk Assessment   Upper Extremity Assessment: Defer to OT evaluation           Lower Extremity Assessment: RLE deficits/detail;LLE deficits/detail RLE Deficits / Details: strength WNL w/ MMT LLE Deficits / Details: strength WNL w/ MMT  Cervical / Trunk Assessment: Kyphotic  Communication   Communication: HOH;Other (comment) (has hearing aide but does not wear it)  Cognition Arousal/Alertness: Awake/alert Behavior During Therapy: WFL for tasks assessed/performed Overall Cognitive Status: Difficult to assess                      General Comments General comments (skin integrity, edema, etc.): Encouraged pt to continue to use RW and to work with therapy to eventually d/c RW, pt and wife verbalized understanding.    Exercises General Exercises - Lower Extremity Straight Leg Raises: AROM;Both;5 reps;Supine      Assessment/Plan    PT Assessment Patient needs continued PT services  PT Diagnosis Difficulty walking   PT Problem List Decreased activity tolerance;Decreased balance;Decreased coordination;Decreased knowledge of use of DME;Decreased safety awareness  PT Treatment Interventions DME instruction;Gait training;Stair training;Functional mobility training;Therapeutic exercise;Therapeutic activities;Balance training;Patient/family education   PT Goals (Current goals can be found in the Care Plan section) Acute Rehab PT Goals Patient Stated Goal: to return home PT Goal Formulation: With patient/family Time For Goal Achievement: 11/10/15 Potential to Achieve Goals: Good    Frequency Min 3X/week   Barriers to discharge        Co-evaluation               End of Session Equipment Utilized During Treatment: Gait belt;Oxygen Activity Tolerance: Patient limited by fatigue;Patient tolerated treatment well Patient left: in chair;with call bell/phone within reach;with chair alarm set Nurse  Communication: Mobility status;Other (comment) (SpO2)    Functional Assessment Tool Used: Clinical Judgement Functional Limitation: Mobility: Walking and moving around Mobility: Walking and Moving Around Current Status [phone]): At least 1 percent but less than 20 percent impaired, limited or restricted Mobility: Walking and Moving Around Goal Status [phone]): At least 1 percent but less than 20 percent impaired, limited or restricted    Time: 1323-1359 PT Time Calculation (min) (ACUTE ONLY): 36 min   Charges:   PT Evaluation $PT Eval Low Complexity: 1 Procedure PT Treatments $Gait Training: 8-22 mins   PT G Codes:   PT G-Codes **NOT FOR INPATIENT CLASS** Functional Assessment Tool Used: Clinical Judgement Functional Limitation: Mobility: Walking and moving around Mobility: Walking and Moving Around Current Status (C1660): At least 1 percent but less than 20 percent impaired, limited or restricted Mobility: Walking and Moving Around Goal Status [phone]): At least 1 percent but less than 20 percent impaired, limited or restricted   Collie Siad PT, DPT  Pager: [phone] Phone: [phone] 10/27/2015, 3:54 PM

## 2015-10-27 NOTE — Progress Notes (Signed)
RBV: ABG critical values given to M. Mel Almond, RN at 11:48. RT will continue to monitor.

## 2015-10-27 NOTE — Progress Notes (Signed)
  Echocardiogram 2D Echocardiogram has been performed with definity.  Aggie Cosier 10/27/2015, 9:10 AM

## 2015-10-27 NOTE — Progress Notes (Signed)
PROGRESS NOTE  Calvin [name]:[id number] DOB: 02/17/1933 DOA: 10/26/2015 PCP:  [name], MD  Brief History:  82 y/o male with history of lung cancer s/p Left pneumonectomy in 2000, COPD, CAD status post CABG 10 years ago, atrial fibrillation, chronic respiratory failure on 1.5 L, but it is mellitus, hypertension, hyperlipidemia presented with 3-4 day history of increasing confusion and generalized weakness. The patient was encephalopathic and history was obtained from the patient's wife. Apparently, the patient had difficulty with ambulation and coordination. At baseline, the patient is able to perform all activities of daily living without difficulty, but his wife has noted decreased oral intake for 3-4 days prior to admission. In addition, the patient's oxygen saturations at home had dropped into the 70s and the patient's wife increased his baseline oxygen 2 L. In emergency department ABG revealed 7.26/119/113/48. The patient was placed on BiPAP, and pulmonary medicine was consulted to assist.  Assessment/Plan: Acute on chronic respiratory failure with hypoxia and hypercarbia -Due to COPD exacerbation -Continue intravenous steroids--> began weaning in the next 24 hours -Patient has been transitioned off BiPAP -Wean oxygen to keep saturation 90-92 percent -Continue bronchodilators -Appreciate pulmonary follow-up--> concern for pulmonary edema-->ordered additional dose of furosemide for this morning (patient has received 3 doses IV) -Discontinue antibiotics  Acute metabolic encephalopathy -Secondary to respiratory failure with hypoxia and hypercarbia -10/27/2015--much improved -Serum B12 357 -Ammonia 25 -TSH is 0.800 -Urinalysis negative polyuria -CT brain negative  Hyponatremia -Urine and serum osmolarity suggested degree of SIADH--likely due to acute medical illness -will not continue IV lasix  Atypical chest pain/CAD -Troponins negative 3 -Personally reviewed  EKG--nonspecific ST changes -No chest pain presently -restart coreg  Thrombocytopenia -B12 357  -INR 1.11  -PTT 28  -Check fibrinogen   Hypertension  -Restart carvedilol at lower dose  Atrial flutter  -Presently in sinus rhythm with PACs   Renal artery stenosis  -This has likely been a chronic issue  -Follow up with [facility] in outpt setting -this is contributing to his labile HTN  Hyperlipidemia  -Continue statin   Gait instability and headache -MRI brain once the patient is more stable  Diabetes mellitus type 2 -10/26/2015 A1c 6.2 -Discontinue metformin for now -ISS   Disposition Plan:   Home in 2 days  Family Communication:  No Family at bedside   Consultants:  PCCM  Code Status:  FULL  DVT Prophylaxis:  lovenox full dose   Procedures: As Listed in Progress Note Above  Antibiotics: None    Subjective: Patient denies fevers, chills, headache, chest pain, dyspnea, nausea, vomiting, diarrhea, abdominal pain, dysuria, hematuria   Objective: Filed Vitals:   10/27/15 0151 10/27/15 0331 10/27/15 0412 10/27/15 0601  BP: 168/88  139/77 139/77  Pulse: 82  61 73  Temp:   97.7 F (36.5 C)   TempSrc:   Oral   Resp: '26  12 17  '$ Height:  '5\' 10"'$  (1.778 m)    Weight:  92.7 kg (204 lb 5.9 oz)    SpO2: 98%  99% 96%    Intake/Output Summary (Last 24 hours) at 10/27/15 0801 Last data filed at 10/27/15 0656  Gross per 24 hour  Intake    200 ml  Output    625 ml  Net   -425 ml   Weight change:  Exam:   General:  Pt is alert, follows commands appropriately, not in acute distress  HEENT: No icterus, No thrush, No neck mass, Vera/AT  Cardiovascular: RRR, S1/S2, no rubs, no gallops  Respiratory: Diminished breath sounds on the left. Right basilar rales without wheezing   Abdomen: Soft/+BS, non tender, non distended, no guarding  Extremities: No edema, No lymphangitis, No petechiae, No rashes, no synovitis   Data Reviewed: I have personally reviewed  following labs and imaging studies Basic Metabolic Panel:  Recent Labs Lab 10/26/15 0616 10/26/15 1514 10/26/15 1636 10/27/15 0247  NA 128* 130*  --  129*  K 4.1 3.9  --  4.0  CL 79* 80*  --  79*  CO2 40* 44*  --  40*  GLUCOSE 161* 130*  --  136*  BUN 16 17  --  24*  CREATININE 0.79 0.97  --  1.12  CALCIUM 9.5 9.4  --  9.2  MG  --   --  2.2 2.2  PHOS  --   --  2.5  --    Liver Function Tests:  Recent Labs Lab 10/27/15 0247  AST 20  ALT 17  ALKPHOS 61  BILITOT 0.5  PROT 5.3*  ALBUMIN 2.9*   No results for input(s): LIPASE, AMYLASE in the last 168 hours.  Recent Labs Lab 10/26/15 1636  AMMONIA 25   Coagulation Profile:  Recent Labs Lab 10/26/15 0616  INR 1.11   CBC:  Recent Labs Lab 10/26/15 0616 10/27/15 0247  WBC 10.9* 9.8  HGB 9.9* 9.7*  HCT 33.3* 31.7*  MCV 89.0 87.8  PLT 132* 113*   Cardiac Enzymes:  Recent Labs Lab 10/26/15 1440 10/26/15 1953 10/27/15 0247  TROPONINI <0.03 <0.03 <0.03   BNP: Invalid input(s): POCBNP CBG:  Recent Labs Lab 10/26/15 1239 10/26/15 1612 10/26/15 2011 10/27/15 0010 10/27/15 0400  GLUCAP 163* 120* 189* 138* 145*   HbA1C:  Recent Labs  10/26/15 1141  HGBA1C 6.2*   Urine analysis:    Component Value Date/Time   COLORURINE YELLOW 10/26/2015 1031   APPEARANCEUR CLEAR 10/26/2015 1031   LABSPEC 1.024 10/26/2015 1031   PHURINE 6.5 10/26/2015 1031   GLUCOSEU NEGATIVE 10/26/2015 1031   HGBUR SMALL* 10/26/2015 1031   [place] 10/26/2015 1031   KETONESUR NEGATIVE 10/26/2015 1031   PROTEINUR >300* 10/26/2015 1031   UROBILINOGEN 0.2 06/02/2007 2336   NITRITE NEGATIVE 10/26/2015 1031   LEUKOCYTESUR NEGATIVE 10/26/2015 1031   Sepsis Labs: '@LABRCNTIP'$ (procalcitonin:4,lacticidven:4) ) Recent Results (from the past 240 hour(s))  MRSA PCR Screening     Status: None   Collection Time: 10/26/15  8:38 PM  Result Value Ref Range Status   MRSA by PCR NEGATIVE NEGATIVE Final    Comment:         The GeneXpert MRSA Assay (FDA approved for NASAL specimens only), is one component of a comprehensive MRSA colonization surveillance program. It is not intended to diagnose MRSA infection nor to guide or monitor treatment for MRSA infections.      Scheduled Meds: . antiseptic oral rinse  7 mL Mouth Rinse q12n4p  . chlorhexidine  15 mL Mouth Rinse BID  . cholecalciferol  2,000 Units Oral BID  . enoxaparin (LOVENOX) injection  1 mg/kg Subcutaneous Q12H  . insulin aspart  0-9 Units Subcutaneous Q4H  . ipratropium-albuterol  3 mL Nebulization TID  . methylPREDNISolone (SOLU-MEDROL) injection  40 mg Intravenous Q6H  . potassium chloride  20 mEq Oral BID   Continuous Infusions:   Procedures/Studies: Dg Chest 2 View  10/26/2015  CLINICAL DATA:  Chest pain and short of breath EXAM: CHEST  2 VIEW COMPARISON:  09/22/2015 FINDINGS: Left  pneumonectomy. Extensive pleural calcification on the left is unchanged. Pulmonary vascular congestion is present in the right lung suggesting mild fluid overload. No edema or effusion. Negative for pneumonia. IMPRESSION: Left pneumonectomy. Mild pulmonary vascular congestion is present in the right lung without edema. Electronically Signed   By: Franchot Gallo M.D.   On: 10/26/2015 07:34   Ct Head Wo Contrast  10/26/2015  CLINICAL DATA:  82 year old male inpatient with reported history of encephalopathy, headache and dizziness. EXAM: CT HEAD WITHOUT CONTRAST TECHNIQUE: Contiguous axial images were obtained from the base of the skull through the vertex without intravenous contrast. COMPARISON:  09/02/2013 head CT. FINDINGS: No evidence of parenchymal hemorrhage or extra-axial fluid collection. No mass lesion, mass effect, or midline shift. No CT evidence of acute infarction. Intracranial atherosclerosis. Nonspecific mild subcortical and periventricular white matter hypodensity, most in keeping with chronic small vessel ischemic change. Cerebral volume is age  appropriate. No ventriculomegaly. The visualized paranasal sinuses are essentially clear. The mastoid air cells are unopacified. No evidence of calvarial fracture. IMPRESSION: 1.  No evidence of acute intracranial abnormality. 2. Mild chronic small vessel ischemia. Electronically Signed   By: Ilona Sorrel M.D.   On: 10/26/2015 17:20   Ct Angio Chest Pe W Or Wo Contrast  10/26/2015  ADDENDUM REPORT: 10/26/2015 17:58 ADDENDUM: Large right thyroid nodule which has been present since 2011. This could be more definitively characterized with ultrasound. Electronically Signed   By: Markus Daft M.D.   On: 10/26/2015 17:58  10/26/2015  CLINICAL DATA:  82 year old with shortness of breath. History of lung cancer and left pneumonectomy. EXAM: CT ANGIOGRAPHY CHEST WITH CONTRAST TECHNIQUE: Multidetector CT imaging of the chest was performed using the standard protocol during bolus administration of intravenous contrast. Multiplanar CT image reconstructions and MIPs were obtained to evaluate the vascular anatomy. CONTRAST:  100 mL Isovue 370 COMPARISON:  Chest CTA 01/19/2010 FINDINGS: Cardiovascular: No evidence for a pulmonary embolism. Prior ligation of the left main pulmonary artery due to left pneumonectomy. Coronary arteries and thoracic aorta are heavily calcified. No evidence to suggest an aortic dissection. Extensive plaque involving the descending thoracic aorta. At least mild stenosis at the origin of celiac trunk. SMA is patent. High-grade stenosis of the left renal artery with near occlusion. There is extensive plaque involving the proximal right renal artery with at least moderate stenosis. Mediastinum/Nodes: Post left pneumonectomy changes with extensive left pleural calcifications and slightly dense fluid in the left hemithorax. These findings are similar to the prior examination. 2.8 cm low-density nodule in the right thyroid lobe is similar to the exam in 2011. No suspicious mediastinal or hilar lymphadenopathy.  Mild fullness in the subcarinal region is thought to be associated with the esophagus. No suspicious axillary lymphadenopathy. Lungs/Pleura: Emphysematous changes in the right upper lung. There is stable right apical scarring. Dependent densities in the right lower lobe could represent atelectasis. No significant airspace disease or consolidation. Upper Abdomen: Mild to moderate distention of the visualized gallbladder. Diffuse thickening of the left adrenal gland is similar to the previous examination and probably represents hyperplasia. Similarly, there is probably hyperplasia in the right adrenal gland. Left kidney is atrophic. No acute abnormality in the upper abdomen. Musculoskeletal: Prior median sternotomy. No acute bone abnormality. Review of the MIP images confirms the above findings. IMPRESSION: Negative for pulmonary embolism. Stable postsurgical changes associated with the left pneumonectomy. Dependent densities in the right lung probably represent atelectasis. No other significant right lung disease. Extensive atherosclerotic disease involving the aorta  and visceral arteries. Bilateral renal artery stenosis. Near occlusion of the left renal artery with left renal atrophy. Electronically Signed: By: Markus Daft M.D. On: 10/26/2015 17:34   Dg Chest Port 1 View  10/27/2015  CLINICAL DATA:  Respiratory failure. EXAM: PORTABLE CHEST 1 VIEW COMPARISON:  10/26/2015.  CT 10/26/2015. FINDINGS: Left pneumonectomy. Dense pleural calcifications again noted. Mild right base subsegmental atelectasis. Prior median sternotomy. No pneumothorax. IMPRESSION: 1. Stable changes of left pneumonectomy with dense pleural calcification. 2. Mild right base subsegmental atelectasis. 3. Prior CABG.  Chest is stable from prior exams. Electronically Signed   By: Marcello Moores  Register   On: 10/27/2015 06:59   Dg Abd Portable 1v  10/26/2015  CLINICAL DATA:  Constipation EXAM: PORTABLE ABDOMEN - 1 VIEW COMPARISON:  07/04/2005 CT  abdomen/pelvis FINDINGS: No dilated small bowel loops. Mild-to-moderate colonic stool volume. No evidence of pneumatosis, pneumoperitoneum or pathologic soft tissue calcifications. Moderate lumbar spondylosis. IMPRESSION: Nonobstructive bowel gas pattern. Mild-to-moderate colorectal stool volume. Electronically Signed   By: Ilona Sorrel M.D.   On: 10/26/2015 13:25    Janaiya Beauchesne, DO  [facility] Pager [phone]  If 7PM-7AM, please contact night-coverage [email or web address] Password [email or web address] 10/27/2015, 8:01 AM

## 2015-10-27 NOTE — Progress Notes (Signed)
Patient placed back on BIPAP 16/6  rr 10, FIO2 35%. Patient tolerating well at this time.

## 2015-10-27 NOTE — Progress Notes (Signed)
PT Cancellation Note  Patient Details Name: QUATAVIOUS ROSSA MRN: [id number] DOB: 1932-09-27   Cancelled Treatment:    Reason Eval/Treat Not Completed: Patient at procedure or test/unavailable (Pt to have MRI).  Will attempt to return to see pt later today, schedule permitting.   Collie Siad PT, DPT  Pager: [phone] Phone: [phone] 10/27/2015, 9:02 AM

## 2015-10-27 NOTE — Progress Notes (Signed)
Spoke w pt and wife. Pt is hard of hearing. phy there rec hhpt. Gave wife International aid/development worker. Wife not sure pt will accept hhc. She will talk w pt and let cm know if they want hhc.

## 2015-10-27 NOTE — Progress Notes (Signed)
Pt with 8 beat run of vtach on the monitor. Pt asleep and asymptomatic. Vitals obtained and as follows.  Filed Vitals:   10/26/15 2350 10/26/15 2357 10/27/15 0105 10/27/15 0110  BP: 160/79  105/53 168/88  Pulse: 64  64 74  Temp:      TempSrc:      Resp: '10  11 18  '$ SpO2: 99% 98% 100% 100%   PA on call notified and orders given for additional lab work in the am. Will continue to monitor.

## 2015-10-27 NOTE — Progress Notes (Signed)
CRITICAL VALUE ALERT  Critical value received:  Blood gas ph 7.52, pCo2 64.9, PO2 59, bicarb 53.2   Date of notification:  10/27/15  Time of notification:  4628  Critical value read back:Yes.    Nurse who received alert:  Verlin Grills  MD notified (1st page):  Tat  Time of first page:  1158

## 2015-10-27 NOTE — Progress Notes (Signed)
OT Cancellation Note  Patient Details Name: Calvin [name] MRN: [id number] DOB: 1933/02/19   Cancelled Treatment:    Reason Eval/Treat Not Completed: Patient at procedure or test/ unavailable. Pt just finishing up with test in room and getting ready to go down for MRI per nursing. Will check back at later time for eval.  Almon Register [phone] 10/27/2015, 9:00 AM

## 2015-10-27 NOTE — Care Management Obs Status (Signed)
[place] NOTIFICATION   Patient Details  Name: Calvin [name] MRN: [id number] Date of Birth: [birth year]   Medicare Observation Status Notification Given:  Yes    Lacretia Leigh, RN 10/27/2015, 2:13 PM

## 2015-10-27 NOTE — Evaluation (Signed)
Clinical/Bedside Swallow Evaluation Patient Details  Name: Calvin [name] MRN: [id number] Date of Birth: September 21, 1932  Today's Date: 10/27/2015 Time: SLP Start Time (ACUTE ONLY): 2542 SLP Stop Time (ACUTE ONLY): 1219 SLP Time Calculation (min) (ACUTE ONLY): 14 min  Past Medical History:  Past Medical History  Diagnosis Date  . Essential hypertension   . Hypertriglyceridemia   . CAD (coronary artery disease)     a. 06/2005 s/p CABG x1 (VG->RCA) @ time of myxoma resection.  . 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  . Carotid artery disease ([place])     a. 06/2005 s/p R CEA;  b. 10/2013 Carotid U/S: patent RICA, LICA 70-62%;  c. 06/7626 U/S: RICA 31-51%, LICA 76-16%.  . Hearing loss   . COPD (chronic obstructive pulmonary disease) ([place])   . Atrial flutter with rapid ventricular response ([place])     a. 2007 s/p DCCV following CV surgery;  b. 10/2014->CHA2DS2VASc = 5-->Xarelto.  . Squamous cell lung cancer ([place])     a. s/p L pneumonectomy in 2000.  . Type II diabetes mellitus ([place])   . Scarlet fever     a. ~ 1942.  Marland Kitchen History of pneumonia   . GERD (gastroesophageal reflux disease)   . Daily headache   . Arthritis   . Depression   . On home oxygen therapy     "2L; 24/7" (11/03/2014)  . Stroke ([place])   . GI bleed 07/2015   Past Surgical History:  Past Surgical History  Procedure Laterality Date  . Coronary artery bypass graft  07/12/2005    Dr Servando Snare, CABG x 1 with reverse saphenous vein graft to distal rt coronary artery and excision of left atrial myxoma [Other]  . Pneumonectomy Left 05/03/1998    Dr Servando Snare  . Combined mediastinoscopy and bronchoscopy  05/01/1998    Dr Servando Snare  . Carotid endarterectomy Right 2007  . Cardiac catheterization  06/2005; 01/2010    Archie Endo 10/06/2010; Archie Endo 02/02/2010  . Transesophageal echocardiogram  06/2005    Archie Endo 09/04/2010  . Cardioversion  07/2005    DCC/notes 09/04/2010  . Renal angiogram Bilateral 12/2005    Archie Endo 09/04/2010  .  Esophagogastroduodenoscopy (egd) with propofol N/A 07/26/2015    Procedure: ESOPHAGOGASTRODUODENOSCOPY (EGD) WITH PROPOFOL;  Surgeon: Wonda Horner, MD;  Location: [facility] ENDOSCOPY;  Service: Endoscopy;  Laterality: N/A;  . Colonoscopy N/A 07/27/2015    Procedure: COLONOSCOPY;  Surgeon: Wonda Horner, MD;  Location: [facility] ENDOSCOPY;  Service: Endoscopy;  Laterality: N/A;   HPI:  82 y.o. male with a PMH as outlined below including hx lung CA s/p left pneumonectomy in 2000, and severe COPD on 2L chronic O2. He presented to [facility] ED 07/06 with AMS along with 2 days of headache and dizziness. He also had chest pain on morning of presentation. Per his wife, he was acting "loopy", unable to keep balance, had loss of coordination, which prompted her to seek medical evaluation in ED.  Dx acute on chronic hypoxemic and hypercapnic respiratory failure, mild pulm edema, metabolic encephalopathy.    Assessment / Plan / Recommendation Clinical Impression    Pt presents with functional oropharyngeal swallow marked by adequate reciprocity of swallowing and breathing.  RR is well under threshold for concerns re: swallowing/breathing coordination.  Pt is exhaling post-swallow; there are no overt s/s of aspiration.  Mastication is effective despite absence of teeth, and pt passed 3-oz water test with no deficits.  Continue current diet - no SLP f/u  recommended.      Aspiration Risk  No limitations    Diet Recommendation   regular, thin liquids  Medication Administration: Whole meds with liquid    Other  Recommendations     Follow up Recommendations  none  Frequency and Duration            Prognosis        Swallow Study   General Date of Onset: 10/26/15 HPI: 82 y.o. male with a PMH as outlined below including hx lung CA s/p left pneumonectomy in 2000, and severe COPD on 2L chronic O2. He presented to [facility] ED 07/06 with AMS along with 2 days of headache and dizziness. He also had chest pain on morning of  presentation. Per his wife, he was acting "loopy", unable to keep balance, had loss of coordination, which prompted her to seek medical evaluation in ED.  Dx acute on chronic hypoxemic and hypercapnic respiratory failure, mild pulm edema, metabolic encephalopathy.  Type of Study: Bedside Swallow Evaluation Previous Swallow Assessment: no Diet Prior to this Study: NPO Temperature Spikes Noted: No Respiratory Status: Nasal cannula History of Recent Intubation: No Behavior/Cognition: Alert;Cooperative Oral Cavity Assessment: Within Functional Limits Oral Care Completed by SLP: Yes Oral Cavity - Dentition: Edentulous Vision: Functional for self-feeding Self-Feeding Abilities: Able to feed self Patient Positioning: Upright in bed Baseline Vocal Quality: Normal Volitional Cough: Strong Volitional Swallow: Able to elicit    Oral/Motor/Sensory Function Overall Oral Motor/Sensory Function: Within functional limits   Ice Chips Ice chips: Within functional limits   Thin Liquid Thin Liquid: Within functional limits Presentation: Self Fed;Straw    Nectar Thick Nectar Thick Liquid: Not tested   Honey Thick Honey Thick Liquid: Not tested   Puree Puree: Within functional limits Presentation: Self Fed;Spoon   Solid   GO   Solid: Within functional limits Presentation: Self Fed    Functional Assessment Tool Used: clinical judgment Functional Limitations: Swallowing Swallow Current Status (B5670): 0 percent impaired, limited or restricted Swallow Goal Status (L4103): 0 percent impaired, limited or restricted Swallow Discharge Status [phone]): 0 percent impaired, limited or restricted   [name] 10/27/2015,12:20 PM   Estill Bamberg L. Tivis Ringer, [place] CCC/SLP Pager [phone]

## 2015-10-27 NOTE — Consult Note (Addendum)
Name: Calvin [name] MRN: [id number] DOB: Jun 07, 1932    ADMISSION DATE:  10/26/2015 CONSULTATION DATE:  10/26/15  REFERRING MD :  Cruzita Lederer  CHIEF COMPLAINT:  AMS   HISTORY OF PRESENT ILLNESS:  Pt is encephelopathic; therefore, this HPI is obtained from chart review.  Calvin [name] is a 82 y.o. male with a PMH as outlined below including hx lung CA s/p left pneumonectomy in 2000, and severe COPD on 2L chronic O2.  He presented to [facility] ED 07/06 with AMS along with 2 days of headache and dizziness.  He also had chest pain on morning of presentation.  Per his wife, he was acting "loopy", unable to keep balance, had loss of coordination, which prompted her to seek medical evaluation in ED.  In ED, ABG revealed respiratory acidosis with acute on chronic hypoxemic and hypercapnic respiratory failure.  He was started on BiPAP and repeat ABG demonstrated persistent hypercapnia but normal pH.  Due to his ongoing confusion and needs for BiPAP, PCCM was asked to see pt.  No hx fevers/chills/sweats, cough, exposure to known sick contacts, recent travel.   SUBJECTIVE:  This am off BIPAP, awake, no distress, improved status  VITAL SIGNS: Temp:  [97.7 F (36.5 C)-98.3 F (36.8 C)] 97.7 F (36.5 C) (07/07 0412) Pulse Rate:  [25-105] 73 (07/07 0601) Resp:  [10-29] 17 (07/07 0601) BP: (85-188)/(35-129) 139/77 mmHg (07/07 0601) SpO2:  [89 %-100 %] 96 % (07/07 0601) FiO2 (%):  [28 %-30 %] 28 % (07/06 2026) Weight:  [92.7 kg (204 lb 5.9 oz)] 92.7 kg (204 lb 5.9 oz) (07/07 0331)  PHYSICAL EXAMINATION: General: Elderly male, resting in bed, in NAD. Profound hearing loss. Neuro: Awake, confusion better O x 2, hearing poor HEENT: [place]/AT. PERRL, sclerae anicteric. Cardiovascular: RRR, no M/R/G.  Lungs: Respirations no distress at all.  Absent BS on left (hx pneumonectomy). Abdomen: BS x 4, soft, NT/ND.  Musculoskeletal: No gross deformities, no edema.  Skin: Intact, warm, no rashes.     Recent Labs Lab  10/26/15 0616 10/26/15 1514 10/27/15 0247  NA 128* 130* 129*  K 4.1 3.9 4.0  CL 79* 80* 79*  CO2 40* 44* 40*  BUN 16 17 24*  CREATININE 0.79 0.97 1.12  GLUCOSE 161* 130* 136*    Recent Labs Lab 10/26/15 0616 10/27/15 0247  HGB 9.9* 9.7*  HCT 33.3* 31.7*  WBC 10.9* 9.8  PLT 132* 113*   Dg Chest 2 View  10/26/2015  CLINICAL DATA:  Chest pain and short of breath EXAM: CHEST  2 VIEW COMPARISON:  09/22/2015 FINDINGS: Left pneumonectomy. Extensive pleural calcification on the left is unchanged. Pulmonary vascular congestion is present in the right lung suggesting mild fluid overload. No edema or effusion. Negative for pneumonia. IMPRESSION: Left pneumonectomy. Mild pulmonary vascular congestion is present in the right lung without edema. Electronically Signed   By: Franchot Gallo M.D.   On: 10/26/2015 07:34   Ct Head Wo Contrast  10/26/2015  CLINICAL DATA:  82 year old male inpatient with reported history of encephalopathy, headache and dizziness. EXAM: CT HEAD WITHOUT CONTRAST TECHNIQUE: Contiguous axial images were obtained from the base of the skull through the vertex without intravenous contrast. COMPARISON:  09/02/2013 head CT. FINDINGS: No evidence of parenchymal hemorrhage or extra-axial fluid collection. No mass lesion, mass effect, or midline shift. No CT evidence of acute infarction. Intracranial atherosclerosis. Nonspecific mild subcortical and periventricular white matter hypodensity, most in keeping with chronic small vessel ischemic change. Cerebral volume is age appropriate.  No ventriculomegaly. The visualized paranasal sinuses are essentially clear. The mastoid air cells are unopacified. No evidence of calvarial fracture. IMPRESSION: 1.  No evidence of acute intracranial abnormality. 2. Mild chronic small vessel ischemia. Electronically Signed   By: Ilona Sorrel M.D.   On: 10/26/2015 17:20   Ct Angio Chest Pe W Or Wo Contrast  10/26/2015  ADDENDUM REPORT: 10/26/2015 17:58  ADDENDUM: Large right thyroid nodule which has been present since 2011. This could be more definitively characterized with ultrasound. Electronically Signed   By: Markus Daft M.D.   On: 10/26/2015 17:58  10/26/2015  CLINICAL DATA:  82 year old with shortness of breath. History of lung cancer and left pneumonectomy. EXAM: CT ANGIOGRAPHY CHEST WITH CONTRAST TECHNIQUE: Multidetector CT imaging of the chest was performed using the standard protocol during bolus administration of intravenous contrast. Multiplanar CT image reconstructions and MIPs were obtained to evaluate the vascular anatomy. CONTRAST:  100 mL Isovue 370 COMPARISON:  Chest CTA 01/19/2010 FINDINGS: Cardiovascular: No evidence for a pulmonary embolism. Prior ligation of the left main pulmonary artery due to left pneumonectomy. Coronary arteries and thoracic aorta are heavily calcified. No evidence to suggest an aortic dissection. Extensive plaque involving the descending thoracic aorta. At least mild stenosis at the origin of celiac trunk. SMA is patent. High-grade stenosis of the left renal artery with near occlusion. There is extensive plaque involving the proximal right renal artery with at least moderate stenosis. Mediastinum/Nodes: Post left pneumonectomy changes with extensive left pleural calcifications and slightly dense fluid in the left hemithorax. These findings are similar to the prior examination. 2.8 cm low-density nodule in the right thyroid lobe is similar to the exam in 2011. No suspicious mediastinal or hilar lymphadenopathy. Mild fullness in the subcarinal region is thought to be associated with the esophagus. No suspicious axillary lymphadenopathy. Lungs/Pleura: Emphysematous changes in the right upper lung. There is stable right apical scarring. Dependent densities in the right lower lobe could represent atelectasis. No significant airspace disease or consolidation. Upper Abdomen: Mild to moderate distention of the visualized  gallbladder. Diffuse thickening of the left adrenal gland is similar to the previous examination and probably represents hyperplasia. Similarly, there is probably hyperplasia in the right adrenal gland. Left kidney is atrophic. No acute abnormality in the upper abdomen. Musculoskeletal: Prior median sternotomy. No acute bone abnormality. Review of the MIP images confirms the above findings. IMPRESSION: Negative for pulmonary embolism. Stable postsurgical changes associated with the left pneumonectomy. Dependent densities in the right lung probably represent atelectasis. No other significant right lung disease. Extensive atherosclerotic disease involving the aorta and visceral arteries. Bilateral renal artery stenosis. Near occlusion of the left renal artery with left renal atrophy. Electronically Signed: By: Markus Daft M.D. On: 10/26/2015 17:34   Dg Abd Portable 1v  10/26/2015  CLINICAL DATA:  Constipation EXAM: PORTABLE ABDOMEN - 1 VIEW COMPARISON:  07/04/2005 CT abdomen/pelvis FINDINGS: No dilated small bowel loops. Mild-to-moderate colonic stool volume. No evidence of pneumatosis, pneumoperitoneum or pathologic soft tissue calcifications. Moderate lumbar spondylosis. IMPRESSION: Nonobstructive bowel gas pattern. Mild-to-moderate colorectal stool volume. Electronically Signed   By: Ilona Sorrel M.D.   On: 10/26/2015 13:25    STUDIES:  CXR 07/06 > left pneumonectomy, mild vascular congestion on right.  SIGNIFICANT EVENTS  07/06 > admitted.  ASSESSMENT / PLAN:  Acute on chronic hypoxemic and hypercapnic respiratory failure. COPD with possible mild exacerbation. Mild pulmonary edema also seen on CT and fluid fissure on pcxr Hx lung CA s/p left pneumonectomy in  2000. Plan: Interrupt scheduled BIPAP Repeat abg at 4 hours off pcxr reviewed and CT, maintain lasix to neg balance as renal fxn tolerates- may need redcution No infection noted, dc all abx Continued steroids, BDdrs, likley reduce steroids  in am   Acute encephalopathy - likely exacerbated by hypercapnia (worse compared to baseline). Plan: abg follow  up Assess CT head - neg Assess TSH, folate, B12, ammonia -per primary Mri per primary when resp alsows safe  Rest per primary team.  Will sign off call if needed   Lavon Paganini. Titus Mould, MD, [place] Pgr: [facility] 10/27/2015 6:51 AM

## 2015-10-28 DIAGNOSIS — D696 Thrombocytopenia, unspecified: Secondary | ICD-10-CM

## 2015-10-28 LAB — CBC
HCT: 34.5 % — ABNORMAL LOW (ref 39.0–52.0)
HEMOGLOBIN: 10.6 g/dL — AB (ref 13.0–17.0)
MCH: 27 pg (ref 26.0–34.0)
MCHC: 30.7 g/dL (ref 30.0–36.0)
MCV: 88 fL (ref 78.0–100.0)
Platelets: 155 10*3/uL (ref 150–400)
RBC: 3.92 MIL/uL — AB (ref 4.22–5.81)
RDW: 15.3 % (ref 11.5–15.5)
WBC: 17.9 10*3/uL — ABNORMAL HIGH (ref 4.0–10.5)

## 2015-10-28 LAB — GLUCOSE, CAPILLARY
Glucose-Capillary: 143 mg/dL — ABNORMAL HIGH (ref 65–99)
Glucose-Capillary: 224 mg/dL — ABNORMAL HIGH (ref 65–99)
Glucose-Capillary: 129 mg/dL — ABNORMAL HIGH (ref 65–99)

## 2015-10-28 LAB — BASIC METABOLIC PANEL
Anion gap: 9 (ref 5–15)
BUN: 34 mg/dL — AB (ref 6–20)
CHLORIDE: 81 mmol/L — AB (ref 101–111)
CO2: 43 mmol/L — ABNORMAL HIGH (ref 22–32)
Calcium: 9.2 mg/dL (ref 8.9–10.3)
Creatinine, Ser: 1.21 mg/dL (ref 0.61–1.24)
GFR calc Af Amer: >60 mL/min (ref >60)
GFR calc non Af Amer: 55 mL/min — ABNORMAL LOW (ref >60)
GLUCOSE: 161 mg/dL — AB (ref 65–99)
POTASSIUM: 4 mmol/L (ref 3.5–5.1)
Sodium: 133 mmol/L — ABNORMAL LOW (ref 135–145)

## 2015-10-28 LAB — FIBRINOGEN: FIBRINOGEN: 363 mg/dL (ref 204–475)

## 2015-10-28 LAB — VITAMIN B12: VITAMIN B 12: 297 pg/mL (ref 180–914)

## 2015-10-28 LAB — BRAIN NATRIURETIC PEPTIDE: B Natriuretic Peptide: 164.7 pg/mL — ABNORMAL HIGH (ref 0.0–100.0)

## 2015-10-28 MED ORDER — CARVEDILOL 6.25 MG PO TABS
6.2500 mg | ORAL_TABLET | Freq: Once | ORAL | Status: AC
  Administered 2015-10-28: 6.25 mg via ORAL
  Filled 2015-10-28: qty 1

## 2015-10-28 MED ORDER — PREDNISONE 10 MG PO TABS: 50.0000 mg | ORAL_TABLET | Freq: Every day | ORAL | Status: DC

## 2015-10-28 MED ORDER — METHYLPREDNISOLONE SODIUM SUCC 40 MG IJ SOLR
40.0000 mg | Freq: Two times a day (BID) | INTRAMUSCULAR | Status: DC
  Administered 2015-10-28 – 2015-10-29 (×2): 40 mg via INTRAVENOUS
  Filled 2015-10-28 (×2): qty 1

## 2015-10-28 MED ORDER — CARVEDILOL 12.5 MG PO TABS
12.5000 mg | ORAL_TABLET | Freq: Two times a day (BID) | ORAL | Status: DC
  Administered 2015-10-28 – 2015-10-29 (×2): 12.5 mg via ORAL
  Filled 2015-10-28 (×2): qty 1

## 2015-10-28 MED ORDER — PHENOL 1.4 % MT LIQD
1.0000 | OROMUCOSAL | Status: DC | PRN
  Administered 2015-10-28 (×2): 1 via OROMUCOSAL
  Filled 2015-10-28: qty 177

## 2015-10-28 NOTE — Discharge Summary (Signed)
Physician Discharge Summary  Calvin [name]:[id number] DOB: 03/29/1933 DOA: 10/26/2015  PCP:  [name], MD  Admit date: 10/26/2015 Discharge date: 10/29/15  Admitted From: HOME Disposition:  HOME  Recommendations for Outpatient Follow-up:  1. Follow up with PCP in 1-2 weeks 2. Please obtain BMP one week  Home Health: YES Equipment/Devices: 1.5 to 2L oxygen, PT/ OT  Discharge Condition:stable CODE STATUS: FULL Diet recommendation: Heart Healthy / Carb Modified  Brief/Interim Summary: 82 y/o male with history of lung cancer s/p Left pneumonectomy in 2000, COPD, CAD status post CABG 10 years ago, atrial fibrillation, chronic respiratory failure on 1.5 L, but it is mellitus, hypertension, hyperlipidemia presented with 3-4 day history of increasing confusion and generalized weakness. The patient was encephalopathic and history was obtained from the patient's wife. Apparently, the patient had difficulty with ambulation and coordination. At baseline, the patient is able to perform all activities of daily living without difficulty, but his wife has noted decreased oral intake for 3-4 days prior to admission. In addition, the patient's oxygen saturations at home had dropped into the 70s and the patient's wife increased his baseline oxygen 2 L. In emergency department ABG revealed 7.26/119/113/48. The patient was placed on BiPAP, and pulmonary medicine was consulted to assist.  The patient was started on IV lasix and solumedrol with good clinical effect.  Only 3 doses of lasix were given.  Solumedrol was weaned to prednisone and the patient will go home with prednisone taper.  Discharge Diagnoses:  Acute on chronic respiratory failure with hypoxia and hypercarbia -Due to COPD exacerbation -Weaned intravenous steroids q 6hr  to q 12 hours--home with prednisone taper -Patient has been transitioned off BiPAP -Wean oxygen to keep saturation 90-92 percent -Continue bronchodilators -Appreciate  pulmonary follow-up--> concern for pulmonary edema-->ordered additional dose of furosemide for this morning (patient has received 3 doses IV) -Discontinued antibiotics--remains stable clinically  Acute metabolic encephalopathy -Secondary to respiratory failure with hypoxia and hypercarbia -10/27/2015--much improved -back to baseline on day of d/c -Serum B12 357 -Ammonia 25 -TSH is 0.800 -Urinalysis negative pyuria -CT brain negative  Hyponatremia -Urine and serum osmolarity suggested degree of SIADH--likely due to acute medical illness -will not continue IV lasix--remained stable -resolved  Atypical chest pain/CAD -Troponins negative 3 -Personally reviewed EKG--nonspecific ST changes -No chest pain presently -Increase coreg dose -10/27/15 echo--EF 60-65, grade 1 DD  Thrombocytopenia -B12--357  -INR 1.11  -PTT 28  -Check fibrinogen--363  Hypertension  -increase carvedilol back to home dose -restarted cardizem CD 180 mg -pt instructed to cut his previous losartan 100/25 pill in half (50/12.'5mg'$ )  Atrial flutter  -Presently in sinus rhythm with PACs  -continue apixaban -continue coreg and cardizem  Renal artery stenosis  -This has likely been a chronic issue  -Follow up with [facility] in outpt setting -this is contributing to his labile HTN  Hyperlipidemia  -Continue statin   Gait instability and headache -MRI brain--neg for acute findings  Diabetes mellitus type 2 -10/26/2015 A1c 6.2 -Discontinue metformin for now--restart after d/c -ISS -controlled   Discharge Instructions      Discharge Instructions    Diet - low sodium heart healthy    Complete by:  As directed      Increase activity slowly    Complete by:  As directed             Medication List    STOP taking these medications        pantoprazole 40 MG tablet  Commonly known as:  PROTONIX      TAKE these medications        acetaminophen 325 MG tablet  Commonly known as:  TYLENOL    Take 325 mg by mouth every 6 (six) hours as needed for mild pain, moderate pain or headache. For pain     albuterol 108 (90 Base) MCG/ACT inhaler  Commonly known as:  PROVENTIL HFA;VENTOLIN HFA  Inhale 2 puffs into the lungs every 6 (six) hours as needed for wheezing.     apixaban 5 MG Tabs tablet  Commonly known as:  ELIQUIS  Take 1 tablet (5 mg total) by mouth 2 (two) times daily.     carvedilol 25 MG tablet  Commonly known as:  COREG  Take 1 tablet (25 mg total) by mouth 2 (two) times daily.     cetirizine 10 MG tablet  Commonly known as:  ZYRTEC  Take 10 mg by mouth daily as needed for allergies or rhinitis. For allergies     cholecalciferol 1000 units tablet  Commonly known as:  VITAMIN D  Take 2,000 Units by mouth 2 (two) times daily.     diltiazem 180 MG 24 hr capsule  Commonly known as:  CARDIZEM CD  Take 1 capsule (180 mg total) by mouth at bedtime.     docusate sodium 100 MG capsule  Commonly known as:  COLACE  Take 2 capsules (200 mg total) by mouth daily.     EPIPEN 2-PAK 0.3 mg/0.3 mL Soaj injection  Generic drug:  EPINEPHrine  Inject 0.3 mg into the skin See admin instructions. Take as needed for severe allergic reaction     Fish Oil 1200 MG Caps  Take 1 capsule by mouth 2 (two) times daily.     ipratropium-albuterol 0.5-2.5 (3) MG/3ML Soln  Commonly known as:  DUONEB  Take 3 mLs by nebulization 3 (three) times daily.     losartan-hydrochlorothiazide 100-25 MG tablet  Commonly known as:  HYZAAR  Take 0.5 tablets by mouth daily.     magnesium oxide 400 MG tablet  Commonly known as:  MAG-OX  Take 400 mg by mouth at bedtime.     metFORMIN 500 MG tablet  Commonly known as:  GLUCOPHAGE  Take 500 mg by mouth daily with breakfast.     multivitamins ther. w/minerals Tabs tablet  Take 1 tablet by mouth daily.     NON FORMULARY  Place 2 L into the nose continuous.     polyethylene glycol packet  Commonly known as:  MIRALAX / GLYCOLAX  Take 17 g by  mouth 2 (two) times daily.     predniSONE 10 MG tablet  Commonly known as:  DELTASONE  Take 5 tablets (50 mg total) by mouth daily with breakfast. And decrease by 1 tablet daily  Start taking on:  10/30/2015     triamcinolone cream 0.1 %  Commonly known as:  KENALOG  Apply 1 application topically 2 (two) times daily as needed (itchin).       Follow-up Information    Follow up with  [name], MD.   Specialty:  [facility]   Contact information:   [place] [address] [phone]      Allergies  Allergen Reactions  . Bee Venom Anaphylaxis  . Sulfa Antibiotics   . Amoxicillin Rash  . Statins Other (See Comments)    Muscle aches    Consultations:  PCCM   Procedures/Studies: Dg Chest 2 View  10/26/2015  CLINICAL DATA:  Chest  pain and short of breath EXAM: CHEST  2 VIEW COMPARISON:  09/22/2015 FINDINGS: Left pneumonectomy. Extensive pleural calcification on the left is unchanged. Pulmonary vascular congestion is present in the right lung suggesting mild fluid overload. No edema or effusion. Negative for pneumonia. IMPRESSION: Left pneumonectomy. Mild pulmonary vascular congestion is present in the right lung without edema. Electronically Signed   By: Franchot Gallo M.D.   On: 10/26/2015 07:34   Ct Head Wo Contrast  10/26/2015  CLINICAL DATA:  82 year old male inpatient with reported history of encephalopathy, headache and dizziness. EXAM: CT HEAD WITHOUT CONTRAST TECHNIQUE: Contiguous axial images were obtained from the base of the skull through the vertex without intravenous contrast. COMPARISON:  09/02/2013 head CT. FINDINGS: No evidence of parenchymal hemorrhage or extra-axial fluid collection. No mass lesion, mass effect, or midline shift. No CT evidence of acute infarction. Intracranial atherosclerosis. Nonspecific mild subcortical and periventricular white matter hypodensity, most in keeping with chronic small vessel ischemic change. Cerebral volume  is age appropriate. No ventriculomegaly. The visualized paranasal sinuses are essentially clear. The mastoid air cells are unopacified. No evidence of calvarial fracture. IMPRESSION: 1.  No evidence of acute intracranial abnormality. 2. Mild chronic small vessel ischemia. Electronically Signed   By: Ilona Sorrel M.D.   On: 10/26/2015 17:20   Ct Angio Chest Pe W Or Wo Contrast  10/26/2015  ADDENDUM REPORT: 10/26/2015 17:58 ADDENDUM: Large right thyroid nodule which has been present since 2011. This could be more definitively characterized with ultrasound. Electronically Signed   By: Markus Daft M.D.   On: 10/26/2015 17:58  10/26/2015  CLINICAL DATA:  82 year old with shortness of breath. History of lung cancer and left pneumonectomy. EXAM: CT ANGIOGRAPHY CHEST WITH CONTRAST TECHNIQUE: Multidetector CT imaging of the chest was performed using the standard protocol during bolus administration of intravenous contrast. Multiplanar CT image reconstructions and MIPs were obtained to evaluate the vascular anatomy. CONTRAST:  100 mL Isovue 370 COMPARISON:  Chest CTA 01/19/2010 FINDINGS: Cardiovascular: No evidence for a pulmonary embolism. Prior ligation of the left main pulmonary artery due to left pneumonectomy. Coronary arteries and thoracic aorta are heavily calcified. No evidence to suggest an aortic dissection. Extensive plaque involving the descending thoracic aorta. At least mild stenosis at the origin of celiac trunk. SMA is patent. High-grade stenosis of the left renal artery with near occlusion. There is extensive plaque involving the proximal right renal artery with at least moderate stenosis. Mediastinum/Nodes: Post left pneumonectomy changes with extensive left pleural calcifications and slightly dense fluid in the left hemithorax. These findings are similar to the prior examination. 2.8 cm low-density nodule in the right thyroid lobe is similar to the exam in 2011. No suspicious mediastinal or hilar  lymphadenopathy. Mild fullness in the subcarinal region is thought to be associated with the esophagus. No suspicious axillary lymphadenopathy. Lungs/Pleura: Emphysematous changes in the right upper lung. There is stable right apical scarring. Dependent densities in the right lower lobe could represent atelectasis. No significant airspace disease or consolidation. Upper Abdomen: Mild to moderate distention of the visualized gallbladder. Diffuse thickening of the left adrenal gland is similar to the previous examination and probably represents hyperplasia. Similarly, there is probably hyperplasia in the right adrenal gland. Left kidney is atrophic. No acute abnormality in the upper abdomen. Musculoskeletal: Prior median sternotomy. No acute bone abnormality. Review of the MIP images confirms the above findings. IMPRESSION: Negative for pulmonary embolism. Stable postsurgical changes associated with the left pneumonectomy. Dependent densities in the right lung  probably represent atelectasis. No other significant right lung disease. Extensive atherosclerotic disease involving the aorta and visceral arteries. Bilateral renal artery stenosis. Near occlusion of the left renal artery with left renal atrophy. Electronically Signed: By: Markus Daft M.D. On: 10/26/2015 17:34   Mr Brain Wo Contrast  10/27/2015  CLINICAL DATA:  Altered mental status and headache. Extremity weakness. EXAM: MRI HEAD WITHOUT CONTRAST MRI CERVICAL SPINE WITHOUT CONTRAST TECHNIQUE: Multiplanar, multiecho pulse sequences of the brain and surrounding structures, and cervical spine, to include the craniocervical junction and cervicothoracic junction, were obtained without intravenous contrast. COMPARISON:  CT 10/26/2015.  MRI 05/18/2011 FINDINGS: MRI HEAD FINDINGS Image quality degraded by mild motion. Generalized atrophy.  Negative for hydrocephalus Negative for acute infarct. Mild chronic microvascular ischemic changes in the white matter. Small  chronic infarct right caudate and left thalamus. Brainstem and cerebellum intact. Negative for intracranial hemorrhage. No mass or edema. No shift of the midline structures Paranasal sinuses clear. No orbital lesion. Pituitary not enlarged. MRI CERVICAL SPINE FINDINGS Alignment: Normal alignment. Reversal of the cervical lordosis with mild kyphosis. Vertebrae: Negative for fracture or mass.  Normal bone marrow. Cord: No cord lesion identified. Motion degraded images with limited evaluation of the cord. Posterior Fossa, vertebral arteries, paraspinal tissues: Negative Disc levels: C1-2: Degenerative changes C1-C2 without significant stenosis. C2-3: Mild disc and facet degeneration. No significant spinal or foraminal stenosis C3-4: Moderate disc degeneration and spondylosis with diffuse uncinate spurring. Bilateral facet hypertrophy. Mild spinal stenosis and mild foraminal stenosis bilaterally C4-5: Disc degeneration and mild to moderate spondylosis left greater than right. Bilateral facet hypertrophy. Moderate left foraminal encroachment and mild right foraminal encroachment. Mild spinal stenosis. C5-6: Disc degeneration and moderate spondylosis. Mild spinal stenosis and moderate foraminal encroachment bilaterally. Bilateral facet degeneration C6-7: Disc degeneration and spondylosis with diffuse uncinate spurring and bilateral facet degeneration. Mild spinal stenosis and moderate foraminal encroachment bilaterally C7-T1:  Mild facet degeneration. IMPRESSION: Atrophy and mild chronic ischemic change. No acute intracranial abnormality Cervical spondylosis. Multilevel disc and facet degeneration and spurring causing spinal and foraminal encroachment at multiple levels as described above. Electronically Signed   By: Franchot Gallo M.D.   On: 10/27/2015 11:56   Mr Cervical Spine Wo Contrast  10/27/2015  CLINICAL DATA:  Altered mental status and headache. Extremity weakness. EXAM: MRI HEAD WITHOUT CONTRAST MRI CERVICAL  SPINE WITHOUT CONTRAST TECHNIQUE: Multiplanar, multiecho pulse sequences of the brain and surrounding structures, and cervical spine, to include the craniocervical junction and cervicothoracic junction, were obtained without intravenous contrast. COMPARISON:  CT 10/26/2015.  MRI 05/18/2011 FINDINGS: MRI HEAD FINDINGS Image quality degraded by mild motion. Generalized atrophy.  Negative for hydrocephalus Negative for acute infarct. Mild chronic microvascular ischemic changes in the white matter. 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 Brainstem and cerebellum intact. Negative for intracranial hemorrhage. No mass or edema. No shift of the midline structures Paranasal sinuses clear. No orbital lesion. Pituitary not enlarged. MRI CERVICAL SPINE FINDINGS Alignment: Normal alignment. Reversal of the cervical lordosis with mild kyphosis. Vertebrae: Negative for fracture or mass.  Normal bone marrow. Cord: No cord lesion identified. Motion degraded images with limited evaluation of the cord. Posterior Fossa, vertebral arteries, paraspinal tissues: Negative Disc levels: C1-2: Degenerative changes C1-C2 without significant stenosis. C2-3: Mild disc and facet degeneration. No significant spinal or foraminal stenosis C3-4: Moderate disc degeneration and spondylosis with diffuse uncinate spurring. Bilateral facet hypertrophy. Mild spinal stenosis and mild foraminal stenosis bilaterally C4-5: Disc degeneration and mild to moderate spondylosis left  greater than right. Bilateral facet hypertrophy. Moderate left foraminal encroachment and mild right foraminal encroachment. Mild spinal stenosis. C5-6: Disc degeneration and moderate spondylosis. Mild spinal stenosis and moderate foraminal encroachment bilaterally. Bilateral facet degeneration C6-7: Disc degeneration and spondylosis with diffuse uncinate spurring and bilateral facet degeneration. Mild spinal stenosis and moderate foraminal encroachment bilaterally C7-T1:   Mild facet degeneration. IMPRESSION: Atrophy and mild chronic ischemic change. No acute intracranial abnormality Cervical spondylosis. Multilevel disc and facet degeneration and spurring causing spinal and foraminal encroachment at multiple levels as described above. Electronically Signed   By: Franchot Gallo M.D.   On: 10/27/2015 11:56   Dg Chest Port 1 View  10/27/2015  CLINICAL DATA:  Respiratory failure. EXAM: PORTABLE CHEST 1 VIEW COMPARISON:  10/26/2015.  CT 10/26/2015. FINDINGS: Left pneumonectomy. Dense pleural calcifications again noted. Mild right base subsegmental atelectasis. Prior median sternotomy. No pneumothorax. IMPRESSION: 1. Stable changes of left pneumonectomy with dense pleural calcification. 2. Mild right base subsegmental atelectasis. 3. Prior CABG.  Chest is stable from prior exams. Electronically Signed   By: Marcello Moores  Register   On: 10/27/2015 06:59   Dg Abd Portable 1v  10/26/2015  CLINICAL DATA:  Constipation EXAM: PORTABLE ABDOMEN - 1 VIEW COMPARISON:  07/04/2005 CT abdomen/pelvis FINDINGS: No dilated small bowel loops. Mild-to-moderate colonic stool volume. No evidence of pneumatosis, pneumoperitoneum or pathologic soft tissue calcifications. Moderate lumbar spondylosis. IMPRESSION: Nonobstructive bowel gas pattern. Mild-to-moderate colorectal stool volume. Electronically Signed   By: Ilona Sorrel M.D.   On: 10/26/2015 13:25        Discharge Exam: Filed Vitals:   10/28/15 1249 10/28/15 2031  BP: 133/84 160/56  Pulse: 72 70  Temp: 98.2 F (36.8 C) 98.5 F (36.9 C)  Resp: 18    Filed Vitals:   10/28/15 1249 10/28/15 2031 10/28/15 2054 10/29/15 0940  BP: 133/84 160/56    Pulse: 72 70    Temp: 98.2 F (36.8 C) 98.5 F (36.9 C)    TempSrc: Oral Oral    Resp: 18     Height: '5\' 10"'$  (1.778 m)     Weight: 91.536 kg (201 lb 12.8 oz)     SpO2: 97% 98% 98% 96%    General: Pt is alert, awake, not in acute distress Cardiovascular: RRR, S1/S2 +, no rubs, no  gallops Respiratory: diminished BS on L; R basilar rales, good air movement.  No wheeze Abdominal: Soft, NT, ND, bowel sounds + Extremities: no edema, no cyanosis   The results of significant diagnostics from this hospitalization (including imaging, microbiology, ancillary and laboratory) are listed below for reference.    Significant Diagnostic Studies: Dg Chest 2 View  10/26/2015  CLINICAL DATA:  Chest pain and short of breath EXAM: CHEST  2 VIEW COMPARISON:  09/22/2015 FINDINGS: Left pneumonectomy. Extensive pleural calcification on the left is unchanged. Pulmonary vascular congestion is present in the right lung suggesting mild fluid overload. No edema or effusion. Negative for pneumonia. IMPRESSION: Left pneumonectomy. Mild pulmonary vascular congestion is present in the right lung without edema. Electronically Signed   By: Franchot Gallo M.D.   On: 10/26/2015 07:34   Ct Head Wo Contrast  10/26/2015  CLINICAL DATA:  82 year old male inpatient with reported history of encephalopathy, headache and dizziness. EXAM: CT HEAD WITHOUT CONTRAST TECHNIQUE: Contiguous axial images were obtained from the base of the skull through the vertex without intravenous contrast. COMPARISON:  09/02/2013 head CT. FINDINGS: No evidence of parenchymal hemorrhage or extra-axial fluid collection. No mass lesion, mass effect,  or midline shift. No CT evidence of acute infarction. Intracranial atherosclerosis. Nonspecific mild subcortical and periventricular white matter hypodensity, most in keeping with chronic small vessel ischemic change. Cerebral volume is age appropriate. No ventriculomegaly. The visualized paranasal sinuses are essentially clear. The mastoid air cells are unopacified. No evidence of calvarial fracture. IMPRESSION: 1.  No evidence of acute intracranial abnormality. 2. Mild chronic small vessel ischemia. Electronically Signed   By: Ilona Sorrel M.D.   On: 10/26/2015 17:20   Ct Angio Chest Pe W Or Wo  Contrast  10/26/2015  ADDENDUM REPORT: 10/26/2015 17:58 ADDENDUM: Large right thyroid nodule which has been present since 2011. This could be more definitively characterized with ultrasound. Electronically Signed   By: Markus Daft M.D.   On: 10/26/2015 17:58  10/26/2015  CLINICAL DATA:  82 year old with shortness of breath. History of lung cancer and left pneumonectomy. EXAM: CT ANGIOGRAPHY CHEST WITH CONTRAST TECHNIQUE: Multidetector CT imaging of the chest was performed using the standard protocol during bolus administration of intravenous contrast. Multiplanar CT image reconstructions and MIPs were obtained to evaluate the vascular anatomy. CONTRAST:  100 mL Isovue 370 COMPARISON:  Chest CTA 01/19/2010 FINDINGS: Cardiovascular: No evidence for a pulmonary embolism. Prior ligation of the left main pulmonary artery due to left pneumonectomy. Coronary arteries and thoracic aorta are heavily calcified. No evidence to suggest an aortic dissection. Extensive plaque involving the descending thoracic aorta. At least mild stenosis at the origin of celiac trunk. SMA is patent. High-grade stenosis of the left renal artery with near occlusion. There is extensive plaque involving the proximal right renal artery with at least moderate stenosis. Mediastinum/Nodes: Post left pneumonectomy changes with extensive left pleural calcifications and slightly dense fluid in the left hemithorax. These findings are similar to the prior examination. 2.8 cm low-density nodule in the right thyroid lobe is similar to the exam in 2011. No suspicious mediastinal or hilar lymphadenopathy. Mild fullness in the subcarinal region is thought to be associated with the esophagus. No suspicious axillary lymphadenopathy. Lungs/Pleura: Emphysematous changes in the right upper lung. There is stable right apical scarring. Dependent densities in the right lower lobe could represent atelectasis. No significant airspace disease or consolidation. Upper Abdomen:  Mild to moderate distention of the visualized gallbladder. Diffuse thickening of the left adrenal gland is similar to the previous examination and probably represents hyperplasia. Similarly, there is probably hyperplasia in the right adrenal gland. Left kidney is atrophic. No acute abnormality in the upper abdomen. Musculoskeletal: Prior median sternotomy. No acute bone abnormality. Review of the MIP images confirms the above findings. IMPRESSION: Negative for pulmonary embolism. Stable postsurgical changes associated with the left pneumonectomy. Dependent densities in the right lung probably represent atelectasis. No other significant right lung disease. Extensive atherosclerotic disease involving the aorta and visceral arteries. Bilateral renal artery stenosis. Near occlusion of the left renal artery with left renal atrophy. Electronically Signed: By: Markus Daft M.D. On: 10/26/2015 17:34   Mr Brain Wo Contrast  10/27/2015  CLINICAL DATA:  Altered mental status and headache. Extremity weakness. EXAM: MRI HEAD WITHOUT CONTRAST MRI CERVICAL SPINE WITHOUT CONTRAST TECHNIQUE: Multiplanar, multiecho pulse sequences of the brain and surrounding structures, and cervical spine, to include the craniocervical junction and cervicothoracic junction, were obtained without intravenous contrast. COMPARISON:  CT 10/26/2015.  MRI 05/18/2011 FINDINGS: MRI HEAD FINDINGS Image quality degraded by mild motion. Generalized atrophy.  Negative for hydrocephalus Negative for acute infarct. Mild chronic microvascular ischemic changes in the white matter. Small chronic infarct right  caudate and left thalamus. Brainstem and cerebellum intact. Negative for intracranial hemorrhage. No mass or edema. No shift of the midline structures Paranasal sinuses clear. No orbital lesion. Pituitary not enlarged. MRI CERVICAL SPINE FINDINGS Alignment: Normal alignment. Reversal of the cervical lordosis with mild kyphosis. Vertebrae: Negative for fracture  or mass.  Normal bone marrow. Cord: No cord lesion identified. Motion degraded images with limited evaluation of the cord. Posterior Fossa, vertebral arteries, paraspinal tissues: Negative Disc levels: C1-2: Degenerative changes C1-C2 without significant stenosis. C2-3: Mild disc and facet degeneration. No significant spinal or foraminal stenosis C3-4: Moderate disc degeneration and spondylosis with diffuse uncinate spurring. Bilateral facet hypertrophy. Mild spinal stenosis and mild foraminal stenosis bilaterally C4-5: Disc degeneration and mild to moderate spondylosis left greater than right. Bilateral facet hypertrophy. Moderate left foraminal encroachment and mild right foraminal encroachment. Mild spinal stenosis. C5-6: Disc degeneration and moderate spondylosis. Mild spinal stenosis and moderate foraminal encroachment bilaterally. Bilateral facet degeneration C6-7: Disc degeneration and spondylosis with diffuse uncinate spurring and bilateral facet degeneration. Mild spinal stenosis and moderate foraminal encroachment bilaterally C7-T1:  Mild facet degeneration. IMPRESSION: Atrophy and mild chronic ischemic change. No acute intracranial abnormality Cervical spondylosis. Multilevel disc and facet degeneration and spurring causing spinal and foraminal encroachment at multiple levels as described above. Electronically Signed   By: Franchot Gallo M.D.   On: 10/27/2015 11:56   Mr Cervical Spine Wo Contrast  10/27/2015  CLINICAL DATA:  Altered mental status and headache. Extremity weakness. EXAM: MRI HEAD WITHOUT CONTRAST MRI CERVICAL SPINE WITHOUT CONTRAST TECHNIQUE: Multiplanar, multiecho pulse sequences of the brain and surrounding structures, and cervical spine, to include the craniocervical junction and cervicothoracic junction, were obtained without intravenous contrast. COMPARISON:  CT 10/26/2015.  MRI 05/18/2011 FINDINGS: MRI HEAD FINDINGS Image quality degraded by mild motion. Generalized atrophy.   Negative for hydrocephalus Negative for acute infarct. Mild chronic microvascular ischemic changes in the white matter. 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 Brainstem and cerebellum intact. Negative for intracranial hemorrhage. No mass or edema. No shift of the midline structures Paranasal sinuses clear. No orbital lesion. Pituitary not enlarged. MRI CERVICAL SPINE FINDINGS Alignment: Normal alignment. Reversal of the cervical lordosis with mild kyphosis. Vertebrae: Negative for fracture or mass.  Normal bone marrow. Cord: No cord lesion identified. Motion degraded images with limited evaluation of the cord. Posterior Fossa, vertebral arteries, paraspinal tissues: Negative Disc levels: C1-2: Degenerative changes C1-C2 without significant stenosis. C2-3: Mild disc and facet degeneration. No significant spinal or foraminal stenosis C3-4: Moderate disc degeneration and spondylosis with diffuse uncinate spurring. Bilateral facet hypertrophy. Mild spinal stenosis and mild foraminal stenosis bilaterally C4-5: Disc degeneration and mild to moderate spondylosis left greater than right. Bilateral facet hypertrophy. Moderate left foraminal encroachment and mild right foraminal encroachment. Mild spinal stenosis. C5-6: Disc degeneration and moderate spondylosis. Mild spinal stenosis and moderate foraminal encroachment bilaterally. Bilateral facet degeneration C6-7: Disc degeneration and spondylosis with diffuse uncinate spurring and bilateral facet degeneration. Mild spinal stenosis and moderate foraminal encroachment bilaterally C7-T1:  Mild facet degeneration. IMPRESSION: Atrophy and mild chronic ischemic change. No acute intracranial abnormality Cervical spondylosis. Multilevel disc and facet degeneration and spurring causing spinal and foraminal encroachment at multiple levels as described above. Electronically Signed   By: Franchot Gallo M.D.   On: 10/27/2015 11:56   Dg Chest Port 1 View  10/27/2015   CLINICAL DATA:  Respiratory failure. EXAM: PORTABLE CHEST 1 VIEW COMPARISON:  10/26/2015.  CT 10/26/2015. FINDINGS: Left pneumonectomy. Dense pleural calcifications  again noted. Mild right base subsegmental atelectasis. Prior median sternotomy. No pneumothorax. IMPRESSION: 1. Stable changes of left pneumonectomy with dense pleural calcification. 2. Mild right base subsegmental atelectasis. 3. Prior CABG.  Chest is stable from prior exams. Electronically Signed   By: Marcello Moores  Register   On: 10/27/2015 06:59   Dg Abd Portable 1v  10/26/2015  CLINICAL DATA:  Constipation EXAM: PORTABLE ABDOMEN - 1 VIEW COMPARISON:  07/04/2005 CT abdomen/pelvis FINDINGS: No dilated small bowel loops. Mild-to-moderate colonic stool volume. No evidence of pneumatosis, pneumoperitoneum or pathologic soft tissue calcifications. Moderate lumbar spondylosis. IMPRESSION: Nonobstructive bowel gas pattern. Mild-to-moderate colorectal stool volume. Electronically Signed   By: Ilona Sorrel M.D.   On: 10/26/2015 13:25     Microbiology: Recent Results (from the past 240 hour(s))  Urine culture     Status: None   Collection Time: 10/26/15 10:31 AM  Result Value Ref Range Status   Specimen Description URINE, CLEAN CATCH  Final   Special Requests NONE  Final   Culture NO GROWTH  Final   Report Status 10/27/2015 FINAL  Final  MRSA PCR Screening     Status: None   Collection Time: 10/26/15  8:38 PM  Result Value Ref Range Status   MRSA by PCR NEGATIVE NEGATIVE Final    Comment:        The GeneXpert MRSA Assay (FDA approved for NASAL specimens only), is one component of a comprehensive MRSA colonization surveillance program. It is not intended to diagnose MRSA infection nor to guide or monitor treatment for MRSA infections.      Labs: Basic Metabolic Panel:  Recent Labs Lab 10/26/15 0616 10/26/15 1514 10/26/15 1636 10/27/15 0247 10/28/15 0357 10/29/15 0939  NA 128* 130*  --  129* 133* 136  K 4.1 3.9  --  4.0 4.0  4.4  CL 79* 80*  --  79* 81* 86*  CO2 40* 44*  --  40* 43* 43*  GLUCOSE 161* 130*  --  136* 161* 216*  BUN 16 17  --  24* 34* 39*  CREATININE 0.79 0.97  --  1.12 1.21 1.23  CALCIUM 9.5 9.4  --  9.2 9.2 9.4  MG  --   --  2.2 2.2  --  2.6*  PHOS  --   --  2.5  --   --   --    Liver Function Tests:  Recent Labs Lab 10/27/15 0247  AST 20  ALT 17  ALKPHOS 61  BILITOT 0.5  PROT 5.3*  ALBUMIN 2.9*   No results for input(s): LIPASE, AMYLASE in the last 168 hours.  Recent Labs Lab 10/26/15 1636  AMMONIA 25   CBC:  Recent Labs Lab 10/26/15 0616 10/27/15 0247 10/28/15 1125  WBC 10.9* 9.8 17.9*  HGB 9.9* 9.7* 10.6*  HCT 33.3* 31.7* 34.5*  MCV 89.0 87.8 88.0  PLT 132* 113* 155   Cardiac Enzymes:  Recent Labs Lab 10/26/15 1440 10/26/15 1953 10/27/15 0247  TROPONINI <0.03 <0.03 <0.03   BNP: Invalid input(s): POCBNP CBG:  Recent Labs Lab 10/28/15 0747 10/28/15 1145 10/28/15 1638 10/29/15 0747 10/29/15 1129  GLUCAP 143* 224* 129* 155* 161*    Time coordinating discharge:  Greater than 30 minutes  Signed:  Chastin Garlitz, DO [facility] Pager: [phone] 10/29/2015, 12:23 PM

## 2015-10-28 NOTE — Progress Notes (Signed)
PROGRESS NOTE  Calvin [name] GGE:[id number] DOB: January 08, 1933 DOA: 10/26/2015 PCP:  [name], MD  Brief History:  82 y/o male with history of lung cancer s/p Left pneumonectomy in 2000, COPD, CAD status post CABG 10 years ago, atrial fibrillation, chronic respiratory failure on 1.5 L, but it is mellitus, hypertension, hyperlipidemia presented with 3-4 day history of increasing confusion and generalized weakness. The patient was encephalopathic and history was obtained from the patient's wife. Apparently, the patient had difficulty with ambulation and coordination. At baseline, the patient is able to perform all activities of daily living without difficulty, but his wife has noted decreased oral intake for 3-4 days prior to admission. In addition, the patient's oxygen saturations at home had dropped into the 70s and the patient's wife increased his baseline oxygen 2 L. In emergency department ABG revealed 7.26/119/113/48. The patient was placed on BiPAP, and pulmonary medicine was consulted to assist.  Assessment/Plan: Acute on chronic respiratory failure with hypoxia and hypercarbia -Due to COPD exacerbation -Wean intravenous steroids to q 12 hours -Patient has been transitioned off BiPAP -Wean oxygen to keep saturation 90-92 percent -Continue bronchodilators -Appreciate pulmonary follow-up--> concern for pulmonary edema-->ordered additional dose of furosemide for this morning (patient has received 3 doses IV) -Discontinue antibiotics--remains stable clinically  Acute metabolic encephalopathy -Secondary to respiratory failure with hypoxia and hypercarbia -10/27/2015--much improved -Serum B12 357 -Ammonia 25 -TSH is 0.800 -Urinalysis negative pyuria -CT brain negative  Hyponatremia -Urine and serum osmolarity suggested degree of SIADH--likely due to acute medical illness -will not continue IV lasix -am BMP  Atypical chest pain/CAD -Troponins negative 3 -Personally reviewed  EKG--nonspecific ST changes -No chest pain presently -Increase coreg dose  Thrombocytopenia -B12--357  -INR 1.11  -PTT 28  -Check fibrinogen--363  Hypertension  -increase carvedilol -restart cardizem  Atrial flutter  -Presently in sinus rhythm with PACs   Renal artery stenosis  -This has likely been a chronic issue  -Follow up with [facility] in outpt setting -this is contributing to his labile HTN  Hyperlipidemia  -Continue statin   Gait instability and headache -MRI brain--neg for acute findings  Diabetes mellitus type 2 -10/26/2015 A1c 6.2 -Discontinue metformin for now -ISS -controlled  Disposition Plan: Home 7/9 or 7/10 Family Communication:wife updated at bedside 7/8  Consultants: PCCM  Code Status: FULL  DVT Prophylaxis: apixaban    Subjective: She states that he is breathing about the same as yesterday. He denies any fevers, chills, chest pain, nausea, vomiting, diarrhea, abdominal pain. No dysuria or hematuria. No rashes. No hematochezia or melena.  Objective: Filed Vitals:   10/28/15 0745 10/28/15 0906 10/28/15 1143 10/28/15 1249  BP: 158/83  115/80 133/84  Pulse: 76 101 74 72  Temp: 98 F (36.7 C)  98 F (36.7 C) 98.2 F (36.8 C)  TempSrc: Oral  Oral Oral  Resp: '16 16 15 18  '$ Height:    '5\' 10"'$  (1.778 m)  Weight:    91.536 kg (201 lb 12.8 oz)  SpO2: 98% 97% 95% 97%    Intake/Output Summary (Last 24 hours) at 10/28/15 1618 Last data filed at 10/28/15 1413  Gross per 24 hour  Intake    820 ml  Output   1451 ml  Net   -631 ml   Weight change: -0.62 kg (-1 lb 5.9 oz) Exam:   General:  Pt is alert, follows commands appropriately, not in acute distress  HEENT: No icterus, No thrush, No neck mass,  [place]/AT  Cardiovascular: RRR, S1/S2, no rubs, no gallops  Respiratory: Bibasilar crackles. No wheezing. Air movement  Abdomen: Soft/+BS, non tender, non distended, no guarding  Extremities: No edema, No lymphangitis, No petechiae, No  rashes, no synovitis   Data Reviewed: I have personally reviewed following labs and imaging studies Basic Metabolic Panel:  Recent Labs Lab 10/26/15 0616 10/26/15 1514 10/26/15 1636 10/27/15 0247 10/28/15 0357  NA 128* 130*  --  129* 133*  K 4.1 3.9  --  4.0 4.0  CL 79* 80*  --  79* 81*  CO2 40* 44*  --  40* 43*  GLUCOSE 161* 130*  --  136* 161*  BUN 16 17  --  24* 34*  CREATININE 0.79 0.97  --  1.12 1.21  CALCIUM 9.5 9.4  --  9.2 9.2  MG  --   --  2.2 2.2  --   PHOS  --   --  2.5  --   --    Liver Function Tests:  Recent Labs Lab 10/27/15 0247  AST 20  ALT 17  ALKPHOS 61  BILITOT 0.5  PROT 5.3*  ALBUMIN 2.9*   No results for input(s): LIPASE, AMYLASE in the last 168 hours.  Recent Labs Lab 10/26/15 1636  AMMONIA 25   Coagulation Profile:  Recent Labs Lab 10/26/15 0616  INR 1.11   CBC:  Recent Labs Lab 10/26/15 0616 10/27/15 0247 10/28/15 1125  WBC 10.9* 9.8 17.9*  HGB 9.9* 9.7* 10.6*  HCT 33.3* 31.7* 34.5*  MCV 89.0 87.8 88.0  PLT 132* 113* 155   Cardiac Enzymes:  Recent Labs Lab 10/26/15 1440 10/26/15 1953 10/27/15 0247  TROPONINI <0.03 <0.03 <0.03   BNP: Invalid input(s): POCBNP CBG:  Recent Labs Lab 10/27/15 1144 10/27/15 1537 10/27/15 2110 10/28/15 0747 10/28/15 1145  GLUCAP 169* 182* 154* 143* 224*   HbA1C:  Recent Labs  10/26/15 1141  HGBA1C 6.2*   Urine analysis:    Component Value Date/Time   COLORURINE YELLOW 10/26/2015 1031   APPEARANCEUR CLEAR 10/26/2015 1031   LABSPEC 1.024 10/26/2015 1031   PHURINE 6.5 10/26/2015 1031   GLUCOSEU NEGATIVE 10/26/2015 1031   HGBUR SMALL* 10/26/2015 1031   [place] 10/26/2015 1031   KETONESUR NEGATIVE 10/26/2015 1031   PROTEINUR >300* 10/26/2015 1031   UROBILINOGEN 0.2 06/02/2007 2336   NITRITE NEGATIVE 10/26/2015 1031   LEUKOCYTESUR NEGATIVE 10/26/2015 1031   Sepsis Labs: '@LABRCNTIP'$ (procalcitonin:4,lacticidven:4) ) Recent Results (from the past 240  hour(s))  Urine culture     Status: None   Collection Time: 10/26/15 10:31 AM  Result Value Ref Range Status   Specimen Description URINE, CLEAN CATCH  Final   Special Requests NONE  Final   Culture NO GROWTH  Final   Report Status 10/27/2015 FINAL  Final  MRSA PCR Screening     Status: None   Collection Time: 10/26/15  8:38 PM  Result Value Ref Range Status   MRSA by PCR NEGATIVE NEGATIVE Final    Comment:        The GeneXpert MRSA Assay (FDA approved for NASAL specimens only), is one component of a comprehensive MRSA colonization surveillance program. It is not intended to diagnose MRSA infection nor to guide or monitor treatment for MRSA infections.      Scheduled Meds: . antiseptic oral rinse  7 mL Mouth Rinse BID  . apixaban  5 mg Oral BID  . carvedilol  12.5 mg Oral BID WC  . cholecalciferol  2,000 Units Oral  BID  . diltiazem  180 mg Oral QHS  . insulin aspart  0-9 Units Subcutaneous TID WC  . ipratropium-albuterol  3 mL Nebulization TID  . methylPREDNISolone (SOLU-MEDROL) injection  40 mg Intravenous Q12H   Continuous Infusions:   Procedures/Studies: Dg Chest 2 View  10/26/2015  CLINICAL DATA:  Chest pain and short of breath EXAM: CHEST  2 VIEW COMPARISON:  09/22/2015 FINDINGS: Left pneumonectomy. Extensive pleural calcification on the left is unchanged. Pulmonary vascular congestion is present in the right lung suggesting mild fluid overload. No edema or effusion. Negative for pneumonia. IMPRESSION: Left pneumonectomy. Mild pulmonary vascular congestion is present in the right lung without edema. Electronically Signed   By: Franchot Gallo M.D.   On: 10/26/2015 07:34   Ct Head Wo Contrast  10/26/2015  CLINICAL DATA:  82 year old male inpatient with reported history of encephalopathy, headache and dizziness. EXAM: CT HEAD WITHOUT CONTRAST TECHNIQUE: Contiguous axial images were obtained from the base of the skull through the vertex without intravenous contrast.  COMPARISON:  09/02/2013 head CT. FINDINGS: No evidence of parenchymal hemorrhage or extra-axial fluid collection. No mass lesion, mass effect, or midline shift. No CT evidence of acute infarction. Intracranial atherosclerosis. Nonspecific mild subcortical and periventricular white matter hypodensity, most in keeping with chronic small vessel ischemic change. Cerebral volume is age appropriate. No ventriculomegaly. The visualized paranasal sinuses are essentially clear. The mastoid air cells are unopacified. No evidence of calvarial fracture. IMPRESSION: 1.  No evidence of acute intracranial abnormality. 2. Mild chronic small vessel ischemia. Electronically Signed   By: Ilona Sorrel M.D.   On: 10/26/2015 17:20   Ct Angio Chest Pe W Or Wo Contrast  10/26/2015  ADDENDUM REPORT: 10/26/2015 17:58 ADDENDUM: Large right thyroid nodule which has been present since 2011. This could be more definitively characterized with ultrasound. Electronically Signed   By: Markus Daft M.D.   On: 10/26/2015 17:58  10/26/2015  CLINICAL DATA:  82 year old with shortness of breath. History of lung cancer and left pneumonectomy. EXAM: CT ANGIOGRAPHY CHEST WITH CONTRAST TECHNIQUE: Multidetector CT imaging of the chest was performed using the standard protocol during bolus administration of intravenous contrast. Multiplanar CT image reconstructions and MIPs were obtained to evaluate the vascular anatomy. CONTRAST:  100 mL Isovue 370 COMPARISON:  Chest CTA 01/19/2010 FINDINGS: Cardiovascular: No evidence for a pulmonary embolism. Prior ligation of the left main pulmonary artery due to left pneumonectomy. Coronary arteries and thoracic aorta are heavily calcified. No evidence to suggest an aortic dissection. Extensive plaque involving the descending thoracic aorta. At least mild stenosis at the origin of celiac trunk. SMA is patent. High-grade stenosis of the left renal artery with near occlusion. There is extensive plaque involving the proximal  right renal artery with at least moderate stenosis. Mediastinum/Nodes: Post left pneumonectomy changes with extensive left pleural calcifications and slightly dense fluid in the left hemithorax. These findings are similar to the prior examination. 2.8 cm low-density nodule in the right thyroid lobe is similar to the exam in 2011. No suspicious mediastinal or hilar lymphadenopathy. Mild fullness in the subcarinal region is thought to be associated with the esophagus. No suspicious axillary lymphadenopathy. Lungs/Pleura: Emphysematous changes in the right upper lung. There is stable right apical scarring. Dependent densities in the right lower lobe could represent atelectasis. No significant airspace disease or consolidation. Upper Abdomen: Mild to moderate distention of the visualized gallbladder. Diffuse thickening of the left adrenal gland is similar to the previous examination and probably represents hyperplasia. Similarly, there  is probably hyperplasia in the right adrenal gland. Left kidney is atrophic. No acute abnormality in the upper abdomen. Musculoskeletal: Prior median sternotomy. No acute bone abnormality. Review of the MIP images confirms the above findings. IMPRESSION: Negative for pulmonary embolism. Stable postsurgical changes associated with the left pneumonectomy. Dependent densities in the right lung probably represent atelectasis. No other significant right lung disease. Extensive atherosclerotic disease involving the aorta and visceral arteries. Bilateral renal artery stenosis. Near occlusion of the left renal artery with left renal atrophy. Electronically Signed: By: Markus Daft M.D. On: 10/26/2015 17:34   Mr Brain Wo Contrast  10/27/2015  CLINICAL DATA:  Altered mental status and headache. Extremity weakness. EXAM: MRI HEAD WITHOUT CONTRAST MRI CERVICAL SPINE WITHOUT CONTRAST TECHNIQUE: Multiplanar, multiecho pulse sequences of the brain and surrounding structures, and cervical spine, to  include the craniocervical junction and cervicothoracic junction, were obtained without intravenous contrast. COMPARISON:  CT 10/26/2015.  MRI 05/18/2011 FINDINGS: MRI HEAD FINDINGS Image quality degraded by mild motion. Generalized atrophy.  Negative for hydrocephalus Negative for acute infarct. Mild chronic microvascular ischemic changes in the white matter. 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 Brainstem and cerebellum intact. Negative for intracranial hemorrhage. No mass or edema. No shift of the midline structures Paranasal sinuses clear. No orbital lesion. Pituitary not enlarged. MRI CERVICAL SPINE FINDINGS Alignment: Normal alignment. Reversal of the cervical lordosis with mild kyphosis. Vertebrae: Negative for fracture or mass.  Normal bone marrow. Cord: No cord lesion identified. Motion degraded images with limited evaluation of the cord. Posterior Fossa, vertebral arteries, paraspinal tissues: Negative Disc levels: C1-2: Degenerative changes C1-C2 without significant stenosis. C2-3: Mild disc and facet degeneration. No significant spinal or foraminal stenosis C3-4: Moderate disc degeneration and spondylosis with diffuse uncinate spurring. Bilateral facet hypertrophy. Mild spinal stenosis and mild foraminal stenosis bilaterally C4-5: Disc degeneration and mild to moderate spondylosis left greater than right. Bilateral facet hypertrophy. Moderate left foraminal encroachment and mild right foraminal encroachment. Mild spinal stenosis. C5-6: Disc degeneration and moderate spondylosis. Mild spinal stenosis and moderate foraminal encroachment bilaterally. Bilateral facet degeneration C6-7: Disc degeneration and spondylosis with diffuse uncinate spurring and bilateral facet degeneration. Mild spinal stenosis and moderate foraminal encroachment bilaterally C7-T1:  Mild facet degeneration. IMPRESSION: Atrophy and mild chronic ischemic change. No acute intracranial abnormality Cervical spondylosis.  Multilevel disc and facet degeneration and spurring causing spinal and foraminal encroachment at multiple levels as described above. Electronically Signed   By: Franchot Gallo M.D.   On: 10/27/2015 11:56   Mr Cervical Spine Wo Contrast  10/27/2015  CLINICAL DATA:  Altered mental status and headache. Extremity weakness. EXAM: MRI HEAD WITHOUT CONTRAST MRI CERVICAL SPINE WITHOUT CONTRAST TECHNIQUE: Multiplanar, multiecho pulse sequences of the brain and surrounding structures, and cervical spine, to include the craniocervical junction and cervicothoracic junction, were obtained without intravenous contrast. COMPARISON:  CT 10/26/2015.  MRI 05/18/2011 FINDINGS: MRI HEAD FINDINGS Image quality degraded by mild motion. Generalized atrophy.  Negative for hydrocephalus Negative for acute infarct. Mild chronic microvascular ischemic changes in the white matter. 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 Brainstem and cerebellum intact. Negative for intracranial hemorrhage. No mass or edema. No shift of the midline structures Paranasal sinuses clear. No orbital lesion. Pituitary not enlarged. MRI CERVICAL SPINE FINDINGS Alignment: Normal alignment. Reversal of the cervical lordosis with mild kyphosis. Vertebrae: Negative for fracture or mass.  Normal bone marrow. Cord: No cord lesion identified. Motion degraded images with limited evaluation of the cord. Posterior Fossa, vertebral  arteries, paraspinal tissues: Negative Disc levels: C1-2: Degenerative changes C1-C2 without significant stenosis. C2-3: Mild disc and facet degeneration. No significant spinal or foraminal stenosis C3-4: Moderate disc degeneration and spondylosis with diffuse uncinate spurring. Bilateral facet hypertrophy. Mild spinal stenosis and mild foraminal stenosis bilaterally C4-5: Disc degeneration and mild to moderate spondylosis left greater than right. Bilateral facet hypertrophy. Moderate left foraminal encroachment and mild right  foraminal encroachment. Mild spinal stenosis. C5-6: Disc degeneration and moderate spondylosis. Mild spinal stenosis and moderate foraminal encroachment bilaterally. Bilateral facet degeneration C6-7: Disc degeneration and spondylosis with diffuse uncinate spurring and bilateral facet degeneration. Mild spinal stenosis and moderate foraminal encroachment bilaterally C7-T1:  Mild facet degeneration. IMPRESSION: Atrophy and mild chronic ischemic change. No acute intracranial abnormality Cervical spondylosis. Multilevel disc and facet degeneration and spurring causing spinal and foraminal encroachment at multiple levels as described above. Electronically Signed   By: Franchot Gallo M.D.   On: 10/27/2015 11:56   Dg Chest Port 1 View  10/27/2015  CLINICAL DATA:  Respiratory failure. EXAM: PORTABLE CHEST 1 VIEW COMPARISON:  10/26/2015.  CT 10/26/2015. FINDINGS: Left pneumonectomy. Dense pleural calcifications again noted. Mild right base subsegmental atelectasis. Prior median sternotomy. No pneumothorax. IMPRESSION: 1. Stable changes of left pneumonectomy with dense pleural calcification. 2. Mild right base subsegmental atelectasis. 3. Prior CABG.  Chest is stable from prior exams. Electronically Signed   By: Marcello Moores  Register   On: 10/27/2015 06:59   Dg Abd Portable 1v  10/26/2015  CLINICAL DATA:  Constipation EXAM: PORTABLE ABDOMEN - 1 VIEW COMPARISON:  07/04/2005 CT abdomen/pelvis FINDINGS: No dilated small bowel loops. Mild-to-moderate colonic stool volume. No evidence of pneumatosis, pneumoperitoneum or pathologic soft tissue calcifications. Moderate lumbar spondylosis. IMPRESSION: Nonobstructive bowel gas pattern. Mild-to-moderate colorectal stool volume. Electronically Signed   By: Ilona Sorrel M.D.   On: 10/26/2015 13:25    Cheyla Duchemin, DO  [facility] Pager [phone]  If 7PM-7AM, please contact night-coverage [email or web address] Password [email or web address] 10/28/2015, 4:18 PM   LOS: 1 day

## 2015-10-28 NOTE — Progress Notes (Signed)
Occupational Therapy Evaluation Patient Details Name: Calvin [name] MRN: [id number] DOB: [birth year] Today's Date: 10/28/2015    History of Present Illness 82 y.o. male with a PMH as outlined below including hx lung CA s/p left pneumonectomy in 2000, and severe COPD on 2L chronic O2. He presented to [facility] ED 07/06 with AMS along with 2 days of headache and dizziness. He also had chest pain on morning of presentation. Per his wife, he was acting "loopy", unable to keep balance, had loss of coordination, which prompted her to seek medical evaluation in ED. Dx acute on chronic hypoxemic and hypercapnic respiratory failure, mild pulm edema, metabolic encephalopathy.  MRI brain revealed no acute intracranial while MRI cervical spine revealed cervical spndylosis with multilevel disc and facet degeneration and spurring.  Pt's PMH includes hearling loss, a-flutter, depression, on home O2, stroke, GI bleed.   Clinical Impression   Pt admitted with the above diagnoses and presents with below problem list. Pt will benefit from continued acute OT to address the below listed deficits and maximize independence with BADLs prior to d/c home. PTA pt was mod I with ADLs. Pt is currently min guard to min A with LB ADLs, functional transfers, and mobility. Spouse present for session.      Follow Up Recommendations  Home health OT;Supervision/Assistance - 24 hour    Equipment Recommendations  None recommended by OT;Other (comment) (Pt and spouse report shower is too small for seat.)    Recommendations for Other Services       Precautions / Restrictions Precautions Precautions: Fall Precaution Comments: Monitor O2 Restrictions Weight Bearing Restrictions: No      Mobility Bed Mobility Overal bed mobility: Needs Assistance Bed Mobility: Supine to Sit;Sit to Supine     Supine to sit: Min guard;HOB elevated Sit to supine: Min guard   General bed mobility comments: Pt moves quickly, using bed  rail.  Transfers Overall transfer level: Needs assistance Equipment used: Rolling walker (2 wheeled) Transfers: Sit to/from Stand Sit to Stand: Min guard;Min assist         General transfer comment: Assist to stabilize rw as pt has a habit of using BUE on rw during sit<>stand. From EOB and toilet.    Balance Overall balance assessment: Needs assistance Sitting-balance support: No upper extremity supported;Feet supported Sitting balance-Leahy Scale: Good     Standing balance support: Bilateral upper extremity supported;During functional activity Standing balance-Leahy Scale: Poor Standing balance comment: external support for balance                            ADL Overall ADL's : Needs assistance/impaired Eating/Feeding: Set up;Sitting   Grooming: Set up;Sitting;Min guard;Standing Grooming Details (indicate cue type and reason): stood to wash hands/face, leaning some onto sink for support Upper Body Bathing: Set up;Sitting   Lower Body Bathing: Min guard;Minimal assistance;Sit to/from stand   Upper Body Dressing : Set up;Sitting   Lower Body Dressing: Min guard;Minimal assistance;Sit to/from stand   Toilet Transfer: Min guard;Ambulation;RW;Minimal assistance   Toileting- Clothing Manipulation and Hygiene: Set up;Min guard;Sitting/lateral lean;Sit to/from stand   Tub/ Shower Transfer: Minimal assistance;Ambulation   Functional mobility during ADLs: Min guard;Minimal assistance;Rolling walker General ADL Comments: Pt ambualted in room to complete toilet trasnfer and grooming task at sink. Pt with a couple of minor LOB/swaying during turns requiring light min A to correct. Cued to slow down pace of ambulation.      Vision  Perception     Praxis      Pertinent Vitals/Pain Pain Assessment: No/denies pain     Hand Dominance     Extremity/Trunk Assessment Upper Extremity Assessment Upper Extremity Assessment: Overall WFL for tasks assessed    Lower Extremity Assessment Lower Extremity Assessment: Defer to PT evaluation   Cervical / Trunk Assessment Cervical / Trunk Assessment: Kyphotic   Communication Communication Communication: HOH;Other (comment) (has hearing aide but does not wear it)   Cognition Arousal/Alertness: Awake/alert Behavior During Therapy: WFL for tasks assessed/performed Overall Cognitive Status: Within Functional Limits for tasks assessed                     General Comments       Exercises       Shoulder Instructions      Home Living Family/patient expects to be discharged to:: Private residence Living Arrangements: Spouse/significant other Available Help at Discharge: Family;Available 24 hours/day Type of Home: House Home Access: Stairs to enter [employer] of Steps: 1   Home Layout: One level     Bathroom Shower/Tub: Walk-in shower;Tub/shower unit   [employer]: Standard     Home Equipment: Grab bars - tub/shower;Cane - single point;Walker - 2 wheels;Toilet riser          Prior Functioning/Environment Level of Independence: Independent        Comments: Ambualting without AD.  Still driving. Wife does cooking and cleaning but pt does the yard work.  Pt was on 1.5 L O2 but recently bumped up to 2 L due to onset of symptoms.    OT Diagnosis: Acute pain;Generalized weakness   OT Problem List: Impaired balance (sitting and/or standing);Decreased activity tolerance;Decreased knowledge of use of DME or AE;Decreased knowledge of precautions;Cardiopulmonary status limiting activity   OT Treatment/Interventions: Self-care/ADL training;Energy conservation;DME and/or AE instruction;Therapeutic activities;Patient/family education;Balance training    OT Goals(Current goals can be found in the care plan section) Acute Rehab OT Goals Patient Stated Goal: to return home OT Goal Formulation: With patient/family Time For Goal Achievement: 11/11/15 Potential to Achieve  Goals: Good ADL Goals Pt Will Perform Grooming: with modified independence;sitting;standing Pt Will Perform Lower Body Bathing: with modified independence;with adaptive equipment;sit to/from stand Pt Will Perform Lower Body Dressing: with modified independence;sit to/from stand;with adaptive equipment Pt Will Transfer to Toilet: with modified independence;ambulating Pt Will Perform Toileting - Clothing Manipulation and hygiene: with modified independence;sit to/from stand;sitting/lateral leans Pt Will Perform Tub/Shower Transfer: with supervision;ambulating;rolling walker  OT Frequency: Min 2X/week   Barriers to D/C:            Co-evaluation              End of Session Equipment Utilized During Treatment: Gait belt;Rolling walker;Oxygen Nurse Communication: Other (comment) (O2 levels during ambulation.)  Activity Tolerance: Patient tolerated treatment well Patient left: in bed;with call bell/phone within reach;with family/visitor present   Time: 9021-1155 OT Time Calculation (min): 22 min Charges:  OT General Charges $OT Visit: 1 Procedure OT Evaluation $OT Eval Low Complexity: 1 Procedure G-Codes:    Hortencia Pilar October 29, 2015, 3:10 PM

## 2015-10-29 LAB — BASIC METABOLIC PANEL
Anion gap: 7 (ref 5–15)
BUN: 39 mg/dL — AB (ref 6–20)
CALCIUM: 9.4 mg/dL (ref 8.9–10.3)
CO2: 43 mmol/L — ABNORMAL HIGH (ref 22–32)
CREATININE: 1.23 mg/dL (ref 0.61–1.24)
Chloride: 86 mmol/L — ABNORMAL LOW (ref 101–111)
GFR calc Af Amer: >60 mL/min (ref >60)
GFR, EST NON AFRICAN AMERICAN: 54 mL/min — AB (ref >60)
GLUCOSE: 216 mg/dL — AB (ref 65–99)
Potassium: 4.4 mmol/L (ref 3.5–5.1)
SODIUM: 136 mmol/L (ref 135–145)

## 2015-10-29 LAB — GLUCOSE, CAPILLARY
Glucose-Capillary: 155 mg/dL — ABNORMAL HIGH (ref 65–99)
Glucose-Capillary: 161 mg/dL — ABNORMAL HIGH (ref 65–99)

## 2015-10-29 LAB — MAGNESIUM: Magnesium: 2.6 mg/dL — ABNORMAL HIGH (ref 1.7–2.4)

## 2015-10-29 MED ORDER — PREDNISONE 50 MG PO TABS: 50.0000 mg | ORAL_TABLET | Freq: Every day | ORAL | Status: DC

## 2015-10-29 MED ORDER — PREDNISONE 10 MG PO TABS: 50.0000 mg | ORAL_TABLET | Freq: Every day | ORAL | Status: DC

## 2015-10-29 MED ORDER — LOSARTAN POTASSIUM-HCTZ 100-25 MG PO TABS: 0.5000 | ORAL_TABLET | Freq: Every day | ORAL | Status: DC

## 2015-10-29 NOTE — Progress Notes (Signed)
CM met with pt in room to offer choice of home health agency.  Pt and spouse of pt choose [facility] to render [facility] services. Referral called to [facility] rep, Tiffany. CM called [facility] DME rep, Germaine to please deliver a loaner tank to room prior to discharge today. No other Cm needs were communicated.

## 2015-10-29 NOTE — Progress Notes (Signed)
Pt has orders to be discharged. Discharge instructions given and pt has no additional questions at this time. Medication regimen reviewed and pt educated. Pt verbalized understanding and has no additional questions. Telemetry box removed. IV removed and site in good condition. Pt stable and waiting for transportation.   Jennifer Holland RN 

## 2015-11-06 ENCOUNTER — Telehealth: Payer: Self-pay | Admitting: Nurse Practitioner

## 2015-11-06 ENCOUNTER — Other Ambulatory Visit: Payer: Self-pay | Admitting: *Deleted

## 2015-11-06 MED ORDER — CARVEDILOL 12.5 MG PO TABS: 12.5000 mg | ORAL_TABLET | Freq: Two times a day (BID) | ORAL | Status: DC

## 2015-11-06 NOTE — Telephone Encounter (Signed)
Ok to cut back to the 12.5 mg BID.  Continue to monitor BP

## 2015-11-06 NOTE — Telephone Encounter (Signed)
S/w pt's wife per DPR is aware of decrease in Coreg (12.5 mg ) bid and to continue to monitor bp and HR.  Keep up coming appointment in September

## 2015-11-06 NOTE — Telephone Encounter (Signed)
New message     Pt c/o BP issue: STAT if pt c/o blurred vision, one-sided weakness or slurred speech  1. What are your last 5 BP readings?  Sat 119/57, sun 116-56, last thurs 102-62, bp has not been taken this am 2. Are you having any other symptoms (ex. Dizziness, headache, blurred vision, passed out)? Tired and weak 3. What is your BP issue? bp is low.  Can he cut back on the coreg?  He was taking 12.'5mg'$  and now it is '25mg'$  bid.

## 2015-11-17 ENCOUNTER — Telehealth: Payer: Self-pay | Admitting: Interventional Cardiology

## 2015-11-17 MED ORDER — CARVEDILOL 12.5 MG PO TABS: 12.5000 mg | ORAL_TABLET | Freq: Two times a day (BID) | ORAL | 1 refills | Status: DC

## 2015-11-17 NOTE — Telephone Encounter (Signed)
Patient c/o Palpitations:  High priority if patient c/o lightheadedness and shortness of breath.  1. How long have you been having palpitations? Last night 7/27  2. Are you currently experiencing lightheadedness and shortness of breath? no  3. Have you checked your BP and heart rate? (document readings) HR has been fluctuating from 40s to 130s,  This am BP- 108/70 p 94  4. Are you experiencing any other symptoms? no  Pt wife wanted to know if pt can take extra cardizem. Please call back and discuss.

## 2015-11-17 NOTE — Telephone Encounter (Signed)
**Note De-Identified Hedda Crumbley Obfuscation** Per Bonney Leitz, PA (FLEX) the pts wife is advised that she can give the pt an additional dose of Cardizem 180 mg once today only if the pts HR is above 110 bpm. She verbalized understanding and thanked my for my assistance.

## 2015-11-17 NOTE — Telephone Encounter (Signed)
**Note De-Identified Calvin [name] Obfuscation** The pts wife states that the pt having palpitations that started last night. She reports that his HR has ranged from 40s to 130s since then. She states that the pt is denying dizziness, lightheadedness, SOB, CP, weakness or fatigue. She is advised that I will discuss with our DOD and call her back. She verbalized understanding.

## 2015-11-20 ENCOUNTER — Other Ambulatory Visit: Payer: Self-pay | Admitting: *Deleted

## 2015-11-20 MED ORDER — CARVEDILOL 12.5 MG PO TABS: 12.5000 mg | ORAL_TABLET | Freq: Two times a day (BID) | ORAL | 1 refills | Status: AC

## 2015-11-25 ENCOUNTER — Emergency Department (HOSPITAL_COMMUNITY): Payer: Medicare Other

## 2015-11-25 ENCOUNTER — Encounter (HOSPITAL_COMMUNITY): Payer: Self-pay

## 2015-11-25 ENCOUNTER — Inpatient Hospital Stay (HOSPITAL_COMMUNITY)
Admission: EM | Admit: 2015-11-25 | Discharge: 2015-11-29 | DRG: 291 | Disposition: A | Discharge status: Hospice | Payer: Medicare Other | Attending: Family Medicine | Admitting: Family Medicine

## 2015-11-25 DIAGNOSIS — Z9981 Dependence on supplemental oxygen: Secondary | ICD-10-CM

## 2015-11-25 DIAGNOSIS — Z9889 Other specified postprocedural states: Secondary | ICD-10-CM

## 2015-11-25 DIAGNOSIS — I10 Essential (primary) hypertension: Secondary | ICD-10-CM | POA: Diagnosis present

## 2015-11-25 DIAGNOSIS — I272 Other secondary pulmonary hypertension: Secondary | ICD-10-CM | POA: Diagnosis present

## 2015-11-25 DIAGNOSIS — R0602 Shortness of breath: Secondary | ICD-10-CM | POA: Diagnosis not present

## 2015-11-25 DIAGNOSIS — E119 Type 2 diabetes mellitus without complications: Secondary | ICD-10-CM

## 2015-11-25 DIAGNOSIS — J9601 Acute respiratory failure with hypoxia: Secondary | ICD-10-CM | POA: Diagnosis present

## 2015-11-25 DIAGNOSIS — J9602 Acute respiratory failure with hypercapnia: Secondary | ICD-10-CM | POA: Diagnosis present

## 2015-11-25 DIAGNOSIS — H9190 Unspecified hearing loss, unspecified ear: Secondary | ICD-10-CM | POA: Diagnosis present

## 2015-11-25 DIAGNOSIS — I2510 Atherosclerotic heart disease of native coronary artery without angina pectoris: Secondary | ICD-10-CM | POA: Diagnosis present

## 2015-11-25 DIAGNOSIS — E1165 Type 2 diabetes mellitus with hyperglycemia: Secondary | ICD-10-CM | POA: Diagnosis present

## 2015-11-25 DIAGNOSIS — K5901 Slow transit constipation: Secondary | ICD-10-CM

## 2015-11-25 DIAGNOSIS — R079 Chest pain, unspecified: Secondary | ICD-10-CM

## 2015-11-25 DIAGNOSIS — Z85118 Personal history of other malignant neoplasm of bronchus and lung: Secondary | ICD-10-CM

## 2015-11-25 DIAGNOSIS — I5031 Acute diastolic (congestive) heart failure: Secondary | ICD-10-CM | POA: Diagnosis present

## 2015-11-25 DIAGNOSIS — I4892 Unspecified atrial flutter: Secondary | ICD-10-CM | POA: Diagnosis present

## 2015-11-25 DIAGNOSIS — Z79899 Other long term (current) drug therapy: Secondary | ICD-10-CM

## 2015-11-25 DIAGNOSIS — J9621 Acute and chronic respiratory failure with hypoxia: Secondary | ICD-10-CM | POA: Diagnosis present

## 2015-11-25 DIAGNOSIS — I110 Hypertensive heart disease with heart failure: Secondary | ICD-10-CM | POA: Diagnosis not present

## 2015-11-25 DIAGNOSIS — J441 Chronic obstructive pulmonary disease with (acute) exacerbation: Secondary | ICD-10-CM

## 2015-11-25 DIAGNOSIS — R51 Headache: Secondary | ICD-10-CM | POA: Diagnosis present

## 2015-11-25 DIAGNOSIS — Z66 Do not resuscitate: Secondary | ICD-10-CM | POA: Diagnosis present

## 2015-11-25 DIAGNOSIS — K219 Gastro-esophageal reflux disease without esophagitis: Secondary | ICD-10-CM | POA: Diagnosis present

## 2015-11-25 DIAGNOSIS — Z8249 Family history of ischemic heart disease and other diseases of the circulatory system: Secondary | ICD-10-CM

## 2015-11-25 DIAGNOSIS — T380X5A Adverse effect of glucocorticoids and synthetic analogues, initial encounter: Secondary | ICD-10-CM | POA: Diagnosis present

## 2015-11-25 DIAGNOSIS — Z515 Encounter for palliative care: Secondary | ICD-10-CM

## 2015-11-25 DIAGNOSIS — I701 Atherosclerosis of renal artery: Secondary | ICD-10-CM | POA: Diagnosis present

## 2015-11-25 DIAGNOSIS — E785 Hyperlipidemia, unspecified: Secondary | ICD-10-CM | POA: Diagnosis present

## 2015-11-25 DIAGNOSIS — Z881 Allergy status to other antibiotic agents status: Secondary | ICD-10-CM

## 2015-11-25 DIAGNOSIS — I4891 Unspecified atrial fibrillation: Secondary | ICD-10-CM | POA: Diagnosis present

## 2015-11-25 DIAGNOSIS — J449 Chronic obstructive pulmonary disease, unspecified: Secondary | ICD-10-CM | POA: Diagnosis present

## 2015-11-25 DIAGNOSIS — D696 Thrombocytopenia, unspecified: Secondary | ICD-10-CM | POA: Diagnosis present

## 2015-11-25 DIAGNOSIS — J9622 Acute and chronic respiratory failure with hypercapnia: Secondary | ICD-10-CM | POA: Diagnosis present

## 2015-11-25 DIAGNOSIS — I509 Heart failure, unspecified: Secondary | ICD-10-CM

## 2015-11-25 DIAGNOSIS — Z7984 Long term (current) use of oral hypoglycemic drugs: Secondary | ICD-10-CM

## 2015-11-25 DIAGNOSIS — Z951 Presence of aortocoronary bypass graft: Secondary | ICD-10-CM

## 2015-11-25 DIAGNOSIS — Z888 Allergy status to other drugs, medicaments and biological substances status: Secondary | ICD-10-CM

## 2015-11-25 DIAGNOSIS — Z87891 Personal history of nicotine dependence: Secondary | ICD-10-CM

## 2015-11-25 DIAGNOSIS — Z7189 Other specified counseling: Secondary | ICD-10-CM

## 2015-11-25 DIAGNOSIS — I483 Typical atrial flutter: Secondary | ICD-10-CM

## 2015-11-25 DIAGNOSIS — Z902 Acquired absence of lung [part of]: Secondary | ICD-10-CM

## 2015-11-25 DIAGNOSIS — I480 Paroxysmal atrial fibrillation: Secondary | ICD-10-CM | POA: Diagnosis present

## 2015-11-25 DIAGNOSIS — R269 Unspecified abnormalities of gait and mobility: Secondary | ICD-10-CM | POA: Diagnosis present

## 2015-11-25 DIAGNOSIS — Z7901 Long term (current) use of anticoagulants: Secondary | ICD-10-CM

## 2015-11-25 DIAGNOSIS — Z833 Family history of diabetes mellitus: Secondary | ICD-10-CM

## 2015-11-25 DIAGNOSIS — Z7952 Long term (current) use of systemic steroids: Secondary | ICD-10-CM

## 2015-11-25 DIAGNOSIS — I5033 Acute on chronic diastolic (congestive) heart failure: Secondary | ICD-10-CM

## 2015-11-25 DIAGNOSIS — Z91030 Bee allergy status: Secondary | ICD-10-CM

## 2015-11-25 LAB — BRAIN NATRIURETIC PEPTIDE: B Natriuretic Peptide: 298.7 pg/mL — ABNORMAL HIGH (ref 0.0–100.0)

## 2015-11-25 LAB — CBC
HCT: 32.8 % — ABNORMAL LOW (ref 39.0–52.0)
HEMOGLOBIN: 9.5 g/dL — AB (ref 13.0–17.0)
MCH: 27.1 pg (ref 26.0–34.0)
MCHC: 29 g/dL — AB (ref 30.0–36.0)
MCV: 93.7 fL (ref 78.0–100.0)
Platelets: 261 10*3/uL (ref 150–400)
RBC: 3.5 MIL/uL — AB (ref 4.22–5.81)
RDW: 17.1 % — ABNORMAL HIGH (ref 11.5–15.5)
WBC: 9.4 10*3/uL (ref 4.0–10.5)

## 2015-11-25 LAB — I-STAT ARTERIAL BLOOD GAS, ED
Acid-Base Excess: 16 mmol/L — ABNORMAL HIGH (ref 0.0–2.0)
Bicarbonate: 44.2 mEq/L — ABNORMAL HIGH (ref 20.0–24.0)
O2 SAT: 92 %
PCO2 ART: 72.7 mmHg — AB (ref 35.0–45.0)
PO2 ART: 69 mmHg — AB (ref 80.0–100.0)
Patient temperature: 98.6
TCO2: 46 mmol/L (ref 0–100)
pH, Arterial: 7.392 (ref 7.350–7.450)

## 2015-11-25 LAB — I-STAT TROPONIN, ED: Troponin i, poc: 0.03 ng/mL (ref 0.00–0.08)

## 2015-11-25 LAB — MAGNESIUM: MAGNESIUM: 2.1 mg/dL (ref 1.7–2.4)

## 2015-11-25 LAB — BASIC METABOLIC PANEL
ANION GAP: 5 (ref 5–15)
BUN: 18 mg/dL (ref 6–20)
CALCIUM: 9.5 mg/dL (ref 8.9–10.3)
CHLORIDE: 90 mmol/L — AB (ref 101–111)
CO2: 41 mmol/L — AB (ref 22–32)
Creatinine, Ser: 1.21 mg/dL (ref 0.61–1.24)
GFR calc non Af Amer: 55 mL/min — ABNORMAL LOW (ref >60)
GLUCOSE: 141 mg/dL — AB (ref 65–99)
Potassium: 4.4 mmol/L (ref 3.5–5.1)
Sodium: 136 mmol/L (ref 135–145)

## 2015-11-25 LAB — GLUCOSE, CAPILLARY: Glucose-Capillary: 338 mg/dL — ABNORMAL HIGH (ref 65–99)

## 2015-11-25 MED ORDER — SODIUM CHLORIDE 0.9% FLUSH: 3.0000 mL | INTRAVENOUS | Status: DC | PRN

## 2015-11-25 MED ORDER — LORATADINE 10 MG PO TABS: 10.0000 mg | ORAL_TABLET | Freq: Every day | ORAL | Status: DC | PRN

## 2015-11-25 MED ORDER — CARVEDILOL 12.5 MG PO TABS
12.5000 mg | ORAL_TABLET | Freq: Two times a day (BID) | ORAL | Status: DC
  Administered 2015-11-25 – 2015-11-29 (×8): 12.5 mg via ORAL
  Filled 2015-11-25 (×8): qty 1

## 2015-11-25 MED ORDER — SODIUM CHLORIDE 0.9% FLUSH
3.0000 mL | Freq: Two times a day (BID) | INTRAVENOUS | Status: DC
  Administered 2015-11-25 – 2015-11-29 (×8): 3 mL via INTRAVENOUS

## 2015-11-25 MED ORDER — IPRATROPIUM-ALBUTEROL 0.5-2.5 (3) MG/3ML IN SOLN
3.0000 mL | Freq: Once | RESPIRATORY_TRACT | Status: AC
  Administered 2015-11-25: 3 mL via RESPIRATORY_TRACT
  Filled 2015-11-25: qty 3

## 2015-11-25 MED ORDER — ACETAMINOPHEN 325 MG PO TABS
650.0000 mg | ORAL_TABLET | ORAL | Status: DC | PRN
  Administered 2015-11-25: 650 mg via ORAL
  Filled 2015-11-25: qty 2

## 2015-11-25 MED ORDER — METHYLPREDNISOLONE SODIUM SUCC 125 MG IJ SOLR
125.0000 mg | Freq: Once | INTRAMUSCULAR | Status: AC
  Administered 2015-11-25: 125 mg via INTRAVENOUS
  Filled 2015-11-25: qty 2

## 2015-11-25 MED ORDER — FUROSEMIDE 10 MG/ML IJ SOLN
80.0000 mg | Freq: Once | INTRAMUSCULAR | Status: AC
  Administered 2015-11-25: 80 mg via INTRAVENOUS
  Filled 2015-11-25: qty 8

## 2015-11-25 MED ORDER — LOSARTAN POTASSIUM 50 MG PO TABS: 50.0000 mg | ORAL_TABLET | Freq: Every day | ORAL | Status: DC

## 2015-11-25 MED ORDER — IPRATROPIUM-ALBUTEROL 0.5-2.5 (3) MG/3ML IN SOLN
3.0000 mL | Freq: Three times a day (TID) | RESPIRATORY_TRACT | Status: DC
  Administered 2015-11-25: 3 mL via RESPIRATORY_TRACT
  Filled 2015-11-25: qty 3

## 2015-11-25 MED ORDER — MAGNESIUM OXIDE 400 (241.3 MG) MG PO TABS
400.0000 mg | ORAL_TABLET | Freq: Every day | ORAL | Status: DC
  Administered 2015-11-25 – 2015-11-28 (×4): 400 mg via ORAL
  Filled 2015-11-25 (×4): qty 1

## 2015-11-25 MED ORDER — APIXABAN 5 MG PO TABS
5.0000 mg | ORAL_TABLET | Freq: Two times a day (BID) | ORAL | Status: DC
  Administered 2015-11-25 – 2015-11-29 (×8): 5 mg via ORAL
  Filled 2015-11-25 (×8): qty 1

## 2015-11-25 MED ORDER — OMEGA-3-ACID ETHYL ESTERS 1 G PO CAPS
1.0000 g | ORAL_CAPSULE | Freq: Two times a day (BID) | ORAL | Status: DC
  Administered 2015-11-25 – 2015-11-29 (×8): 1 g via ORAL
  Filled 2015-11-25 (×8): qty 1

## 2015-11-25 MED ORDER — DILTIAZEM HCL ER COATED BEADS 180 MG PO CP24
180.0000 mg | ORAL_CAPSULE | Freq: Every day | ORAL | Status: DC
  Administered 2015-11-25 – 2015-11-27 (×3): 180 mg via ORAL
  Filled 2015-11-25 (×3): qty 1

## 2015-11-25 MED ORDER — INSULIN ASPART 100 UNIT/ML ~~LOC~~ SOLN
0.0000 [IU] | Freq: Three times a day (TID) | SUBCUTANEOUS | Status: DC
  Administered 2015-11-26 (×2): 1 [IU] via SUBCUTANEOUS
  Administered 2015-11-26 – 2015-11-27 (×3): 2 [IU] via SUBCUTANEOUS
  Administered 2015-11-28 (×3): 1 [IU] via SUBCUTANEOUS

## 2015-11-25 MED ORDER — ONDANSETRON HCL 4 MG/2ML IJ SOLN: 4.0000 mg | Freq: Four times a day (QID) | INTRAMUSCULAR | Status: DC | PRN

## 2015-11-25 MED ORDER — VITAMIN D 1000 UNITS PO TABS
2000.0000 [IU] | ORAL_TABLET | Freq: Two times a day (BID) | ORAL | Status: DC
  Administered 2015-11-25 – 2015-11-29 (×8): 2000 [IU] via ORAL
  Filled 2015-11-25 (×8): qty 2

## 2015-11-25 MED ORDER — ADULT MULTIVITAMIN W/MINERALS CH
1.0000 | ORAL_TABLET | Freq: Every day | ORAL | Status: DC
  Administered 2015-11-26 – 2015-11-29 (×4): 1 via ORAL
  Filled 2015-11-25 (×4): qty 1

## 2015-11-25 MED ORDER — DOCUSATE SODIUM 100 MG PO CAPS
100.0000 mg | ORAL_CAPSULE | Freq: Two times a day (BID) | ORAL | Status: DC
  Administered 2015-11-25 – 2015-11-29 (×8): 100 mg via ORAL
  Filled 2015-11-25 (×8): qty 1

## 2015-11-25 MED ORDER — ASPIRIN 81 MG PO CHEW
324.0000 mg | CHEWABLE_TABLET | Freq: Once | ORAL | Status: DC
  Filled 2015-11-25: qty 4

## 2015-11-25 MED ORDER — INSULIN ASPART 100 UNIT/ML ~~LOC~~ SOLN
0.0000 [IU] | Freq: Every day | SUBCUTANEOUS | Status: DC
  Administered 2015-11-25: 4 [IU] via SUBCUTANEOUS
  Administered 2015-11-28: 5 [IU] via SUBCUTANEOUS

## 2015-11-25 MED ORDER — FUROSEMIDE 10 MG/ML IJ SOLN
40.0000 mg | Freq: Two times a day (BID) | INTRAMUSCULAR | Status: AC
  Administered 2015-11-25 – 2015-11-26 (×2): 40 mg via INTRAVENOUS
  Filled 2015-11-25 (×2): qty 4

## 2015-11-25 MED ORDER — SODIUM CHLORIDE 0.9 % IV SOLN: 250.0000 mL | INTRAVENOUS | Status: DC | PRN

## 2015-11-25 NOTE — ED Triage Notes (Signed)
Pt's wife c/o pt heart rate is irregular, pt has been more short of breath. Pt is A/O x 4, pt is very HOH. Is on home oxygen at 1.5 L/ m/[place]. Pt placed on 2 l at triage.

## 2015-11-25 NOTE — H&P (Signed)
History and Physical    Calvin [name] ENI:[id number] DOB: 1932-06-03 DOA: 11/25/2015   PCP:  [name], MD   Patient coming from/Resides with: Private residence/lives with wife  Chief Complaint: Progressive shortness of breath since Wednesday; palpitations  HPI: Calvin [name] is a 82 y.o. male with medical history significant for long-standing atrial fib and atrial flutter, underlying COPD status post left pneumonectomy lung cancer, chronic hypoxemic and hypercarbic respiratory failure on 2 L oxygen at home, diabetes, hypertension, pulmonary hypertension, CAD, chronic diastolic heart failure, dyslipidemia and chronic thrombocytopenia. Patient was recently discharged on 7/8 after an admission for acute respiratory failure with hypoxemia and hypercarbia requiring transient BiPAP treatment. His hypercarbia was associated with metabolic encephalopathy. During previous hospitalization patient was instructed to decrease preadmission Losartan from 100/25-50/12.5 and he was resumed on his previous dosages of Cardizem and carvedilol.   Since discharge patient has been doing relatively well and within a week after discharge she followed up with his primary care physician who noticed patient developing increasing edema symptoms and resumed his previous dose of losartan i.e. 100/25. Patient and wife have noticed patient having palpitations especially with activity and home health RN has documented heart rates up into the 130s with activity but quickly reduces down with rest. Cardiologist was notified by home health RN and instructions were to increase patient's diltiazem dosage if the tachycardia was sustained for greater than or equal to 1 hour. Patient has had recurrent episodes of nonsustained tachycardia for the past several weeks. Beginning this past Wednesday he began noticing increased shortness of breath. His nebulizer treatments were increased to 4 times a day without any improvement. His weight has remained  stable at 207 pounds (physician office based weight), he has not had any dietary indiscretion. Because he was so short of breath today the wife called the cardiology office to ask if she could turn the oxygen up and instead was instructed to be evaluated at the ER. Concerns were that given his history of hypercarbic respiratory failure he needed to be evaluated prior to adjusting oxygen dosage.  ED Course:  Vital signs: 98.8-115/57-79 at rest-16-2 L oxygen 100% O2 sats 2 view chest x-ray: Mild CHF, left pneumonectomy Lab data: Sodium 136, potassium 4.4, chloride 90, CO2 41, BUN 18, creatinine 1.21, glucose 141, poc troponin 0.03, WBCs 9400 differential not obtained, hemoglobin 9.5, platelets 261,000 Medications and treatments: Lasix 80 mg IV 1, Solu-Medrol 125 mg IV 1, DuoNeb 1   Review of Systems:  In addition to the HPI above,  No Fever-chills, myalgias or other constitutional symptoms No Headache, changes with Vision or hearing, new weakness, tingling, numbness in any extremity No problems swallowing food or Liquids, indigestion/reflux No Chest pain, Cough, orthopnea  No Abdominal pain, N/V; no melena or hematochezia, no dark tarry stools No dysuria, hematuria or flank pain No new skin rashes, lesions, masses or bruises, No new joints pains-aches No recent weight gain or loss No polyuria, polydypsia or polyphagia,   Past Medical History:  Diagnosis Date  . Arthritis   . Atrial flutter with rapid ventricular response ([place])    a. 2007 s/p DCCV following CV surgery;  b. 10/2014->CHA2DS2VASc = 5-->Xarelto.  . 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  Marland Kitchen CAD (coronary artery disease)    a. 06/2005 s/p CABG x1 (VG->RCA) @ time of myxoma resection.  . Carotid artery disease ([place])    a. 06/2005 s/p R CEA;  b. 10/2013 Carotid U/S: patent RICA, LICA  40-59%;  c. 10/2014 U/S: RICA 25-00%, LICA 37-04%.  Marland Kitchen COPD (chronic obstructive pulmonary disease) ([place])   . Daily headache   .  Depression   . Essential hypertension   . GERD (gastroesophageal reflux disease)   . GI bleed 07/2015  . Hearing loss   . History of pneumonia   . Hypertriglyceridemia   . On home oxygen therapy    "2L; 24/7" (11/03/2014)  . Scarlet fever    a. ~ 1942.  Marland Kitchen Squamous cell lung cancer ([place])    a. s/p L pneumonectomy in 2000.  . Stroke ([place])   . Type II diabetes mellitus ([place])     Past Surgical History:  Procedure Laterality Date  . CARDIAC CATHETERIZATION  06/2005; 01/2010   Calvin [name] 6/16/2012Marland Kitchen Calvin [name] 02/02/2010  . CARDIOVERSION  07/2005   DCC/notes 09/04/2010  . CAROTID ENDARTERECTOMY Right 2007  . COLONOSCOPY N/A 07/27/2015   Procedure: COLONOSCOPY;  Surgeon: Wonda Horner, MD;  Location: [facility] ENDOSCOPY;  Service: Endoscopy;  Laterality: N/A;  . COMBINED MEDIASTINOSCOPY AND BRONCHOSCOPY  05/01/1998   Dr Servando Snare  . CORONARY ARTERY BYPASS GRAFT  07/12/2005   Dr Servando Snare, CABG x 1 with reverse saphenous vein graft to distal rt coronary artery and excision of left atrial myxoma [Other]  . ESOPHAGOGASTRODUODENOSCOPY (EGD) WITH PROPOFOL N/A 07/26/2015   Procedure: ESOPHAGOGASTRODUODENOSCOPY (EGD) WITH PROPOFOL;  Surgeon: Wonda Horner, MD;  Location: [facility] ENDOSCOPY;  Service: Endoscopy;  Laterality: N/A;  . PNEUMONECTOMY Left 05/03/1998   Dr Servando Snare  . RENAL ANGIOGRAM Bilateral 12/2005   Calvin [name] 09/04/2010  . TRANSESOPHAGEAL ECHOCARDIOGRAM  06/2005   Calvin [name] 09/04/2010    Social History   Social History  . Marital status: Married    Spouse name: N/A  . Number of children: N/A  . Years of education: N/A   Occupational History  . retired Retired   Social History Main Topics  . Smoking status: Former Smoker    Packs/day: 2.00    Years: 43.00    Types: Cigarettes    Quit date: 07/21/1997  . Smokeless tobacco: Never Used  . Alcohol use 0.0 oz/week     Comment: 11/03/2014 "never drank much; stopped in the early 1970's"  . Drug use: No  . Sexual activity: Not Currently   Other Topics Concern    . Not on file   Social History Narrative  . No narrative on file    Mobility: Has a rolling walker to utilize if necessary Work history: Retired   Allergies  Allergen Reactions  . Bee Venom Anaphylaxis  . Sulfa Antibiotics   . Amoxicillin Rash  . Statins Other (See Comments)    Muscle aches    Family History  Problem Relation Age of Onset  . Heart disease Mother     NOT before age 1  . Heart attack Mother   . Diabetes Mother   . Hyperlipidemia Mother   . Heart disease Sister   . Cancer Sister     Breast  . Hypertension Sister   . Stomach cancer Father   . Cancer Father     Stomach  . Hyperlipidemia Father   . Hyperlipidemia Brother   . Heart disease Brother      Prior to Admission medications   Medication Sig Start Date End Date Taking? Authorizing Provider  acetaminophen (TYLENOL) 325 MG tablet Take 325 mg by mouth every 6 (six) hours as needed for mild pain, moderate pain or headache. For pain   Yes Historical Provider, MD  albuterol (PROVENTIL HFA;VENTOLIN HFA) 108 (90 BASE) MCG/ACT inhaler Inhale 2 puffs into the lungs every 6 (six) hours as needed for wheezing.   Yes Historical Provider, MD  apixaban (ELIQUIS) 5 MG TABS tablet Take 1 tablet (5 mg total) by mouth 2 (two) times daily. 07/31/15  Yes Thurnell Lose, MD  carvedilol (COREG) 12.5 MG tablet Take 1 tablet (12.5 mg total) by mouth 2 (two) times daily. 11/20/15  Yes Jettie Booze, MD  cetirizine (ZYRTEC) 10 MG tablet Take 10 mg by mouth daily as needed for allergies or rhinitis. For allergies   Yes Historical Provider, MD  cholecalciferol (VITAMIN D) 1000 UNITS tablet Take 2,000 Units by mouth 2 (two) times daily.    Yes Historical Provider, MD  diltiazem (CARDIZEM CD) 180 MG 24 hr capsule Take 1 capsule (180 mg total) by mouth at bedtime. 10/18/15  Yes Burtis Junes, NP  docusate sodium (COLACE) 100 MG capsule Take 2 capsules (200 mg total) by mouth daily. Patient taking differently: Take 100 mg  by mouth 2 (two) times daily.  07/31/15  Yes Thurnell Lose, MD  EPIPEN 2-PAK 0.3 MG/0.3ML DEVI Inject 0.3 mg into the skin See admin instructions. Take as needed for severe allergic reaction 11/04/11  Yes Historical Provider, MD  ipratropium-albuterol (DUONEB) 0.5-2.5 (3) MG/3ML SOLN Take 3 mLs by nebulization 3 (three) times daily. Patient taking differently: Take 3 mLs by nebulization 4 (four) times daily - after meals and at bedtime.  02/27/15  Yes Brand Males, MD  losartan-hydrochlorothiazide (HYZAAR) 100-25 MG tablet Take 0.5 tablets by mouth daily. Patient taking differently: Take 1 tablet by mouth daily.  10/29/15  Yes Orson Eva, MD  magnesium oxide (MAG-OX) 400 MG tablet Take 400 mg by mouth at bedtime.    Yes Historical Provider, MD  metFORMIN (GLUCOPHAGE) 500 MG tablet Take 500 mg by mouth daily with breakfast.  03/24/14  Yes Historical Provider, MD  Multiple Vitamins-Minerals (MULTIVITAMINS THER. W/MINERALS) TABS Take 1 tablet by mouth daily.   Yes Historical Provider, MD  NON FORMULARY Place 2 L into the nose continuous.   Yes Historical Provider, MD  Omega-3 Fatty Acids (FISH OIL) 1200 MG CAPS Take 1 capsule by mouth 2 (two) times daily.   Yes Historical Provider, MD  polyethylene glycol (MIRALAX / GLYCOLAX) packet Take 17 g by mouth 2 (two) times daily. Patient taking differently: Take 17 g by mouth daily as needed for mild constipation.  07/31/15  Yes Thurnell Lose, MD  triamcinolone cream (KENALOG) 0.1 % Apply 1 application topically 2 (two) times daily as needed ([place]).  04/18/15  Yes Historical Provider, MD    Physical Exam: Vitals:   11/25/15 1430 11/25/15 1444 11/25/15 1445 11/25/15 1638  BP: (!) 128/54 (!) 128/54 129/64 118/60  Pulse: 71 96 91 93  Resp: '21 18 17 14  '$ Temp:      TempSrc:      SpO2: 95% 99% 99% 97%      Constitutional: NAD, calm, comfortable Eyes: PERRL, lids and conjunctivae normal ENMT: Mucous membranes are moist. Posterior pharynx clear of  any exudate or lesions.Normal dentition.  Neck: normal, supple, no masses, no thyromegaly Respiratory: Diminished/no lung sounds on left-crackles right base, no wheezing. Normal respiratory effort. No accessory muscle use. 2 L oxygen Cardiovascular: Irregular rate and treat will flutter rhythm-patient heart rate increased from 70s up to low 100s just with leaning forward while seated on stretcher, no murmurs / rubs / gallops. No extremity edema. 2+ pedal  pulses. No carotid bruits.  Abdomen: no tenderness, no masses palpated. No hepatosplenomegaly. Bowel sounds positive.  Musculoskeletal: no clubbing / cyanosis. No joint deformity upper and lower extremities. Good ROM, no contractures. Normal muscle tone.  Skin: no rashes, lesions, ulcers. No induration Neurologic: CN 2-12 grossly intact except for previously documented extreme hearing loss. Sensation intact, DTR normal. Strength 5/5 x all 4 extremities.  Psychiatric: Normal judgment and insight. Alert and oriented x 3. Normal mood.    Labs on Admission: I have personally reviewed following labs and imaging studies  CBC:  Recent Labs Lab 11/25/15 1210  WBC 9.4  HGB 9.5*  HCT 32.8*  MCV 93.7  PLT 502   Basic Metabolic Panel:  Recent Labs Lab 11/25/15 1210  NA 136  K 4.4  CL 90*  CO2 41*  GLUCOSE 141*  BUN 18  CREATININE 1.21  CALCIUM 9.5   GFR: CrCl cannot be calculated (Unknown ideal weight.). Liver Function Tests: No results for input(s): AST, ALT, ALKPHOS, BILITOT, PROT, ALBUMIN in the last 168 hours. No results for input(s): LIPASE, AMYLASE in the last 168 hours. No results for input(s): AMMONIA in the last 168 hours. Coagulation Profile: No results for input(s): INR, PROTIME in the last 168 hours. Cardiac Enzymes: No results for input(s): CKTOTAL, CKMB, CKMBINDEX, TROPONINI in the last 168 hours. BNP (last 3 results) No results for input(s): PROBNP in the last 8760 hours. HbA1C: No results for input(s): HGBA1C in  the last 72 hours. CBG: No results for input(s): GLUCAP in the last 168 hours. Lipid Profile: No results for input(s): CHOL, HDL, LDLCALC, TRIG, CHOLHDL, LDLDIRECT in the last 72 hours. Thyroid Function Tests: No results for input(s): TSH, T4TOTAL, FREET4, T3FREE, THYROIDAB in the last 72 hours. Anemia Panel: No results for input(s): VITAMINB12, FOLATE, FERRITIN, TIBC, IRON, RETICCTPCT in the last 72 hours. Urine analysis:    Component Value Date/Time   COLORURINE YELLOW 10/26/2015 1031   APPEARANCEUR CLEAR 10/26/2015 1031   LABSPEC 1.024 10/26/2015 1031   PHURINE 6.5 10/26/2015 1031   GLUCOSEU NEGATIVE 10/26/2015 1031   HGBUR SMALL (A) 10/26/2015 1031   BILIRUBINUR NEGATIVE 10/26/2015 1031   KETONESUR NEGATIVE 10/26/2015 1031   PROTEINUR >300 (A) 10/26/2015 1031   UROBILINOGEN 0.2 06/02/2007 2336   NITRITE NEGATIVE 10/26/2015 1031   LEUKOCYTESUR NEGATIVE 10/26/2015 1031   Sepsis Labs: '@LABRCNTIP'$ (procalcitonin:4,lacticidven:4) )No results found for this or any previous visit (from the past 240 hour(s)).   Radiological Exams on Admission: Dg Chest 2 View  Result Date: 11/25/2015 CLINICAL DATA:  Shortness of breath for several days. EXAM: CHEST  2 VIEW COMPARISON:  10/27/2015 FINDINGS: Previous median sternotomy and CABG procedure. Postoperative appearance of the left hemi thorax compatible with previous pneumonectomy. There is a small right pleural effusion and mild interstitial edema. IMPRESSION: 1. Suspect mild CHF. 2. Left pneumonectomy. Electronically Signed   By: Kerby Moors M.D.   On: 11/25/2015 13:46    EKG: (Independently reviewed) atrial flutter with variable conduction, current ventricular rate 94 bpm, QTC 385 ms, no acute ischemic changes; essentially unchanged from prior EKG except previous EKG demonstrated sinus rhythm  Assessment/Plan Principal Problem:   Acute on chronic respiratory failure with hypoxia and hypercarbia /Acute diastolic heart failure  -Patient  presents with 3 days of progressive shortness of breath consistent with heart failure exacerbation in setting of recurrent issues of palpitations and documented tachycardia -BNP mildly elevated at 268 -ABG obtained after admission consistent with baseline chronic hypercarbia with a PCO2 of 72.7 and  a normal pH i.e. compensated (chronically elevated serum CO2)-as well as stable hypoxemia with PO2 on oxygen 69 -Continue oxygen at 2 L -Was given Lasix 80 mg in ER and will give an additional 2 doses 12 hours apart of 40 mg of Lasix and reevaluate -Daily weights, strict I/O -Echocardiogram completed last admission demonstrated preserved LV function and grade 1 diastolic dysfunction with mild to moderate pulmonary hypertension -Continue losartan but have decreased to 50 mg per day and have discontinued hydrochlorothiazide while actively diuresing -Continue carvedilol  Active Problems:   Uncontrolled atrial fibrillation/flutter -At rest patient's heart rate remains in the 70s but just with leaning forward in the bed heart rate increased to low 100s and with activity increases as high as the 130s but decreases back to more normal rate with rest -EDP spoke with Dr. Irish Lack who stated if any concerns that could not be managed by internal medicine team to please formally consult cardiology-if unable to control rate recommend a cardiology consultation-wife states patient previously had good results with DCCV -CHADVASc= 5 -Continue eliquis -Check magnesium and phosphorus level -Keep serum potassium greater than or equal to 4.0    HTN (hypertension) -Currently well-controlled on above mentioned medications    CAD (coronary artery disease), CABG 2007 -Currently asymptomatic and EKG reassuring-no indication to cycle cardiac enzymes at this juncture    Diabetes mellitus -Mildly hyperglycemic in setting of acute distress (also received a dose of Solu-Medrol in the ER) -Follow CBGs and provide SSRIs  indicated -During acute illness hold home metformin    COPD (chronic obstructive pulmonary disease) / Pulmonary hypertension / History of lung cancer/  Hx of pneumonectomy -Has not been actively wheezing so do not suspect acute exacerbation -Continue preadmission duo nebs but in setting of persistent tachycardia may benefit from transition to [place]; if needs to discharge on Xopenex likely will need case management to preauthorize    Hyperlipidemia -Continue omega-3 fatty acids    Thrombocytopenia  -Current platelets are normal at 261,000 and were 155,000 at time of discharge      DVT prophylaxis: Eliquis  Code Status: Full-wife reports she and patient need to discuss about at least being a DO NOT INTUBATE given his chronic pulmonary status Family Communication: Wife at bedside  Disposition Plan: Anticipate discharge back to preadmission home environment once medically stable Consults called: None-cardiology was curbside consulted by EDP Admission status: Observation/telemetry    ELLIS,ALLISON L. ANP-BC [facility] Pager [phone]   If 7PM-7AM, please contact night-coverage [email or web address] Password [facility]  11/25/2015, 4:56 PM

## 2015-11-25 NOTE — ED Provider Notes (Signed)
Medical screening examination/treatment/procedure(s) were conducted as a shared visit with non-physician practitioner(s) and myself.  I personally evaluated the patient during the encounter.   EKG Interpretation  Date/Time:  Saturday November 25 2015 12:00:05 EDT Ventricular Rate:  94 PR Interval:    QRS Duration: 72 QT Interval:  308 QTC Calculation: 385 R Axis:   71 Text Interpretation:  Atrial flutter with variable A-V block Nonspecific ST abnormality Abnormal QRS-T angle, consider primary T wave abnormality Abnormal ECG Confirmed by Ginnette Gates  MD, Mica Releford [phone]) on 11/25/2015 2:23:44 PM      Results for orders placed or performed during the hospital encounter of 96/78/93  Basic metabolic panel  Result Value Ref Range   Sodium 136 135 - 145 mmol/L   Potassium 4.4 3.5 - 5.1 mmol/L   Chloride 90 (L) 101 - 111 mmol/L   CO2 41 (H) 22 - 32 mmol/L   Glucose, Bld 141 (H) 65 - 99 mg/dL   BUN 18 6 - 20 mg/dL   Creatinine, Ser 1.21 0.61 - 1.24 mg/dL   Calcium 9.5 8.9 - 10.3 mg/dL   GFR calc non Af Amer 55 (L) >60 mL/min   GFR calc Af Amer >60 >60 mL/min   Anion gap 5 5 - 15  CBC  Result Value Ref Range   WBC 9.4 4.0 - 10.5 K/uL   RBC 3.50 (L) 4.22 - 5.81 MIL/uL   Hemoglobin 9.5 (L) 13.0 - 17.0 g/dL   HCT 32.8 (L) 39.0 - 52.0 %   MCV 93.7 78.0 - 100.0 fL   MCH 27.1 26.0 - 34.0 pg   MCHC 29.0 (L) 30.0 - 36.0 g/dL   RDW 17.1 (H) 11.5 - 15.5 %   Platelets 261 150 - 400 K/uL  I-stat troponin, ED  Result Value Ref Range   Troponin i, poc 0.03 0.00 - 0.08 ng/mL   Comment 3           Dg Chest 2 View  Result Date: 11/25/2015 CLINICAL DATA:  Shortness of breath for several days. EXAM: CHEST  2 VIEW COMPARISON:  10/27/2015 FINDINGS: Previous median sternotomy and CABG procedure. Postoperative appearance of the left hemi thorax compatible with previous pneumonectomy. There is a small right pleural effusion and mild interstitial edema. IMPRESSION: 1. Suspect mild CHF. 2. Left pneumonectomy.  Electronically Signed   By: Kerby Moors M.D.   On: 11/25/2015 13:46   Ct Head Wo Contrast  Result Date: 10/26/2015 CLINICAL DATA:  82 year old male inpatient with reported history of encephalopathy, headache and dizziness. EXAM: CT HEAD WITHOUT CONTRAST TECHNIQUE: Contiguous axial images were obtained from the base of the skull through the vertex without intravenous contrast. COMPARISON:  09/02/2013 head CT. FINDINGS: No evidence of parenchymal hemorrhage or extra-axial fluid collection. No mass lesion, mass effect, or midline shift. No CT evidence of acute infarction. Intracranial atherosclerosis. Nonspecific mild subcortical and periventricular white matter hypodensity, most in keeping with chronic small vessel ischemic change. Cerebral volume is age appropriate. No ventriculomegaly. The visualized paranasal sinuses are essentially clear. The mastoid air cells are unopacified. No evidence of calvarial fracture. IMPRESSION: 1.  No evidence of acute intracranial abnormality. 2. Mild chronic small vessel ischemia. Electronically Signed   By: Ilona Sorrel M.D.   On: 10/26/2015 17:20   Ct Angio Chest Pe W Or Wo Contrast  Addendum Date: 10/26/2015   ADDENDUM REPORT: 10/26/2015 17:58 ADDENDUM: Large right thyroid nodule which has been present since 2011. This could be more definitively characterized with ultrasound. Electronically Signed  By: Markus Daft M.D.   On: 10/26/2015 17:58  Result Date: 10/26/2015 CLINICAL DATA:  82 year old with shortness of breath. History of lung cancer and left pneumonectomy. EXAM: CT ANGIOGRAPHY CHEST WITH CONTRAST TECHNIQUE: Multidetector CT imaging of the chest was performed using the standard protocol during bolus administration of intravenous contrast. Multiplanar CT image reconstructions and MIPs were obtained to evaluate the vascular anatomy. CONTRAST:  100 mL Isovue 370 COMPARISON:  Chest CTA 01/19/2010 FINDINGS: Cardiovascular: No evidence for a pulmonary embolism. Prior  ligation of the left main pulmonary artery due to left pneumonectomy. Coronary arteries and thoracic aorta are heavily calcified. No evidence to suggest an aortic dissection. Extensive plaque involving the descending thoracic aorta. At least mild stenosis at the origin of celiac trunk. SMA is patent. High-grade stenosis of the left renal artery with near occlusion. There is extensive plaque involving the proximal right renal artery with at least moderate stenosis. Mediastinum/Nodes: Post left pneumonectomy changes with extensive left pleural calcifications and slightly dense fluid in the left hemithorax. These findings are similar to the prior examination. 2.8 cm low-density nodule in the right thyroid lobe is similar to the exam in 2011. No suspicious mediastinal or hilar lymphadenopathy. Mild fullness in the subcarinal region is thought to be associated with the esophagus. No suspicious axillary lymphadenopathy. Lungs/Pleura: Emphysematous changes in the right upper lung. There is stable right apical scarring. Dependent densities in the right lower lobe could represent atelectasis. No significant airspace disease or consolidation. Upper Abdomen: Mild to moderate distention of the visualized gallbladder. Diffuse thickening of the left adrenal gland is similar to the previous examination and probably represents hyperplasia. Similarly, there is probably hyperplasia in the right adrenal gland. Left kidney is atrophic. No acute abnormality in the upper abdomen. Musculoskeletal: Prior median sternotomy. No acute bone abnormality. Review of the MIP images confirms the above findings. IMPRESSION: Negative for pulmonary embolism. Stable postsurgical changes associated with the left pneumonectomy. Dependent densities in the right lung probably represent atelectasis. No other significant right lung disease. Extensive atherosclerotic disease involving the aorta and visceral arteries. Bilateral renal artery stenosis. Near  occlusion of the left renal artery with left renal atrophy. Electronically Signed: By: Markus Daft M.D. On: 10/26/2015 17:34   Mr Brain Wo Contrast  Result Date: 10/27/2015 CLINICAL DATA:  Altered mental status and headache. Extremity weakness. EXAM: MRI HEAD WITHOUT CONTRAST MRI CERVICAL SPINE WITHOUT CONTRAST TECHNIQUE: Multiplanar, multiecho pulse sequences of the brain and surrounding structures, and cervical spine, to include the craniocervical junction and cervicothoracic junction, were obtained without intravenous contrast. COMPARISON:  CT 10/26/2015.  MRI 05/18/2011 FINDINGS: MRI HEAD FINDINGS Image quality degraded by mild motion. Generalized atrophy.  Negative for hydrocephalus Negative for acute infarct. Mild chronic microvascular ischemic changes in the white matter. 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 Brainstem and cerebellum intact. Negative for intracranial hemorrhage. No mass or edema. No shift of the midline structures Paranasal sinuses clear. No orbital lesion. Pituitary not enlarged. MRI CERVICAL SPINE FINDINGS Alignment: Normal alignment. Reversal of the cervical lordosis with mild kyphosis. Vertebrae: Negative for fracture or mass.  Normal bone marrow. Cord: No cord lesion identified. Motion degraded images with limited evaluation of the cord. Posterior Fossa, vertebral arteries, paraspinal tissues: Negative Disc levels: C1-2: Degenerative changes C1-C2 without significant stenosis. C2-3: Mild disc and facet degeneration. No significant spinal or foraminal stenosis C3-4: Moderate disc degeneration and spondylosis with diffuse uncinate spurring. Bilateral facet hypertrophy. Mild spinal stenosis and mild foraminal stenosis bilaterally C4-5:  Disc degeneration and mild to moderate spondylosis left greater than right. Bilateral facet hypertrophy. Moderate left foraminal encroachment and mild right foraminal encroachment. Mild spinal stenosis. C5-6: Disc degeneration and moderate  spondylosis. Mild spinal stenosis and moderate foraminal encroachment bilaterally. Bilateral facet degeneration C6-7: Disc degeneration and spondylosis with diffuse uncinate spurring and bilateral facet degeneration. Mild spinal stenosis and moderate foraminal encroachment bilaterally C7-T1:  Mild facet degeneration. IMPRESSION: Atrophy and mild chronic ischemic change. No acute intracranial abnormality Cervical spondylosis. Multilevel disc and facet degeneration and spurring causing spinal and foraminal encroachment at multiple levels as described above. Electronically Signed   By: Franchot Gallo M.D.   On: 10/27/2015 11:56   Mr Cervical Spine Wo Contrast  Result Date: 10/27/2015 CLINICAL DATA:  Altered mental status and headache. Extremity weakness. EXAM: MRI HEAD WITHOUT CONTRAST MRI CERVICAL SPINE WITHOUT CONTRAST TECHNIQUE: Multiplanar, multiecho pulse sequences of the brain and surrounding structures, and cervical spine, to include the craniocervical junction and cervicothoracic junction, were obtained without intravenous contrast. COMPARISON:  CT 10/26/2015.  MRI 05/18/2011 FINDINGS: MRI HEAD FINDINGS Image quality degraded by mild motion. Generalized atrophy.  Negative for hydrocephalus Negative for acute infarct. Mild chronic microvascular ischemic changes in the white matter. 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 Brainstem and cerebellum intact. Negative for intracranial hemorrhage. No mass or edema. No shift of the midline structures Paranasal sinuses clear. No orbital lesion. Pituitary not enlarged. MRI CERVICAL SPINE FINDINGS Alignment: Normal alignment. Reversal of the cervical lordosis with mild kyphosis. Vertebrae: Negative for fracture or mass.  Normal bone marrow. Cord: No cord lesion identified. Motion degraded images with limited evaluation of the cord. Posterior Fossa, vertebral arteries, paraspinal tissues: Negative Disc levels: C1-2: Degenerative changes C1-C2 without  significant stenosis. C2-3: Mild disc and facet degeneration. No significant spinal or foraminal stenosis C3-4: Moderate disc degeneration and spondylosis with diffuse uncinate spurring. Bilateral facet hypertrophy. Mild spinal stenosis and mild foraminal stenosis bilaterally C4-5: Disc degeneration and mild to moderate spondylosis left greater than right. Bilateral facet hypertrophy. Moderate left foraminal encroachment and mild right foraminal encroachment. Mild spinal stenosis. C5-6: Disc degeneration and moderate spondylosis. Mild spinal stenosis and moderate foraminal encroachment bilaterally. Bilateral facet degeneration C6-7: Disc degeneration and spondylosis with diffuse uncinate spurring and bilateral facet degeneration. Mild spinal stenosis and moderate foraminal encroachment bilaterally C7-T1:  Mild facet degeneration. IMPRESSION: Atrophy and mild chronic ischemic change. No acute intracranial abnormality Cervical spondylosis. Multilevel disc and facet degeneration and spurring causing spinal and foraminal encroachment at multiple levels as described above. Electronically Signed   By: Franchot Gallo M.D.   On: 10/27/2015 11:56   Dg Chest Port 1 View  Result Date: 10/27/2015 CLINICAL DATA:  Respiratory failure. EXAM: PORTABLE CHEST 1 VIEW COMPARISON:  10/26/2015.  CT 10/26/2015. FINDINGS: Left pneumonectomy. Dense pleural calcifications again noted. Mild right base subsegmental atelectasis. Prior median sternotomy. No pneumothorax. IMPRESSION: 1. Stable changes of left pneumonectomy with dense pleural calcification. 2. Mild right base subsegmental atelectasis. 3. Prior CABG.  Chest is stable from prior exams. Electronically Signed   By: Marcello Moores  Register   On: 10/27/2015 06:59   Patient seen by me. Patient has a history of COPD his history of coronary artery disease. Question history of CHF. Patient status post bypass surgery 7 years ago was also had his left lung removed. X-rays raise some question of  maybe some mild CHF. Patient presents mostly for shortness of breath. Predominantly exertional associated with chest pressure also says sometimes has chest pressure at  night. Patient at rest shows atrial flutter that is rate controlled. Patient is on blood thinners. Patient is followed by Dr.Varanashy  from cardiology. Also followed by [facility] for primary care.   Patient with this constellation of symptoms could be representing unstable angina. May have some mild congestive heart failure. Will be given 80 of Lasix here. Recommend medical admission also contact his cardiologist so that they are aware. Patient needs chest pain rule out as well as further evaluation of the COPD. On 2 L of oxygen he is satting I in the upper 90s.  On exam heart is irregular lungs are clear bilaterally. Can even hear breath sounds on the left side. No leg swelling.    Fredia Sorrow, MD 11/25/15 [phone]

## 2015-11-25 NOTE — ED Provider Notes (Signed)
[place] DEPT Provider Note   CSN: [id number] Arrival date & time: 11/25/15  1155  First Provider Contact:  None       History   Chief Complaint Chief Complaint  Patient presents with  . Tachycardia  . Shortness of Breath  . Atrial Flutter    HPI   DARRAL RISHEL is a 82 y.o. male with past medical history of lung cancer s/p Left pneumonectomy in 2000, COPD (on 1.5 L at all times), CAD status post CABG 10 years ago, atrial fibrillation, DM, hypertension, hyperlipidemia complaining of shortness of breath, dyspnea on exertion and left-sided pressure-like chest pain onset 3 days ago associated with tachycardia worsened by activity and standing, as per his wife his heart rate has gone up 140 when he is active. Has been compliant with his diltiazem and Eliquis. Was instructed by primary care physician that wife can double his dose of diltiazem exam his heart rate stays above 120 for an hour but it is not stated above that level for that long.  PCP: Harrington Challenger Cardiology: Murtis Sink  HPI  Past Medical History:  Diagnosis Date  . Arthritis   . Atrial flutter with rapid ventricular response ([place])    a. 2007 s/p DCCV following CV surgery;  b. 10/2014->CHA2DS2VASc = 5-->Xarelto.  . 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  Marland Kitchen CAD (coronary artery disease)    a. 06/2005 s/p CABG x1 (VG->RCA) @ time of myxoma resection.  . Carotid artery disease ([place])    a. 06/2005 s/p R CEA;  b. 10/2013 Carotid U/S: patent RICA, LICA 51-76%;  c. 04/6071 U/S: RICA 71-06%, LICA 26-94%.  Marland Kitchen COPD (chronic obstructive pulmonary disease) ([place])   . Daily headache   . Depression   . Essential hypertension   . GERD (gastroesophageal reflux disease)   . GI bleed 07/2015  . Hearing loss   . History of pneumonia   . Hypertriglyceridemia   . On home oxygen therapy    "2L; 24/7" (11/03/2014)  . Scarlet fever    a. ~ 1942.  Marland Kitchen Squamous cell lung cancer ([place])    a. s/p L pneumonectomy in 2000.  . Stroke ([place])    . Type II diabetes mellitus [facility])     Patient Active Problem List   Diagnosis Date Noted  . Encephalopathy   . Acute hyponatremia 10/26/2015  . Chest pain, atypical 10/26/2015  . Headache 10/26/2015  . Metabolic encephalopathy 85/46/2703  . Proteinuria 10/26/2015  . Thrombocytopenia ([place]) 10/26/2015  . Acute on chronic respiratory failure with hypoxia and hypercarbia ([place]) 10/26/2015  . Acute respiratory failure with hypercapnia ([place]) 10/26/2015  . Chronic respiratory failure with hypoxia ([place]) 08/28/2015  . UGI bleed 07/25/2015  . Sinus drainage 02/27/2015  . Hyperlipidemia 01/11/2015  . Accelerated hypertension   . Chronic anticoagulation-Xarelto prescribed 10/27/14 11/04/2014  . Hypercapnic respiratory failure, chronic ([place]) 03/21/2014  . Arteriosclerosis of coronary artery 01/10/2014  . Atrial fibrillation ([place]) 01/10/2014  . Aftercare following surgery of the circulatory system, [place] 11/09/2013  . COPD (chronic obstructive pulmonary disease) ([place]) 09/03/2013  . Hx of pneumonectomy 09/03/2013  . Movement disorder 09/02/2013  . Acute-on-chronic respiratory failure ([place]) 09/02/2013  . Occlusion and stenosis of carotid artery without mention of cerebral infarction 11/07/2011  . Numbness and tingling of left arm and leg 05/17/2011  . Diabetes mellitus ([place]) 05/17/2011  . HTN (hypertension)   . History of lung cancer   . Hypertriglyceridemia   . CAD (coronary artery disease),  CABG 2007   . Atrial myxoma, resection 2007   . Carotid artery disease ([place])   . Hearing loss     Past Surgical History:  Procedure Laterality Date  . CARDIAC CATHETERIZATION  06/2005; 01/2010   Archie Endo 6/16/2012Marland Kitchen Archie Endo 02/02/2010  . CARDIOVERSION  07/2005   DCC/notes 09/04/2010  . CAROTID ENDARTERECTOMY Right 2007  . COLONOSCOPY N/A 07/27/2015   Procedure: COLONOSCOPY;  Surgeon: Wonda Horner, MD;  Location: [facility] ENDOSCOPY;  Service: Endoscopy;  Laterality: N/A;  . COMBINED MEDIASTINOSCOPY AND  BRONCHOSCOPY  05/01/1998   Dr Servando Snare  . CORONARY ARTERY BYPASS GRAFT  07/12/2005   Dr Servando Snare, CABG x 1 with reverse saphenous vein graft to distal rt coronary artery and excision of left atrial myxoma [Other]  . ESOPHAGOGASTRODUODENOSCOPY (EGD) WITH PROPOFOL N/A 07/26/2015   Procedure: ESOPHAGOGASTRODUODENOSCOPY (EGD) WITH PROPOFOL;  Surgeon: Wonda Horner, MD;  Location: [facility] ENDOSCOPY;  Service: Endoscopy;  Laterality: N/A;  . PNEUMONECTOMY Left 05/03/1998   Dr Servando Snare  . RENAL ANGIOGRAM Bilateral 12/2005   Archie Endo 09/04/2010  . TRANSESOPHAGEAL ECHOCARDIOGRAM  06/2005   Archie Endo 09/04/2010       Home Medications    Prior to Admission medications   Medication Sig Start Date End Date Taking? Authorizing Provider  acetaminophen (TYLENOL) 325 MG tablet Take 325 mg by mouth every 6 (six) hours as needed for mild pain, moderate pain or headache. For pain    Historical Provider, MD  albuterol (PROVENTIL HFA;VENTOLIN HFA) 108 (90 BASE) MCG/ACT inhaler Inhale 2 puffs into the lungs every 6 (six) hours as needed for wheezing.    Historical Provider, MD  apixaban (ELIQUIS) 5 MG TABS tablet Take 1 tablet (5 mg total) by mouth 2 (two) times daily. 07/31/15   Thurnell Lose, MD  carvedilol (COREG) 12.5 MG tablet Take 1 tablet (12.5 mg total) by mouth 2 (two) times daily. 11/20/15   Jettie Booze, MD  cetirizine (ZYRTEC) 10 MG tablet Take 10 mg by mouth daily as needed for allergies or rhinitis. For allergies    Historical Provider, MD  cholecalciferol (VITAMIN D) 1000 UNITS tablet Take 2,000 Units by mouth 2 (two) times daily.     Historical Provider, MD  diltiazem (CARDIZEM CD) 180 MG 24 hr capsule Take 1 capsule (180 mg total) by mouth at bedtime. 10/18/15   Burtis Junes, NP  docusate sodium (COLACE) 100 MG capsule Take 2 capsules (200 mg total) by mouth daily. 07/31/15   Thurnell Lose, MD  EPIPEN 2-PAK 0.3 MG/0.3ML DEVI Inject 0.3 mg into the skin See admin instructions. Take as needed for  severe allergic reaction 11/04/11   Historical Provider, MD  ipratropium-albuterol (DUONEB) 0.5-2.5 (3) MG/3ML SOLN Take 3 mLs by nebulization 3 (three) times daily. Patient taking differently: Take 3 mLs by nebulization 4 (four) times daily - after meals and at bedtime.  02/27/15   Brand Males, MD  losartan-hydrochlorothiazide (HYZAAR) 100-25 MG tablet Take 0.5 tablets by mouth daily. 10/29/15   Orson Eva, MD  magnesium oxide (MAG-OX) 400 MG tablet Take 400 mg by mouth at bedtime.     Historical Provider, MD  metFORMIN (GLUCOPHAGE) 500 MG tablet Take 500 mg by mouth daily with breakfast.  03/24/14   Historical Provider, MD  Multiple Vitamins-Minerals (MULTIVITAMINS THER. W/MINERALS) TABS Take 1 tablet by mouth daily.    Historical Provider, MD  NON FORMULARY Place 2 L into the nose continuous.    Historical Provider, MD  Omega-3 Fatty Acids (FISH OIL) 1200 MG  CAPS Take 1 capsule by mouth 2 (two) times daily.    Historical Provider, MD  polyethylene glycol (MIRALAX / GLYCOLAX) packet Take 17 g by mouth 2 (two) times daily. Patient taking differently: Take 17 g by mouth daily.  07/31/15   Thurnell Lose, MD  predniSONE (DELTASONE) 10 MG tablet Take 5 tablets (50 mg total) by mouth daily with breakfast. And decrease by 1 tablet daily 10/30/15   Orson Eva, MD  triamcinolone cream (KENALOG) 0.1 % Apply 1 application topically 2 (two) times daily as needed ([place]).  04/18/15   Historical Provider, MD    Family History Family History  Problem Relation Age of Onset  . Heart disease Mother     NOT before age 9  . Heart attack Mother   . Diabetes Mother   . Hyperlipidemia Mother   . Heart disease Sister   . Cancer Sister     Breast  . Hypertension Sister   . Stomach cancer Father   . Cancer Father     Stomach  . Hyperlipidemia Father   . Hyperlipidemia Brother   . Heart disease Brother     Social History Social History  Substance Use Topics  . Smoking status: Former Smoker     Packs/day: 2.00    Years: 43.00    Types: Cigarettes    Quit date: 07/21/1997  . Smokeless tobacco: Never Used  . Alcohol use 0.0 oz/week     Comment: 11/03/2014 "never drank much; stopped in the early 1970's"     Allergies   Bee venom; Sulfa antibiotics; Amoxicillin; and Statins   Review of Systems Review of Systems  10 systems reviewed and found to be negative, except as noted in the HPI.   Physical Exam Updated Vital Signs BP (!) 128/54 (BP Location: Right Arm)   Pulse 96   Temp 98.8 F (37.1 C) (Oral)   Resp 18   SpO2 99%   Physical Exam  Constitutional: He is oriented to person, place, and time. He appears well-developed and well-nourished. No distress.  HENT:  Head: Normocephalic and atraumatic.  Mouth/Throat: Oropharynx is clear and moist.  Eyes: Conjunctivae and EOM are normal. Pupils are equal, round, and reactive to light.  Neck: Normal range of motion.  Cardiovascular: Normal rate, regular rhythm and intact distal pulses.   Pulmonary/Chest: Effort normal and breath sounds normal.  Abdominal: Soft. There is no tenderness.  Musculoskeletal: Normal range of motion. He exhibits edema.  1+ pitting edema bilaterally  Neurological: He is alert and oriented to person, place, and time.  Skin: He is not diaphoretic.  Psychiatric: He has a normal mood and affect.  Nursing note and vitals reviewed.    ED Treatments / Results  Labs (all labs ordered are listed, but only abnormal results are displayed) Labs Reviewed  BASIC METABOLIC PANEL - Abnormal; Notable for the following:       Result Value   Chloride 90 (*)    CO2 41 (*)    Glucose, Bld 141 (*)    GFR calc non Af Amer 55 (*)    All other components within normal limits  CBC - Abnormal; Notable for the following:    RBC 3.50 (*)    Hemoglobin 9.5 (*)    HCT 32.8 (*)    MCHC 29.0 (*)    RDW 17.1 (*)    All other components within normal limits    EKG  EKG Interpretation None       Radiology No  results found.  Procedures Procedures (including critical care time)  Medications Ordered in ED Medications - No data to display   Initial Impression / Assessment and Plan / ED Course  I have reviewed the triage vital signs and the nursing notes.  Pertinent labs & imaging results that were available during my care of the patient were reviewed by me and considered in my medical decision making (see chart for details).  Clinical Course    Vitals:   11/25/15 1352 11/25/15 1444  BP: 144/63 (!) 128/54  Pulse: 87 96  Resp: 18 18  Temp: 98.8 F (37.1 C)   TempSrc: Oral   SpO2: 99% 99%    Medications  aspirin chewable tablet 324 mg (not administered)  furosemide (LASIX) injection 80 mg (not administered)  ipratropium-albuterol (DUONEB) 0.5-2.5 (3) MG/3ML nebulizer solution 3 mL (not administered)  methylPREDNISolone sodium succinate (SOLU-MEDROL) 125 mg/2 mL injection 125 mg (125 mg Intravenous Given 11/25/15 1500)    ANNA LIVERS is 82 y.o. male presenting with Exertional chest pain and shortness of breath onset 3 days ago. EKG shows atrial flutter which he has had in the past, heart rate is not tachycardic in the ED however his wife states that whenever he is active the heart rate goes up to 140. Troponin negative, blood work at baseline. Lung sounds clear, does not appear to be volume overloaded, likely multifactorial with mild CHF, COPD and atrial flutter. Case discussed with Dr. Irish Lack who agrees with planned admission to medicine, recommends that if cardiology consult as needed: Heart care becomes involved.  Case discussed with [facility] hospitalist NP Ellis,who accepts admission   Final Clinical Impressions(s) / ED Diagnoses   Final diagnoses:  None    New Prescriptions New Prescriptions   No medications on file     Monico Blitz, PA-C 11/25/15 1527

## 2015-11-25 NOTE — ED Notes (Signed)
Attempted report 

## 2015-11-26 ENCOUNTER — Observation Stay (HOSPITAL_COMMUNITY): Payer: Medicare Other

## 2015-11-26 DIAGNOSIS — Z85118 Personal history of other malignant neoplasm of bronchus and lung: Secondary | ICD-10-CM | POA: Diagnosis not present

## 2015-11-26 DIAGNOSIS — J441 Chronic obstructive pulmonary disease with (acute) exacerbation: Secondary | ICD-10-CM | POA: Diagnosis present

## 2015-11-26 DIAGNOSIS — I25119 Atherosclerotic heart disease of native coronary artery with unspecified angina pectoris: Secondary | ICD-10-CM | POA: Diagnosis not present

## 2015-11-26 DIAGNOSIS — Z9981 Dependence on supplemental oxygen: Secondary | ICD-10-CM | POA: Diagnosis not present

## 2015-11-26 DIAGNOSIS — D696 Thrombocytopenia, unspecified: Secondary | ICD-10-CM | POA: Diagnosis present

## 2015-11-26 DIAGNOSIS — R0602 Shortness of breath: Secondary | ICD-10-CM | POA: Diagnosis present

## 2015-11-26 DIAGNOSIS — R51 Headache: Secondary | ICD-10-CM | POA: Diagnosis present

## 2015-11-26 DIAGNOSIS — I483 Typical atrial flutter: Secondary | ICD-10-CM | POA: Diagnosis not present

## 2015-11-26 DIAGNOSIS — J9622 Acute and chronic respiratory failure with hypercapnia: Secondary | ICD-10-CM | POA: Diagnosis present

## 2015-11-26 DIAGNOSIS — I272 Other secondary pulmonary hypertension: Secondary | ICD-10-CM | POA: Diagnosis present

## 2015-11-26 DIAGNOSIS — Z7984 Long term (current) use of oral hypoglycemic drugs: Secondary | ICD-10-CM | POA: Diagnosis not present

## 2015-11-26 DIAGNOSIS — J9601 Acute respiratory failure with hypoxia: Secondary | ICD-10-CM

## 2015-11-26 DIAGNOSIS — I701 Atherosclerosis of renal artery: Secondary | ICD-10-CM | POA: Diagnosis present

## 2015-11-26 DIAGNOSIS — R079 Chest pain, unspecified: Secondary | ICD-10-CM | POA: Diagnosis not present

## 2015-11-26 DIAGNOSIS — E785 Hyperlipidemia, unspecified: Secondary | ICD-10-CM | POA: Diagnosis present

## 2015-11-26 DIAGNOSIS — J9602 Acute respiratory failure with hypercapnia: Secondary | ICD-10-CM

## 2015-11-26 DIAGNOSIS — Z7952 Long term (current) use of systemic steroids: Secondary | ICD-10-CM | POA: Diagnosis not present

## 2015-11-26 DIAGNOSIS — I4892 Unspecified atrial flutter: Secondary | ICD-10-CM | POA: Diagnosis present

## 2015-11-26 DIAGNOSIS — H9190 Unspecified hearing loss, unspecified ear: Secondary | ICD-10-CM | POA: Diagnosis present

## 2015-11-26 DIAGNOSIS — I5033 Acute on chronic diastolic (congestive) heart failure: Secondary | ICD-10-CM | POA: Diagnosis present

## 2015-11-26 DIAGNOSIS — I110 Hypertensive heart disease with heart failure: Secondary | ICD-10-CM | POA: Diagnosis present

## 2015-11-26 DIAGNOSIS — Z951 Presence of aortocoronary bypass graft: Secondary | ICD-10-CM | POA: Diagnosis not present

## 2015-11-26 DIAGNOSIS — K5901 Slow transit constipation: Secondary | ICD-10-CM | POA: Diagnosis not present

## 2015-11-26 DIAGNOSIS — I5031 Acute diastolic (congestive) heart failure: Secondary | ICD-10-CM | POA: Diagnosis not present

## 2015-11-26 DIAGNOSIS — J9600 Acute respiratory failure, unspecified whether with hypoxia or hypercapnia: Secondary | ICD-10-CM

## 2015-11-26 DIAGNOSIS — R269 Unspecified abnormalities of gait and mobility: Secondary | ICD-10-CM | POA: Diagnosis present

## 2015-11-26 DIAGNOSIS — J9621 Acute and chronic respiratory failure with hypoxia: Secondary | ICD-10-CM | POA: Diagnosis present

## 2015-11-26 DIAGNOSIS — K219 Gastro-esophageal reflux disease without esophagitis: Secondary | ICD-10-CM | POA: Diagnosis present

## 2015-11-26 DIAGNOSIS — I480 Paroxysmal atrial fibrillation: Secondary | ICD-10-CM | POA: Diagnosis present

## 2015-11-26 DIAGNOSIS — I2510 Atherosclerotic heart disease of native coronary artery without angina pectoris: Secondary | ICD-10-CM | POA: Diagnosis present

## 2015-11-26 DIAGNOSIS — Z515 Encounter for palliative care: Secondary | ICD-10-CM | POA: Diagnosis not present

## 2015-11-26 DIAGNOSIS — E1165 Type 2 diabetes mellitus with hyperglycemia: Secondary | ICD-10-CM | POA: Diagnosis present

## 2015-11-26 DIAGNOSIS — Z66 Do not resuscitate: Secondary | ICD-10-CM | POA: Diagnosis present

## 2015-11-26 LAB — GLUCOSE, CAPILLARY
GLUCOSE-CAPILLARY: 171 mg/dL — AB (ref 65–99)
GLUCOSE-CAPILLARY: 183 mg/dL — AB (ref 65–99)
Glucose-Capillary: 135 mg/dL — ABNORMAL HIGH (ref 65–99)
Glucose-Capillary: 132 mg/dL — ABNORMAL HIGH (ref 65–99)

## 2015-11-26 LAB — BASIC METABOLIC PANEL
ANION GAP: 10 (ref 5–15)
BUN: 26 mg/dL — ABNORMAL HIGH (ref 6–20)
CALCIUM: 9.7 mg/dL (ref 8.9–10.3)
CO2: 45 mmol/L — ABNORMAL HIGH (ref 22–32)
CREATININE: 1.39 mg/dL — AB (ref 0.61–1.24)
Chloride: 83 mmol/L — ABNORMAL LOW (ref 101–111)
GFR, EST AFRICAN AMERICAN: 54 mL/min — AB (ref >60)
GFR, EST NON AFRICAN AMERICAN: 46 mL/min — AB (ref >60)
Glucose, Bld: 159 mg/dL — ABNORMAL HIGH (ref 65–99)
Potassium: 3.8 mmol/L (ref 3.5–5.1)
SODIUM: 138 mmol/L (ref 135–145)

## 2015-11-26 MED ORDER — PREDNISONE 20 MG PO TABS
40.0000 mg | ORAL_TABLET | Freq: Two times a day (BID) | ORAL | Status: AC
  Administered 2015-11-27 (×2): 40 mg via ORAL
  Filled 2015-11-26 (×3): qty 2

## 2015-11-26 MED ORDER — PREDNISONE 50 MG PO TABS
50.0000 mg | ORAL_TABLET | Freq: Two times a day (BID) | ORAL | Status: DC
Start: 2015-11-26 — End: 2015-11-26

## 2015-11-26 MED ORDER — PREDNISONE 50 MG PO TABS
50.0000 mg | ORAL_TABLET | Freq: Two times a day (BID) | ORAL | Status: AC
  Administered 2015-11-26 (×2): 50 mg via ORAL
  Filled 2015-11-26 (×2): qty 1

## 2015-11-26 MED ORDER — PREDNISONE 50 MG PO TABS
50.0000 mg | ORAL_TABLET | Freq: Every day | ORAL | Status: DC
  Administered 2015-11-28: 50 mg via ORAL
  Filled 2015-11-26: qty 1

## 2015-11-26 MED ORDER — IPRATROPIUM-ALBUTEROL 0.5-2.5 (3) MG/3ML IN SOLN: 3.0000 mL | Freq: Four times a day (QID) | RESPIRATORY_TRACT | Status: DC | PRN

## 2015-11-26 MED ORDER — PREDNISONE 20 MG PO TABS: 40.0000 mg | ORAL_TABLET | Freq: Two times a day (BID) | ORAL | Status: DC

## 2015-11-26 MED ORDER — POLYETHYLENE GLYCOL 3350 17 G PO PACK
17.0000 g | PACK | Freq: Every day | ORAL | Status: DC | PRN
  Administered 2015-11-26: 17 g via ORAL
  Filled 2015-11-26: qty 1

## 2015-11-26 MED ORDER — IPRATROPIUM-ALBUTEROL 0.5-2.5 (3) MG/3ML IN SOLN
3.0000 mL | Freq: Three times a day (TID) | RESPIRATORY_TRACT | Status: DC
  Administered 2015-11-26 – 2015-11-29 (×10): 3 mL via RESPIRATORY_TRACT
  Filled 2015-11-26 (×10): qty 3

## 2015-11-26 MED ORDER — FUROSEMIDE 40 MG PO TABS
40.0000 mg | ORAL_TABLET | Freq: Every day | ORAL | Status: DC
  Administered 2015-11-27 – 2015-11-29 (×3): 40 mg via ORAL
  Filled 2015-11-26 (×3): qty 1

## 2015-11-26 MED ORDER — LOSARTAN POTASSIUM 25 MG PO TABS
25.0000 mg | ORAL_TABLET | Freq: Every day | ORAL | Status: DC
  Administered 2015-11-26 – 2015-11-29 (×4): 25 mg via ORAL
  Filled 2015-11-26 (×4): qty 1

## 2015-11-26 NOTE — Progress Notes (Addendum)
PROGRESS NOTE  Calvin [name]:[id number] DOB: 02/11/1933 DOA: 11/25/2015 PCP:  [name], MD  Brief History:  82 y/o male with history of lung cancer s/p Left pneumonectomy in 2000, COPD, CAD status post CABG 10 years ago, atrial fibrillation, chronic respiratory failure on 1.5 L, diabetes mellitus, hypertension, hyperlipidemia presented with two-week history of increasing shortness of breath. The patient was recently discharged from the hospital after a stay from 10/26/2015 through 10/28/2015 for acute on chronic respiratory failure secondary to COPD exacerbation and acute metabolic encephalopathy. He required BiPAP during that admission and was discharged home on 2 L nasal cannula. In the past week, the patient has noted increasing palpitations particularly with activity. Apparently his home health RN noted his heart rate up to the 130s, but improves with rest. On 11/22/2015 the patient began having increasing shortness of breath without improvement despite using his nebulizer treatments. Apparently the patient had a weight of 207 and his primary care provider's office this past week. His discharge weight on 10/28/2015 was 201 pounds. Workup in the emergency department reveal a chest x-ray that showed vascular congestion. The patient was given furosemide 80 mg IV 1 and Solu-Medrol 125 mg IV 1.  Medical records reviewed and summarized.  Assessment/Plan: Acute on chronic respiratory failure with hypoxia and hypercarbia -Although pulmonary edema/CHF may be playing a role in his shortness of breath I feel this is likely multifactorial including his A. fib with RVR and likely COPD exacerbation -His admission weight is less than his discharge weight 1 month ago -Presently stable on 2 L nasal cannula--93-94 percent saturation -consult palliative medicine for GOC--Dr. Titus Mould previously stated pt would not do well if had to be intubated  Acute on chronic diastolic CHF -R>L heart failure -NEG  2.2 L since admission. -10/28/2015 discharge weight 201 pounds -11/25/2015 admission weight 199 pounds -Continue intravenous furosemide -Monitor renal function -Daily weights -10/27/2015 echocardiogram EF 60-65%, grade 1 DD, PAP 44  COPD exacerbation -Start by mouth prednisone -Continue bronchodilators  Paroxysmal atrial fibrillation/flutter -Rate controlled -Continue carvedilol and diltiazem -Continue apixaban -10/26/2015 TSH  0.800  Hypertension -Controlled -Continue carvedilol and losartan  Coronary artery disease -No anginal symptoms -personally reviewed EKG--atrial flutter, nonspecific ST changes -Continue aspirin  Diabetes mellitus type 2 -CBG is elevated secondary to steroids -Discontinue metformin -10/26/2015: A1c 6.2 -Allow for liberal glycemic control  Renal artery stenosis  -This has likely been a chronic issue  -Follow up with [facility] in outpt setting -this is contributing to his labile HTN  Hyperlipidemia  -Continue statin   Gait instability and headache -MRI brain--neg for acute findings  History lung cancer status post left pneumonectomy    Disposition Plan:   Home in 2-3 days  Family Communication:   No Family at bedside--Total time spent 35 minutes.  Greater than 50% spent face to face counseling and coordinating care.   Consultants:  none  Code Status:  FULL  DVT Prophylaxis:apixaban   Procedures: As Listed in Progress Note Above  Antibiotics: None    Subjective: Patient states that his breathing is much better than yesterday. He still has dyspnea on exertion. Denies any fevers, chills, chest pain, nausea, vomiting or diarrhea. No abdominal pain, dysuria, hematuria. No hematochezia or melena.   Objective: Vitals:   11/25/15 2100 11/25/15 2359 11/26/15 0000 11/26/15 0557  BP: 116/88 (!) 112/55 (!) 103/52 140/73  Pulse: (!) 139 62 94 81  Resp: '20 18 18 18  '$ Temp: 98.4  F (36.9 C) 98 F (36.7 C) 98.3 F (36.8 C) 98.1 F  (36.7 C)  TempSrc: Oral Oral Oral Oral  SpO2: 93% 94% 93% 93%  Weight:    90 kg (198 lb 6.4 oz)  Height:        Intake/Output Summary (Last 24 hours) at 11/26/15 0741 Last data filed at 11/26/15 0136  Gross per 24 hour  Intake              120 ml  Output             2400 ml  Net            -2280 ml   Weight change:  Exam:   General:  Pt is alert, follows commands appropriately, not in acute distress  HEENT: No icterus, No thrush, No neck mass, [place]/AT  Cardiovascular: IRRR, S1/S2, no rubs, no gallops  Respiratory: Diminished breath sounds left lung; right basilar crackles. No wheeze  Abdomen: Soft/+BS, non tender, non distended, no guarding  Extremities: trace LE edema, No lymphangitis, No petechiae, No rashes, no synovitis   Data Reviewed: I have personally reviewed following labs and imaging studies Basic Metabolic Panel:  Recent Labs Lab 11/25/15 1210 11/25/15 1835 11/26/15 0416  NA 136  --  138  K 4.4  --  3.8  CL 90*  --  83*  CO2 41*  --  45*  GLUCOSE 141*  --  159*  BUN 18  --  26*  CREATININE 1.21  --  1.39*  CALCIUM 9.5  --  9.7  MG  --  2.1  --    Liver Function Tests: No results for input(s): AST, ALT, ALKPHOS, BILITOT, PROT, ALBUMIN in the last 168 hours. No results for input(s): LIPASE, AMYLASE in the last 168 hours. No results for input(s): AMMONIA in the last 168 hours. Coagulation Profile: No results for input(s): INR, PROTIME in the last 168 hours. CBC:  Recent Labs Lab 11/25/15 1210  WBC 9.4  HGB 9.5*  HCT 32.8*  MCV 93.7  PLT 261   Cardiac Enzymes: No results for input(s): CKTOTAL, CKMB, CKMBINDEX, TROPONINI in the last 168 hours. BNP: Invalid input(s): POCBNP CBG:  Recent Labs Lab 11/25/15 2200 11/26/15 0556  GLUCAP 338* 135*   HbA1C: No results for input(s): HGBA1C in the last 72 hours. Urine analysis:    Component Value Date/Time   COLORURINE YELLOW 10/26/2015 1031   APPEARANCEUR CLEAR 10/26/2015 1031   LABSPEC  1.024 10/26/2015 1031   PHURINE 6.5 10/26/2015 1031   GLUCOSEU NEGATIVE 10/26/2015 1031   HGBUR SMALL (A) 10/26/2015 1031   BILIRUBINUR NEGATIVE 10/26/2015 1031   KETONESUR NEGATIVE 10/26/2015 1031   PROTEINUR >300 (A) 10/26/2015 1031   UROBILINOGEN 0.2 06/02/2007 2336   NITRITE NEGATIVE 10/26/2015 1031   LEUKOCYTESUR NEGATIVE 10/26/2015 1031   Sepsis Labs: '@LABRCNTIP'$ (procalcitonin:4,lacticidven:4) )No results found for this or any previous visit (from the past 240 hour(s)).   Scheduled Meds: . apixaban  5 mg Oral BID  . aspirin  324 mg Oral Once  . carvedilol  12.5 mg Oral BID  . cholecalciferol  2,000 Units Oral BID  . diltiazem  180 mg Oral QHS  . docusate sodium  100 mg Oral BID  . furosemide  40 mg Intravenous Q12H  . insulin aspart  0-5 Units Subcutaneous QHS  . insulin aspart  0-9 Units Subcutaneous TID WC  . ipratropium-albuterol  3 mL Nebulization TID  . losartan  50 mg Oral Daily  .  magnesium oxide  400 mg Oral QHS  . multivitamin with minerals  1 tablet Oral Daily  . omega-3 acid ethyl esters  1 g Oral BID  . sodium chloride flush  3 mL Intravenous Q12H   Continuous Infusions:   Procedures/Studies: Dg Chest 2 View  Result Date: 11/25/2015 CLINICAL DATA:  Shortness of breath for several days. EXAM: CHEST  2 VIEW COMPARISON:  10/27/2015 FINDINGS: Previous median sternotomy and CABG procedure. Postoperative appearance of the left hemi thorax compatible with previous pneumonectomy. There is a small right pleural effusion and mild interstitial edema. IMPRESSION: 1. Suspect mild CHF. 2. Left pneumonectomy. Electronically Signed   By: Kerby Moors M.D.   On: 11/25/2015 13:46   Mr Brain Wo Contrast  Result Date: 10/27/2015 CLINICAL DATA:  Altered mental status and headache. Extremity weakness. EXAM: MRI HEAD WITHOUT CONTRAST MRI CERVICAL SPINE WITHOUT CONTRAST TECHNIQUE: Multiplanar, multiecho pulse sequences of the brain and surrounding structures, and cervical spine, to  include the craniocervical junction and cervicothoracic junction, were obtained without intravenous contrast. COMPARISON:  CT 10/26/2015.  MRI 05/18/2011 FINDINGS: MRI HEAD FINDINGS Image quality degraded by mild motion. Generalized atrophy.  Negative for hydrocephalus Negative for acute infarct. Mild chronic microvascular ischemic changes in the white matter. 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 Brainstem and cerebellum intact. Negative for intracranial hemorrhage. No mass or edema. No shift of the midline structures Paranasal sinuses clear. No orbital lesion. Pituitary not enlarged. MRI CERVICAL SPINE FINDINGS Alignment: Normal alignment. Reversal of the cervical lordosis with mild kyphosis. Vertebrae: Negative for fracture or mass.  Normal bone marrow. Cord: No cord lesion identified. Motion degraded images with limited evaluation of the cord. Posterior Fossa, vertebral arteries, paraspinal tissues: Negative Disc levels: C1-2: Degenerative changes C1-C2 without significant stenosis. C2-3: Mild disc and facet degeneration. No significant spinal or foraminal stenosis C3-4: Moderate disc degeneration and spondylosis with diffuse uncinate spurring. Bilateral facet hypertrophy. Mild spinal stenosis and mild foraminal stenosis bilaterally C4-5: Disc degeneration and mild to moderate spondylosis left greater than right. Bilateral facet hypertrophy. Moderate left foraminal encroachment and mild right foraminal encroachment. Mild spinal stenosis. C5-6: Disc degeneration and moderate spondylosis. Mild spinal stenosis and moderate foraminal encroachment bilaterally. Bilateral facet degeneration C6-7: Disc degeneration and spondylosis with diffuse uncinate spurring and bilateral facet degeneration. Mild spinal stenosis and moderate foraminal encroachment bilaterally C7-T1:  Mild facet degeneration. IMPRESSION: Atrophy and mild chronic ischemic change. No acute intracranial abnormality Cervical spondylosis.  Multilevel disc and facet degeneration and spurring causing spinal and foraminal encroachment at multiple levels as described above. Electronically Signed   By: Franchot Gallo M.D.   On: 10/27/2015 11:56   Mr Cervical Spine Wo Contrast  Result Date: 10/27/2015 CLINICAL DATA:  Altered mental status and headache. Extremity weakness. EXAM: MRI HEAD WITHOUT CONTRAST MRI CERVICAL SPINE WITHOUT CONTRAST TECHNIQUE: Multiplanar, multiecho pulse sequences of the brain and surrounding structures, and cervical spine, to include the craniocervical junction and cervicothoracic junction, were obtained without intravenous contrast. COMPARISON:  CT 10/26/2015.  MRI 05/18/2011 FINDINGS: MRI HEAD FINDINGS Image quality degraded by mild motion. Generalized atrophy.  Negative for hydrocephalus Negative for acute infarct. Mild chronic microvascular ischemic changes in the white matter. 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 Brainstem and cerebellum intact. Negative for intracranial hemorrhage. No mass or edema. No shift of the midline structures Paranasal sinuses clear. No orbital lesion. Pituitary not enlarged. MRI CERVICAL SPINE FINDINGS Alignment: Normal alignment. Reversal of the cervical lordosis with mild kyphosis. Vertebrae: Negative  for fracture or mass.  Normal bone marrow. Cord: No cord lesion identified. Motion degraded images with limited evaluation of the cord. Posterior Fossa, vertebral arteries, paraspinal tissues: Negative Disc levels: C1-2: Degenerative changes C1-C2 without significant stenosis. C2-3: Mild disc and facet degeneration. No significant spinal or foraminal stenosis C3-4: Moderate disc degeneration and spondylosis with diffuse uncinate spurring. Bilateral facet hypertrophy. Mild spinal stenosis and mild foraminal stenosis bilaterally C4-5: Disc degeneration and mild to moderate spondylosis left greater than right. Bilateral facet hypertrophy. Moderate left foraminal encroachment and mild  right foraminal encroachment. Mild spinal stenosis. C5-6: Disc degeneration and moderate spondylosis. Mild spinal stenosis and moderate foraminal encroachment bilaterally. Bilateral facet degeneration C6-7: Disc degeneration and spondylosis with diffuse uncinate spurring and bilateral facet degeneration. Mild spinal stenosis and moderate foraminal encroachment bilaterally C7-T1:  Mild facet degeneration. IMPRESSION: Atrophy and mild chronic ischemic change. No acute intracranial abnormality Cervical spondylosis. Multilevel disc and facet degeneration and spurring causing spinal and foraminal encroachment at multiple levels as described above. Electronically Signed   By: Franchot Gallo M.D.   On: 10/27/2015 11:56    Katheryn Culliton, DO  [facility] Pager [phone]  If 7PM-7AM, please contact night-coverage [email or web address] Password [email or web address] 11/26/2015, 7:41 AM   LOS: 0 days

## 2015-11-27 ENCOUNTER — Inpatient Hospital Stay (HOSPITAL_COMMUNITY): Payer: Medicare Other

## 2015-11-27 ENCOUNTER — Encounter (HOSPITAL_COMMUNITY): Payer: Self-pay | Admitting: Physician Assistant

## 2015-11-27 DIAGNOSIS — Z902 Acquired absence of lung [part of]: Secondary | ICD-10-CM

## 2015-11-27 DIAGNOSIS — Z7189 Other specified counseling: Secondary | ICD-10-CM

## 2015-11-27 DIAGNOSIS — Z515 Encounter for palliative care: Secondary | ICD-10-CM

## 2015-11-27 DIAGNOSIS — I5033 Acute on chronic diastolic (congestive) heart failure: Secondary | ICD-10-CM

## 2015-11-27 DIAGNOSIS — K5901 Slow transit constipation: Secondary | ICD-10-CM

## 2015-11-27 LAB — GLUCOSE, CAPILLARY
GLUCOSE-CAPILLARY: 167 mg/dL — AB (ref 65–99)
Glucose-Capillary: 139 mg/dL — ABNORMAL HIGH (ref 65–99)
Glucose-Capillary: 198 mg/dL — ABNORMAL HIGH (ref 65–99)
Glucose-Capillary: 118 mg/dL — ABNORMAL HIGH (ref 65–99)
Glucose-Capillary: 164 mg/dL — ABNORMAL HIGH (ref 65–99)

## 2015-11-27 LAB — BASIC METABOLIC PANEL
Anion gap: 3 — ABNORMAL LOW (ref 5–15)
BUN: 32 mg/dL — ABNORMAL HIGH (ref 6–20)
CALCIUM: 9.3 mg/dL (ref 8.9–10.3)
CHLORIDE: 84 mmol/L — AB (ref 101–111)
CO2: 47 mmol/L — AB (ref 22–32)
CREATININE: 1.27 mg/dL — AB (ref 0.61–1.24)
GFR, EST AFRICAN AMERICAN: 60 mL/min — AB (ref >60)
GFR, EST NON AFRICAN AMERICAN: 52 mL/min — AB (ref >60)
Glucose, Bld: 176 mg/dL — ABNORMAL HIGH (ref 65–99)
POTASSIUM: 3.9 mmol/L (ref 3.5–5.1)
SODIUM: 134 mmol/L — AB (ref 135–145)

## 2015-11-27 LAB — MAGNESIUM: MAGNESIUM: 2.6 mg/dL — AB (ref 1.7–2.4)

## 2015-11-27 MED ORDER — FUROSEMIDE 10 MG/ML IJ SOLN
40.0000 mg | Freq: Once | INTRAMUSCULAR | Status: AC
  Administered 2015-11-27: 40 mg via INTRAVENOUS
  Filled 2015-11-27: qty 4

## 2015-11-27 MED ORDER — POLYETHYLENE GLYCOL 3350 17 G PO PACK
17.0000 g | PACK | Freq: Every day | ORAL | Status: DC
  Administered 2015-11-27 – 2015-11-29 (×3): 17 g via ORAL
  Filled 2015-11-27 (×3): qty 1

## 2015-11-27 NOTE — Consult Note (Signed)
Consultation Note Date: 11/27/2015   Patient Name: Calvin [name]  DOB: 1932/04/30  MRN: [id number]  Age / Sex: 82 y.o., male  PCP: Lona Kettle, MD Referring Physician: Orson Eva, MD  Reason for Consultation: Establishing goals of care  HPI/Patient Profile: 82 y.o. male  with past medical history of atrial fib/flutter, COPD s/p left pneumonectomy, lung cancer, chronic hypoxemic and hypercarbic respiratory failure on 2L home oxygen, diabetes, hypertension, pulmonary hypertension, CAD, chronic diastolic heart failure, dyslipidemia, and chronic thrombocytopenia. Patient admitted 11/25/2015 with increasing SOB. Wife called his Cardiologist asking to increase his home oxygen but was instructed to be evaluated in the ER. Patient was recently discharged on 7/8 after a diagnosis of respiratory failure requiring Bipap. Patient admitted with acute on chronic CHF and COPD exacerbation. He has been diuresed and continuing home medications.   Clinical Assessment and Goals of Care: Spoke with patient and wife about goals of care. Patient lives at home with his wife (no other assistance). She says he doesn't walk much but able to get from the bed to chair and walks to the kitchen with cane for all meals. Wife states he has a good appetite. Patient admits that it has gotten harder to perform daily tasks, such as shaving. He feels like he will "give out" at the sink. He has been on home oxygen for five years per wife. Currently has home health. Wife is hopeful that she can take him home.   Discussed code status with patient and wife. Patient does not wish to be resuscitated if something was to happen stating "I'm ready when it's my time to go." Wife acknowledges this. Discussed home hospice versus home health with them. Wife is wanting to talk to hospice representative and get more information before making any decisions.   HCPOA :   Wife  SUMMARY OF RECOMMENDATIONS  DNR/DNI.  Considering home [place] PT evaluation to determine if he can walk.  If not he may require a different disposition.  Code Status/Advance Care Planning:  Changed to DNR per patient.    Symptom Management:   LBM 8/6. History of constipation. Miralax daily.   Dyspnea: Continue supportive oxygen via nasal cannula (2L).  Patient denies pain. Will follow-up as needed.   Palliative Prophylaxis:   Bowel Regimen  Additional Recommendations (Limitations, Scope, Preferences):  Avoid Hospitalization and Minimize Medications  Psycho-social/Spiritual:   Desire for further Chaplaincy support:no  Additional Recommendations: Caregiving  Support/Resources and Education on Hospice  Prognosis:   < 6 months in the setting of advanced COPD, acute on chronic CHF, and multiple hospitalizations.  Discharge Planning: Home with Hospice vs. Home Health     Primary Diagnoses: Present on Admission: . Acute on chronic respiratory failure with hypoxia and hypercarbia (HCC) . COPD (chronic obstructive pulmonary disease) ([place]) . CAD (coronary artery disease), CABG 2007 . Thrombocytopenia ([place]) . Uncontrolled atrial fibrillation/flutter . Hyperlipidemia . HTN (hypertension) . Pulmonary hypertension ([place]) . Acute diastolic heart failure ([place])   I have reviewed the medical  record, interviewed the patient and family, and examined the patient. The following aspects are pertinent.  Past Medical History:  Diagnosis Date  . Arthritis   . Atrial flutter with rapid ventricular response ([place])    a. 2007 s/p DCCV following CV surgery;  b. 10/2014->CHA2DS2VASc = 5-->Xarelto.  . 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  Marland Kitchen CAD (coronary artery disease)    a. 06/2005 s/p CABG x1 (VG->RCA) @ time of myxoma resection.  . Carotid artery disease ([place])    a. 06/2005 s/p R CEA;  b. 10/2013 Carotid U/S: patent RICA, LICA 57-84%;   c. 09/9627 U/S: RICA 52-84%, LICA 13-24%.  Marland Kitchen COPD (chronic obstructive pulmonary disease) ([place])   . Daily headache   . Depression   . Essential hypertension   . GERD (gastroesophageal reflux disease)   . GI bleed 07/2015  . Hearing loss   . History of pneumonia   . Hypertriglyceridemia   . On home oxygen therapy    "2L; 24/7" (11/03/2014)  . Scarlet fever    a. ~ 1942.  Marland Kitchen Squamous cell lung cancer ([place])    a. s/p L pneumonectomy in 2000.  . Stroke ([place])   . Type II diabetes mellitus ([place])    Social History   Social History  . Marital status: Married    Spouse name: N/A  . Number of children: N/A  . Years of education: N/A   Occupational History  . retired Retired   Social History Main Topics  . Smoking status: Former Smoker    Packs/day: 2.00    Years: 43.00    Types: Cigarettes    Quit date: 07/21/1997  . Smokeless tobacco: Never Used  . Alcohol use 0.0 oz/week     Comment: 11/03/2014 "never drank much; stopped in the early 1970's"  . Drug use: No  . Sexual activity: Not Currently   Other Topics Concern  . None   Social History Narrative  . None   Family History  Problem Relation Age of Onset  . Heart disease Mother     NOT before age 61  . Heart attack Mother   . Diabetes Mother   . Hyperlipidemia Mother   . Heart disease Sister   . Cancer Sister     Breast  . Hypertension Sister   . Stomach cancer Father   . Cancer Father     Stomach  . Hyperlipidemia Father   . Hyperlipidemia Brother   . Heart disease Brother    Scheduled Meds: . apixaban  5 mg Oral BID  . aspirin  324 mg Oral Once  . carvedilol  12.5 mg Oral BID  . cholecalciferol  2,000 Units Oral BID  . diltiazem  180 mg Oral QHS  . docusate sodium  100 mg Oral BID  . furosemide  40 mg Oral Daily  . insulin aspart  0-5 Units Subcutaneous QHS  . insulin aspart  0-9 Units Subcutaneous TID WC  . ipratropium-albuterol  3 mL Nebulization TID  . losartan  25 mg Oral Daily  . magnesium oxide   400 mg Oral QHS  . multivitamin with minerals  1 tablet Oral Daily  . omega-3 acid ethyl esters  1 g Oral BID  . polyethylene glycol  17 g Oral Daily  . predniSONE  40 mg Oral BID WC  . [START ON 11/28/2015] predniSONE  50 mg Oral Q breakfast  . sodium chloride flush  3 mL Intravenous Q12H   Continuous  Infusions:  PRN Meds:.sodium chloride, acetaminophen, ipratropium-albuterol, loratadine, ondansetron (ZOFRAN) IV, sodium chloride flush  Allergies  Allergen Reactions  . Bee Venom Anaphylaxis  . Sulfa Antibiotics   . Amoxicillin Rash  . Statins Other (See Comments)    Muscle aches   Review of Systems  Physical Exam  Constitutional: He is oriented to person, place, and time. He appears well-developed and well-nourished. He is cooperative.  Hard of hearing  Cardiovascular: An irregularly irregular rhythm present.  Pulmonary/Chest: No tachypnea.  Abdominal: Soft. Normal appearance. He exhibits no distension and no mass. There is no tenderness.  Neurological: He is alert and oriented to person, place, and time. GCS eye subscore is 4. GCS verbal subscore is 5. GCS motor subscore is 6.  Skin: Skin is warm, dry and intact.  Psychiatric: He has a normal mood and affect. His speech is normal and behavior is normal. Thought content normal. Cognition and memory are normal.  Nursing note and vitals reviewed.   Vital Signs: BP 121/64 (BP Location: Left Arm)   Pulse (!) 104   Temp 98 F (36.7 C) (Oral)   Resp 18   Ht '5\' 10"'$  (1.778 m)   Wt 90 kg (198 lb 8 oz)   SpO2 97%   BMI 28.48 kg/m  Pain Assessment: 0-10   Pain Score: 2    SpO2: SpO2: 97 % O2 Device:SpO2: 97 % O2 Flow Rate: .O2 Flow Rate (L/min): 2 L/min  IO: Intake/output summary:  Intake/Output Summary (Last 24 hours) at 11/27/15 1124 Last data filed at 11/27/15 1036  Gross per 24 hour  Intake              960 ml  Output             1096 ml  Net             -136 ml    LBM: Last BM Date: 11/24/15 Baseline Weight:  Weight: 90.4 kg (199 lb 4.7 oz) (scale A) Most recent weight: Weight: 90 kg (198 lb 8 oz)     Palliative Assessment/Data: 40%    Time In: 1000 Time Out: 1110 Time Total: 45mn Greater than 50%  of this time was spent counseling and coordinating care related to the above assessment and plan.  Signed by:   MBasilio Cairo NP  MImogene Burn PA-C Palliative Medicine Pager: [phone]   Please contact Palliative Medicine Team phone at [phone] questions and concerns.  For individual provider: See AShea Evans

## 2015-11-27 NOTE — Care Management Important Message (Signed)
Important Message  Patient Details  Name: Calvin [name] MRN: [id number] Date of Birth: 1932/12/06   Medicare Important Message Given:  Yes    Loann Quill 11/27/2015, 8:33 AM

## 2015-11-27 NOTE — Progress Notes (Signed)
PROGRESS NOTE  BELMONT VALLI BMW:[id number] DOB: 08/28/1932 DOA: 11/25/2015 PCP:  [name], MD  Brief History:  82 y/o male with history of lung cancer s/p Left pneumonectomy in 2000, COPD, CAD status post CABG 10 years ago, atrial fibrillation, chronic respiratory failure on 1.5 L, diabetes mellitus, hypertension, hyperlipidemia presented with two-week history of increasing shortness of breath. The patient was recently discharged from the hospital after a stay from 10/26/2015 through 10/28/2015 for acute on chronic respiratory failure secondary to COPD exacerbation and acute metabolic encephalopathy. He required BiPAP during that admission and was discharged home on 2 L nasal cannula. In the past week, the patient has noted increasing palpitations particularly with activity. Apparently his home health RN noted his heart rate up to the 130s, but improves with rest. On 11/22/2015 the patient began having increasing shortness of breath without improvement despite using his nebulizer treatments. Apparently the patient had a weight of 207 and his primary care provider's office this past week. His discharge weight on 10/28/2015 was 201 pounds. Workup in the emergency department reveal a chest x-ray that showed vascular congestion. The patient was given furosemide 80 mg IV 1 and Solu-Medrol 125 mg IV 1.  Medical records reviewed and summarized.  Assessment/Plan: Acute on chronic respiratory failure with hypoxia and hypercarbia -Although pulmonary edema/CHF may be playing a role in his shortness of breath I feel this is likely multifactorial including his A. fib with RVR and likely COPD exacerbation contributing -His admission weight is less than his discharge weight 1 month ago -Presently stable on 2 L nasal cannula--95-97 percent saturation -consult palliative medicine for GOC--Dr. Titus Mould previously stated pt would not do well if had to be intubated  Acute on chronic diastolic CHF -R>L  heart failure -NEG 2.4 L since admission. -10/28/2015 discharge weight 201 pounds -11/25/2015 admission weight 199 pounds -Monitor renal function -Daily weights -10/27/2015 echocardiogram EF 60-65%, grade 1 DD, PAP 44 -11/27/15 personally reviewed CXR--persistent interstitial markings on R -give extra dose of IV lasix  COPD exacerbation -Continue by mouth prednisone -Continue bronchodilators  Paroxysmal atrial fibrillation/flutter -Rate controlled -Continue carvedilol and diltiazem -Continue apixaban -10/26/2015 TSH  0.800  Hypertension -Controlled -Continue carvedilol and losartan  Coronary artery disease -No anginal symptoms -personally reviewed EKG--atrial flutter, nonspecific ST changes -Continue aspirin  Diabetes mellitus type 2 -CBG is elevated secondary to steroids -Discontinue metformin -10/26/2015: A1c 6.2 -Allow for liberal glycemic control  Renal artery stenosis  -This has likely been a chronic issue  -Follow up with [facility] in outpt setting -this is contributing to his labile HTN  Hyperlipidemia  -Continue statin   Gait instability and headache -10/27/15--MRI brain--neg for acute findings  History lung cancer status post left pneumonectomy    Disposition Plan:   Home 11/28/15 if stable  Family Communication:  wife updated at bedside 11/27/15--Total time spent 35 minutes.  Greater than 50% spent face to face counseling and coordinating care.   Consultants:  none  Code Status:  FULL  DVT Prophylaxis:apixaban   Procedures: As Listed in Progress Note Above  Antibiotics: None   Subjective: Patient states that he is not breathing is good yesterday but overall better than the day of admission. Denies any fevers, chills, chest pain, nausea, vomiting, diarrhea, abdominal pain, dysuria, hematuria. No rashes. No hematochezia or melena.  Objective: Vitals:   11/27/15 0128 11/27/15 0456 11/27/15 0600 11/27/15 1144  BP: (!) 153/63 121/64   (!) 120/59  Pulse: 97 (!)  104  82  Resp: '20 18  18  '$ Temp: 97.4 F (36.3 C) 98 F (36.7 C)  98.1 F (36.7 C)  TempSrc: Oral Oral  Oral  SpO2: 92% 97%  94%  Weight:   90 kg (198 lb 8 oz)   Height:        Intake/Output Summary (Last 24 hours) at 11/27/15 1644 Last data filed at 11/27/15 1036  Gross per 24 hour  Intake              600 ml  Output              625 ml  Net              -25 ml   Weight change: -0.361 kg (-12.7 oz) Exam:   General:  Pt is alert, follows commands appropriately, not in acute distress  HEENT: No icterus, No thrush, No neck mass, [place]/AT  Cardiovascular: IRRR, S1/S2, no rubs, no gallops  Respiratory: Right basilar crackles. Left diminished breath sounds.  Abdomen: Soft/+BS, non tender, non distended, no guarding  Extremities: trace LE edema, No lymphangitis, No petechiae, No rashes, no synovitis   Data Reviewed: I have personally reviewed following labs and imaging studies Basic Metabolic Panel:  Recent Labs Lab 11/25/15 1210 11/25/15 1835 11/26/15 0416 11/27/15 0328  NA 136  --  138 134*  K 4.4  --  3.8 3.9  CL 90*  --  83* 84*  CO2 41*  --  45* 47*  GLUCOSE 141*  --  159* 176*  BUN 18  --  26* 32*  CREATININE 1.21  --  1.39* 1.27*  CALCIUM 9.5  --  9.7 9.3  MG  --  2.1  --  2.6*   Liver Function Tests: No results for input(s): AST, ALT, ALKPHOS, BILITOT, PROT, ALBUMIN in the last 168 hours. No results for input(s): LIPASE, AMYLASE in the last 168 hours. No results for input(s): AMMONIA in the last 168 hours. Coagulation Profile: No results for input(s): INR, PROTIME in the last 168 hours. CBC:  Recent Labs Lab 11/25/15 1210  WBC 9.4  HGB 9.5*  HCT 32.8*  MCV 93.7  PLT 261   Cardiac Enzymes: No results for input(s): CKTOTAL, CKMB, CKMBINDEX, TROPONINI in the last 168 hours. BNP: Invalid input(s): POCBNP CBG:  Recent Labs Lab 11/26/15 1629 11/26/15 2128 11/27/15 0600 11/27/15 1144 11/27/15 1636  GLUCAP 171*  183* 167* 198* 118*   HbA1C: No results for input(s): HGBA1C in the last 72 hours. Urine analysis:    Component Value Date/Time   COLORURINE YELLOW 10/26/2015 1031   APPEARANCEUR CLEAR 10/26/2015 1031   LABSPEC 1.024 10/26/2015 1031   PHURINE 6.5 10/26/2015 1031   GLUCOSEU NEGATIVE 10/26/2015 1031   HGBUR SMALL (A) 10/26/2015 1031   BILIRUBINUR NEGATIVE 10/26/2015 1031   KETONESUR NEGATIVE 10/26/2015 1031   PROTEINUR >300 (A) 10/26/2015 1031   UROBILINOGEN 0.2 06/02/2007 2336   NITRITE NEGATIVE 10/26/2015 1031   LEUKOCYTESUR NEGATIVE 10/26/2015 1031   Sepsis Labs: '@LABRCNTIP'$ (procalcitonin:4,lacticidven:4) )No results found for this or any previous visit (from the past 240 hour(s)).   Scheduled Meds: . apixaban  5 mg Oral BID  . aspirin  324 mg Oral Once  . carvedilol  12.5 mg Oral BID  . cholecalciferol  2,000 Units Oral BID  . diltiazem  180 mg Oral QHS  . docusate sodium  100 mg Oral BID  . furosemide  40 mg Intravenous Once  . furosemide  40 mg  Oral Daily  . insulin aspart  0-5 Units Subcutaneous QHS  . insulin aspart  0-9 Units Subcutaneous TID WC  . ipratropium-albuterol  3 mL Nebulization TID  . losartan  25 mg Oral Daily  . magnesium oxide  400 mg Oral QHS  . multivitamin with minerals  1 tablet Oral Daily  . omega-3 acid ethyl esters  1 g Oral BID  . polyethylene glycol  17 g Oral Daily  . predniSONE  40 mg Oral BID WC  . [START ON 11/28/2015] predniSONE  50 mg Oral Q breakfast  . sodium chloride flush  3 mL Intravenous Q12H   Continuous Infusions:   Procedures/Studies: Dg Chest 2 View  Result Date: 11/26/2015 CLINICAL DATA:  Shortness of breath. EXAM: CHEST  2 VIEW COMPARISON:  November 25, 2015 FINDINGS: The patient is status post left pneumonectomy. The left chest is stable. Hyperexpansion of the right lung. No pneumothorax. Mild interstitial prominence with no focal infiltrate, nodule, or mass. No other interval changes. IMPRESSION: 1. Hyperexpansion of the  right lung, stable. Suggested pulmonary venous congestion without overt edema. No focal infiltrate. Electronically Signed   By: Dorise Bullion III M.D   On: 11/26/2015 08:10   Dg Chest 2 View  Result Date: 11/25/2015 CLINICAL DATA:  Shortness of breath for several days. EXAM: CHEST  2 VIEW COMPARISON:  10/27/2015 FINDINGS: Previous median sternotomy and CABG procedure. Postoperative appearance of the left hemi thorax compatible with previous pneumonectomy. There is a small right pleural effusion and mild interstitial edema. IMPRESSION: 1. Suspect mild CHF. 2. Left pneumonectomy. Electronically Signed   By: Kerby Moors M.D.   On: 11/25/2015 13:46   Dg Chest Port 1 View  Result Date: 11/27/2015 CLINICAL DATA:  Shortness of breath for 2 days EXAM: PORTABLE CHEST 1 VIEW COMPARISON:  11/26/2015 FINDINGS: Right lung is again hyperinflated. The left lung again demonstrates significant calcific changes along the pleural margin with opacification consistent with prior pneumonectomy. No new focal abnormality is seen. IMPRESSION: Changes of prior left pneumonectomy. Hyper expansion of the right lung is noted. Electronically Signed   By: Inez Catalina M.D.   On: 11/27/2015 16:01    Andru Genter, DO  [facility] Pager [phone]  If 7PM-7AM, please contact night-coverage [email or web address] Password [email or web address] 11/27/2015, 4:44 PM   LOS: 1 day

## 2015-11-28 DIAGNOSIS — R079 Chest pain, unspecified: Secondary | ICD-10-CM

## 2015-11-28 DIAGNOSIS — I25119 Atherosclerotic heart disease of native coronary artery with unspecified angina pectoris: Secondary | ICD-10-CM

## 2015-11-28 DIAGNOSIS — I5031 Acute diastolic (congestive) heart failure: Secondary | ICD-10-CM

## 2015-11-28 DIAGNOSIS — I483 Typical atrial flutter: Secondary | ICD-10-CM

## 2015-11-28 LAB — GLUCOSE, CAPILLARY
GLUCOSE-CAPILLARY: 147 mg/dL — AB (ref 65–99)
GLUCOSE-CAPILLARY: 145 mg/dL — AB (ref 65–99)
Glucose-Capillary: 288 mg/dL — ABNORMAL HIGH (ref 65–99)

## 2015-11-28 LAB — BASIC METABOLIC PANEL
Anion gap: 6 (ref 5–15)
BUN: 39 mg/dL — ABNORMAL HIGH (ref 6–20)
CALCIUM: 9.1 mg/dL (ref 8.9–10.3)
CO2: 46 mmol/L — AB (ref 22–32)
CREATININE: 1.3 mg/dL — AB (ref 0.61–1.24)
Chloride: 85 mmol/L — ABNORMAL LOW (ref 101–111)
GFR calc non Af Amer: 50 mL/min — ABNORMAL LOW (ref >60)
GFR, EST AFRICAN AMERICAN: 58 mL/min — AB (ref >60)
Glucose, Bld: 175 mg/dL — ABNORMAL HIGH (ref 65–99)
Potassium: 4.2 mmol/L (ref 3.5–5.1)
SODIUM: 137 mmol/L (ref 135–145)

## 2015-11-28 LAB — TROPONIN I
TROPONIN I: 0.97 ng/mL — AB (ref <0.03)
Troponin I: 0.95 ng/mL (ref <0.03)

## 2015-11-28 LAB — BRAIN NATRIURETIC PEPTIDE: B Natriuretic Peptide: 235.3 pg/mL — ABNORMAL HIGH (ref 0.0–100.0)

## 2015-11-28 MED ORDER — DILTIAZEM HCL 30 MG PO TABS
30.0000 mg | ORAL_TABLET | Freq: Four times a day (QID) | ORAL | Status: AC
  Administered 2015-11-28 (×2): 30 mg via ORAL
  Filled 2015-11-28 (×2): qty 1

## 2015-11-28 MED ORDER — DILTIAZEM HCL 60 MG PO TABS
60.0000 mg | ORAL_TABLET | Freq: Four times a day (QID) | ORAL | Status: DC
  Administered 2015-11-28 – 2015-11-29 (×2): 60 mg via ORAL
  Filled 2015-11-28 (×2): qty 1

## 2015-11-28 MED ORDER — PREDNISONE 20 MG PO TABS
40.0000 mg | ORAL_TABLET | Freq: Every day | ORAL | Status: DC
  Administered 2015-11-29: 40 mg via ORAL
  Filled 2015-11-28: qty 2

## 2015-11-28 NOTE — Consult Note (Signed)
[facility] Met with the patient and wife, [name] at the bedside to discuss hospice referral. Reviewed hospice philosophy and scope of services that are consistent with stated Goals of Care. Discussed with Dr. Brock Ra, who approved for hospice services. Please notify [facility] [facility] when a discharge date is known. The patient currently has O2 in the home. [name] requests that additional DME needs be determined at time of admission. Thank you for this referral.  Yetta Glassman, RN Hospital/Commumnity Liaison [facility] [phone] (cell)

## 2015-11-28 NOTE — Care Management Note (Signed)
Case Management Note  Patient Details  Name: Calvin [name] MRN: [id number] Date of Birth: February 16, 1933  Subjective/Objective:       Admitted with CHF             Action/Plan: Received referral for home hospice. Hospice choice offered, spouse requested [facility]. Referral made as requested. Talked to Manus Gunning, she will send someone to see the pt and spouse this afternoon.  Expected Discharge Date:   possibly 11/29/2015               Expected Discharge Plan:  [place]  Discharge planning Services  CM Consult   Choice offered to:  Patient, Spouse  [facility] Agency:  [place]  Status of Service:  In process, will continue to follow  Sherrilyn Rist [phone] 11/28/2015, 9:56 AM

## 2015-11-28 NOTE — Progress Notes (Signed)
  CRITICAL VALUE ALERT  Critical value received:  Troponin 0.95  Date of notification:  11/28/15  Time of notification:  5208  Critical value read back:Yes.    Nurse who received alert:  Clarise Cruz  MD notified (1st page):  Dr. Carles Collet  Time of first page:  27  MD notified (2nd page):  Time of second page:  Responding MD:  [id number]  Time MD responded:  1540

## 2015-11-28 NOTE — Progress Notes (Addendum)
Notified by RN that troponin is 0.95;  Chest pressure from this am has resolved. -EKG with Aflutter without concerning ischemic changes -suspect elevated troponin likely due to mild RVR today and CHF -consult cardiology -finish trending troponins  I had long discussion with wife prior to cardiology consult.  Plan was to go home with hospice type services when stable for d/c.  I discussed goals of care with wife in light of intention for hospice services.  Wife stated she still wants cardiology to evaluate patient, but she does not want any invasive procedures.  She states "just treat him with medicines if they can".  She stated she is not ready for full comfort currently.  Pt already received cardizem CD 180 this am then HR up to 110 sustained.  Added short acting diltiazem 30 mg q 6 and had planned to switch him to cardizem CD 240 tomorrow.  HR now better with adding extra dilt.  DTat

## 2015-11-28 NOTE — Consult Note (Signed)
Patient ID: Calvin [name] MRN: [id number], DOB/AGE: 1932-06-06   Admit date: 11/25/2015   Reason for Consult: Chest Pain/ NSTEMI Requesting MD: Dr. Carles Collet   Primary Physician:  [name], MD Primary Cardiologist: Dr. Irish Lack  Pt. Profile:  82 y/o male with h/o CAD s/p remote CABG x 1, chronic diastolic HF, chronic atrial flutter on Eliquis, COPD, remote h/o lung cancer, chronic respiratory failure requiring home O2, admitted for a/c diastolic CHF exacerbation. During admit patient developed CP, in the setting of rapid Aflutter with elevated troponin of 0.95. Note: Patient recently made decision to be DNR and plans to be discharged home with [facility]. Consult placed per family request.   Problem List  Past Medical History:  Diagnosis Date  . Arthritis   . Atrial flutter with rapid ventricular response ([place])    a. 2007 s/p DCCV following CV surgery;  b. 10/2014->CHA2DS2VASc = 5-->Xarelto.  . 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  Marland Kitchen CAD (coronary artery disease)    a. 06/2005 s/p CABG x1 (VG->RCA) @ time of myxoma resection.  . Carotid artery disease ([place])    a. 06/2005 s/p R CEA;  b. 10/2013 Carotid U/S: patent RICA, LICA 80-32%;  c. 04/2246 U/S: RICA 25-00%, LICA 37-04%.  Marland Kitchen COPD (chronic obstructive pulmonary disease) ([place])   . Daily headache   . Depression   . Essential hypertension   . GERD (gastroesophageal reflux disease)   . GI bleed 07/2015  . Hearing loss   . History of pneumonia   . Hypertriglyceridemia   . On home oxygen therapy    "2L; 24/7" (11/03/2014)  . Scarlet fever    a. ~ 1942.  Marland Kitchen Squamous cell lung cancer ([place])    a. s/p L pneumonectomy in 2000.  . Stroke ([place])   . Type II diabetes mellitus ([place])     Past Surgical History:  Procedure Laterality Date  . CARDIAC CATHETERIZATION  06/2005; 01/2010   Archie Endo 6/16/2012Marland Kitchen Archie Endo 02/02/2010  . CARDIOVERSION  07/2005   DCC/notes 09/04/2010  . CAROTID ENDARTERECTOMY Right 2007  . COLONOSCOPY N/A  07/27/2015   Procedure: COLONOSCOPY;  Surgeon: Wonda Horner, MD;  Location: [facility] ENDOSCOPY;  Service: Endoscopy;  Laterality: N/A;  . COMBINED MEDIASTINOSCOPY AND BRONCHOSCOPY  05/01/1998   Dr Servando Snare  . CORONARY ARTERY BYPASS GRAFT  07/12/2005   Dr Servando Snare, CABG x 1 with reverse saphenous vein graft to distal rt coronary artery and excision of left atrial myxoma [Other]  . ESOPHAGOGASTRODUODENOSCOPY (EGD) WITH PROPOFOL N/A 07/26/2015   Procedure: ESOPHAGOGASTRODUODENOSCOPY (EGD) WITH PROPOFOL;  Surgeon: Wonda Horner, MD;  Location: [facility] ENDOSCOPY;  Service: Endoscopy;  Laterality: N/A;  . PNEUMONECTOMY Left 05/03/1998   Dr Servando Snare  . RENAL ANGIOGRAM Bilateral 12/2005   Archie Endo 09/04/2010  . TRANSESOPHAGEAL ECHOCARDIOGRAM  06/2005   Archie Endo 09/04/2010     Allergies  Allergies  Allergen Reactions  . Bee Venom Anaphylaxis  . Sulfa Antibiotics   . Amoxicillin Rash  . Statins Other (See Comments)    Muscle aches    HPI  82 y/o male with h/o prior left pneumonectomy for a squamous cell carcinoma over 17 years ago. He has known CAD with remote CABG x 1 per Dr. Roxy Horseman over 10 years ago and resection of an atrial myxoma back in 2007. He has known underlying pulmonary disease, on chronic home O2, 2L/min.  He has had prior TIA and h/o carotid artery disease, s/p right CEA in March 2007.  He also has chronic atrial flutter and is maintained with rate control and was previously on Xarelto for anticoagulation, however he was switched to Eliquis after GI bleed. His CHA2DS2 VASc score is 6 (HTN, Age >75, prior CVA and vascular disease). His last LHC was in 2011. He was found to have single vessel disease with an occluded RCA but patent SVG-RCA. EF was 65%. 2D echo in 2013 showed EF of 55-60%. No significant valvular disease. He also has diabetes, hypertension, pulmonary hypertension, chronic diastolic heart failure, dyslipidemia, and chronic thrombocytopenia.  He was recently admitted 10/2015 with acute  respiratory failure requiring Bipap. He was discharged home on 7/8. 2D echo was obtained that admission, whic showed normal LVEF of 60-65%. Wall motion was normal.   He presented [facility] on 11/25/15 with complaint of progressive dyspnea. He was felt to be in acute on chronic diastolic CHF + COPD exacerbation. He was admitted by IM and treated with IV lasix. Palliative Care was consulted 11/27/15 to discuss goals of care. Patient is DNR. Decision was also made to go home with [facility].   On 8/8, patient complained of CP.  This was shortly after a brief period of atrial flutter w/ RVR. HR spiked and fluctuated from the 120s-upper 140s. This was after working with PT. Patient notes his chest felt tight. No increased dyspnea. He thought it was gas (occured after eating lunch). It lasted about 1 hrs then resolved. No further recurrece Cardiac enzymes were cycled and intial returned abnormal at 0.95. His CP has resolved. Per patient and family request, cardiology has been consulted for recommendations. Patient and family are clear that they do not want any invasive procedures. Medical therapy only.   Home Medications  Prior to Admission medications   Medication Sig Start Date End Date Taking? Authorizing Provider  acetaminophen (TYLENOL) 325 MG tablet Take 325 mg by mouth every 6 (six) hours as needed for mild pain, moderate pain or headache. For pain   Yes Historical Provider, MD  albuterol (PROVENTIL HFA;VENTOLIN HFA) 108 (90 BASE) MCG/ACT inhaler Inhale 2 puffs into the lungs every 6 (six) hours as needed for wheezing.   Yes Historical Provider, MD  apixaban (ELIQUIS) 5 MG TABS tablet Take 1 tablet (5 mg total) by mouth 2 (two) times daily. 07/31/15  Yes Thurnell Lose, MD  carvedilol (COREG) 12.5 MG tablet Take 1 tablet (12.5 mg total) by mouth 2 (two) times daily. 11/20/15  Yes Jettie Booze, MD  cetirizine (ZYRTEC) 10 MG tablet Take 10 mg by mouth daily as needed for allergies or  rhinitis. For allergies   Yes Historical Provider, MD  cholecalciferol (VITAMIN D) 1000 UNITS tablet Take 2,000 Units by mouth 2 (two) times daily.    Yes Historical Provider, MD  diltiazem (CARDIZEM CD) 180 MG 24 hr capsule Take 1 capsule (180 mg total) by mouth at bedtime. 10/18/15  Yes Burtis Junes, NP  docusate sodium (COLACE) 100 MG capsule Take 2 capsules (200 mg total) by mouth daily. Patient taking differently: Take 100 mg by mouth 2 (two) times daily.  07/31/15  Yes Thurnell Lose, MD  EPIPEN 2-PAK 0.3 MG/0.3ML DEVI Inject 0.3 mg into the skin See admin instructions. Take as needed for severe allergic reaction 11/04/11  Yes Historical Provider, MD  ipratropium-albuterol (DUONEB) 0.5-2.5 (3) MG/3ML SOLN Take 3 mLs by nebulization 3 (three) times daily. Patient taking differently: Take 3 mLs by nebulization 4 (four) times daily - after meals and  at bedtime.  02/27/15  Yes Brand Males, MD  losartan-hydrochlorothiazide (HYZAAR) 100-25 MG tablet Take 0.5 tablets by mouth daily. Patient taking differently: Take 1 tablet by mouth daily.  10/29/15  Yes Orson Eva, MD  magnesium oxide (MAG-OX) 400 MG tablet Take 400 mg by mouth at bedtime.    Yes Historical Provider, MD  metFORMIN (GLUCOPHAGE) 500 MG tablet Take 500 mg by mouth daily with breakfast.  03/24/14  Yes Historical Provider, MD  Multiple Vitamins-Minerals (MULTIVITAMINS THER. W/MINERALS) TABS Take 1 tablet by mouth daily.   Yes Historical Provider, MD  NON FORMULARY Place 2 L into the nose continuous.   Yes Historical Provider, MD  Omega-3 Fatty Acids (FISH OIL) 1200 MG CAPS Take 1 capsule by mouth 2 (two) times daily.   Yes Historical Provider, MD  polyethylene glycol (MIRALAX / GLYCOLAX) packet Take 17 g by mouth 2 (two) times daily. Patient taking differently: Take 17 g by mouth daily as needed for mild constipation.  07/31/15  Yes Thurnell Lose, MD  triamcinolone cream (KENALOG) 0.1 % Apply 1 application topically 2 (two) times  daily as needed ([place]).  04/18/15  Yes Historical Provider, MD    Family History  Family History  Problem Relation Age of Onset  . Heart disease Mother     NOT before age 57  . Heart attack Mother   . Diabetes Mother   . Hyperlipidemia Mother   . Heart disease Sister   . Cancer Sister     Breast  . Hypertension Sister   . Stomach cancer Father   . Cancer Father     Stomach  . Hyperlipidemia Father   . Hyperlipidemia Brother   . Heart disease Brother     Social History  Social History   Social History  . Marital status: Married    Spouse name: N/A  . Number of children: N/A  . Years of education: N/A   Occupational History  . retired Retired   Social History Main Topics  . Smoking status: Former Smoker    Packs/day: 2.00    Years: 43.00    Types: Cigarettes    Quit date: 07/21/1997  . Smokeless tobacco: Never Used  . Alcohol use 0.0 oz/week     Comment: 11/03/2014 "never drank much; stopped in the early 1970's"  . Drug use: No  . Sexual activity: Not Currently   Other Topics Concern  . Not on file   Social History Narrative  . No narrative on file     Review of Systems General:  No chills, fever, night sweats or weight changes.  Cardiovascular:  No chest pain, dyspnea on exertion, edema, orthopnea, palpitations, paroxysmal nocturnal dyspnea. Dermatological: No rash, lesions/masses Respiratory: No cough, dyspnea Urologic: No hematuria, dysuria Abdominal:   No nausea, vomiting, diarrhea, bright red blood per rectum, melena, or hematemesis Neurologic:  No visual changes, wkns, changes in mental status. All other systems reviewed and are otherwise negative except as noted above.  Physical Exam  Blood pressure 116/67, pulse (!) 109, temperature 98.2 F (36.8 C), temperature source Oral, resp. rate 18, height '5\' 10"'$  (1.778 m), weight 197 lb 8 oz (89.6 kg), SpO2 95 %.  General: Pleasant, NAD, elderly Psych: Normal affect. Neuro: Alert and oriented X 3.  Moves all extremities spontaneously. HEENT: Normal  Neck: Supple without bruits or JVD. Lungs:  Resp regular and unlabored, CTA. Heart: RRR no s3, s4, or murmurs. Abdomen: Soft, non-tender, non-distended, BS + x 4.  Extremities: No clubbing,  cyanosis or edema. DP/PT/Radials 2+ and equal bilaterally.  Labs  Troponin (Point of Care Test) No results for input(s): TROPIPOC in the last 72 hours.  Recent Labs  11/28/15 1356  TROPONINI 0.95*   Lab Results  Component Value Date   WBC 9.4 11/25/2015   HGB 9.5 (L) 11/25/2015   HCT 32.8 (L) 11/25/2015   MCV 93.7 11/25/2015   PLT 261 11/25/2015    Recent Labs Lab 11/28/15 0219  NA 137  K 4.2  CL 85*  CO2 46*  BUN 39*  CREATININE 1.30*  CALCIUM 9.1  GLUCOSE 175*   Lab Results  Component Value Date   CHOL 238 (H) 05/18/2011   HDL 36 (L) 05/18/2011   LDLCALC 144 (H) 05/18/2011   TRIG 291 (H) 05/18/2011   Lab Results  Component Value Date   DDIMER (H) 01/19/2010    2.70        AT THE INHOUSE ESTABLISHED CUTOFF VALUE OF 0.48 ug/mL FEU, THIS ASSAY HAS BEEN DOCUMENTED IN THE LITERATURE TO HAVE A SENSITIVITY AND NEGATIVE PREDICTIVE VALUE OF AT LEAST 98 TO 99%.  THE TEST RESULT SHOULD BE CORRELATED WITH AN ASSESSMENT OF THE CLINICAL PROBABILITY OF DVT / VTE.     Radiology/Studies  Dg Chest 2 View  Result Date: 11/26/2015 CLINICAL DATA:  Shortness of breath. EXAM: CHEST  2 VIEW COMPARISON:  November 25, 2015 FINDINGS: The patient is status post left pneumonectomy. The left chest is stable. Hyperexpansion of the right lung. No pneumothorax. Mild interstitial prominence with no focal infiltrate, nodule, or mass. No other interval changes. IMPRESSION: 1. Hyperexpansion of the right lung, stable. Suggested pulmonary venous congestion without overt edema. No focal infiltrate. Electronically Signed   By: Dorise Bullion III M.D   On: 11/26/2015 08:10   Dg Chest 2 View  Result Date: 11/25/2015 CLINICAL DATA:  Shortness of breath  for several days. EXAM: CHEST  2 VIEW COMPARISON:  10/27/2015 FINDINGS: Previous median sternotomy and CABG procedure. Postoperative appearance of the left hemi thorax compatible with previous pneumonectomy. There is a small right pleural effusion and mild interstitial edema. IMPRESSION: 1. Suspect mild CHF. 2. Left pneumonectomy. Electronically Signed   By: Kerby Moors M.D.   On: 11/25/2015 13:46   Dg Chest Port 1 View  Result Date: 11/27/2015 CLINICAL DATA:  Shortness of breath for 2 days EXAM: PORTABLE CHEST 1 VIEW COMPARISON:  11/26/2015 FINDINGS: Right lung is again hyperinflated. The left lung again demonstrates significant calcific changes along the pleural margin with opacification consistent with prior pneumonectomy. No new focal abnormality is seen. IMPRESSION: Changes of prior left pneumonectomy. Hyper expansion of the right lung is noted. Electronically Signed   By: Inez Catalina M.D.   On: 11/27/2015 16:01    ECG  Chronic atrial flutter. CVR with 4:1 conduction.   Echocardiogram 10-27-15  Study Conclusions  - Left ventricle: The cavity size was normal. Systolic function was   normal. The estimated ejection fraction was in the range of 60%   to 65%. Wall motion was normal; there were no regional wall   motion abnormalities. There was an increased relative   contribution of atrial contraction to ventricular filling.   Doppler parameters are consistent with abnormal left ventricular   relaxation (grade 1 diastolic dysfunction). Doppler parameters   are consistent with high ventricular filling pressure. - Aortic valve: Moderate focal thickening and calcification   involving the noncoronary cusp. - Aorta: Aortic root dimension: 44 mm (ED). - Aortic root: The aortic  root was mildly dilated. - Mitral valve: Peak A-wave velocity: 157 cm/s. - Tricuspid valve: There was trivial regurgitation. - Pulmonic valve: There was trivial regurgitation. - Pulmonary arteries: PA peak pressure:  44 mm Hg (S).  Impressions:  - The right ventricular systolic pressure was increased consistent   with moderate pulmonary hypertension.    ASSESSMENT AND PLAN  Principal Problem:   Acute diastolic heart failure (HCC) Active Problems:   HTN (hypertension)   History of lung cancer   CAD (coronary artery disease), CABG 2007   Diabetes mellitus (HCC)   COPD (chronic obstructive pulmonary disease) (HCC)   Hx of pneumonectomy   Uncontrolled atrial fibrillation/flutter   Hyperlipidemia   Thrombocytopenia (HCC)   Acute on chronic respiratory failure with hypoxia and hypercarbia (HCC)   Pulmonary hypertension (HCC)   Acute on chronic diastolic CHF (congestive heart failure) (HCC)   COPD exacerbation ([place])   Palliative care encounter   Encounter for hospice care discussion   Slow transit constipation   Pain in the chest   1. Chest Pain + Elevated Troponin: This occurred in the setting of atrial flutter with a rapid ventricular response in the 120s-140s. CP resolved with normalization of ventricular rate. No further recurrence. Initial troponin was 0.95. He is currently CP free. Ventricular rate is now well controlled in the 70s. He has known CAD s/p CABG x 1 per Dr. Roxy Horseman over 10 years with patent SVR-RCA on cath in 2011. Due to advanced age/ declining health, he has made decision to be DNR and to go home with [facility]. He and his family are clear that they do not want any invasive procedures. Recommend continued medical therapy for single vessel CAD + rate control therapy for chronic atrial flutter + continuation of Eliqius for stroke prophylaxis.    Signed, Lyda Jester, PA-C 11/28/2015, 4:00 PM  I have examined the patient and reviewed assessment and plan and discussed with patient.  Agree with above as stated.  Experienced atrial flutter sx over the past few weeks.  Diltiazem increased to 240 mg daily.  At discharge, would have to switch to Dilt CD 240 mg daily.  Would  also consider sending home with a 30 mg short acting diltiazem tab that can be used prn palpitations.  Larae Grooms

## 2015-11-28 NOTE — Progress Notes (Signed)
Daily Progress Note   Patient Name: Calvin [name]       Date: 11/28/2015 DOB: 11-Dec-1932  Age: 82 y.o. MRN#: [id number] Attending Physician: Orson Eva, MD Primary Care Physician:  [name], MD Admit Date: 11/25/2015  Reason for Consultation/Follow-up: Establishing goals of care and Hospice Evaluation  Subjective: Patient comfortable sitting up in bed. Wife at bedside. Denies pain, SOB, or any discomfort. Patient dangled on the side of bed with physical therapy but did not ambulate due to sustaining HR in the 150's. Patient denied any symptoms during that episode. Wife states he got up twice yesterday to the bedside commode with no concerns.  Discussed home hospice versus rehab/skilled nursing facility again with wife. She is adamant to take him home stating she will be able to take care of him with help from home hospice, despite having her own medical problems and double knee replacements. She has many questions to ask the hospice representative, who should be visiting this afternoon.   Length of Stay: 2  Current Medications: Scheduled Meds:  . apixaban  5 mg Oral BID  . aspirin  324 mg Oral Once  . carvedilol  12.5 mg Oral BID  . cholecalciferol  2,000 Units Oral BID  . diltiazem  180 mg Oral QHS  . docusate sodium  100 mg Oral BID  . furosemide  40 mg Oral Daily  . insulin aspart  0-5 Units Subcutaneous QHS  . insulin aspart  0-9 Units Subcutaneous TID WC  . ipratropium-albuterol  3 mL Nebulization TID  . losartan  25 mg Oral Daily  . magnesium oxide  400 mg Oral QHS  . multivitamin with minerals  1 tablet Oral Daily  . omega-3 acid ethyl esters  1 g Oral BID  . polyethylene glycol  17 g Oral Daily  . predniSONE  50 mg Oral Q breakfast  . sodium chloride flush  3 mL Intravenous Q12H      Continuous Infusions:    PRN Meds: sodium chloride, acetaminophen, ipratropium-albuterol, loratadine, ondansetron (ZOFRAN) IV, sodium chloride flush  Physical Exam  Constitutional: He is oriented to person, place, and time. He appears well-developed and well-nourished. He is cooperative.  Hearing impaired.   Cardiovascular: S1 normal, S2 normal and normal heart sounds.  A regularly irregular rhythm present.  Pulses:  Radial pulses are 2+ on the right side, and 2+ on the left side.       Posterior tibial pulses are 2+ on the right side, and 2+ on the left side.  Aflutter on tele.  Pulmonary/Chest: Effort normal. No accessory muscle usage. No tachypnea. No respiratory distress. He has decreased breath sounds.  Abdominal: Soft. Bowel sounds are normal. He exhibits no distension and no mass. There is no tenderness.  Neurological: He is alert and oriented to person, place, and time. GCS eye subscore is 4. GCS verbal subscore is 5. GCS motor subscore is 6.  Skin: Skin is warm, dry and intact.  Psychiatric: He has a normal mood and affect. His speech is normal and behavior is normal. Cognition and memory are normal.  Nursing note and vitals reviewed.           Vital Signs: BP (!) 105/51 (BP Location: Right Arm)   Pulse 75   Temp 98 F (36.7 C) (Oral)   Resp 18   Ht '5\' 10"'$  (1.778 m)   Wt 89.6 kg (197 lb 8 oz) Comment: scale a  SpO2 96%   BMI 28.34 kg/m  SpO2: SpO2: 96 % O2 Device: O2 Device: Nasal Cannula O2 Flow Rate: O2 Flow Rate (L/min): 2 L/min  Intake/output summary:  Intake/Output Summary (Last 24 hours) at 11/28/15 1055 Last data filed at 11/28/15 0849  Gross per 24 hour  Intake              480 ml  Output             1100 ml  Net             -620 ml   LBM: Last BM Date: 11/27/15 Baseline Weight: Weight: 90.4 kg (199 lb 4.7 oz) (scale A) Most recent weight: Weight: 89.6 kg (197 lb 8 oz) (scale a)       Palliative Assessment/Data: PPS 40%   Flowsheet Rows    Flowsheet Row Most Recent Value  Intake Tab  Referral Department  Hospitalist  Unit at Time of Referral  Cardiac/Telemetry Unit  Palliative Care Primary Diagnosis  Pulmonary  Date Notified  11/26/15  Palliative Care Type  New Palliative care  Reason for referral  Clarify Goals of Care  Date of Admission  11/25/15  Date first seen by Palliative Care  11/27/15  # of days Palliative referral response time  1 Day(s)  # of days IP prior to Palliative referral  1  Clinical Assessment  Palliative Performance Scale Score  40%  Psychosocial & Spiritual Assessment  Palliative Care Outcomes  Patient/Family meeting held?  Yes  Who was at the meeting?  Patient and wife  Palliative Care Outcomes  Clarified goals of care, Counseled regarding hospice, Changed CPR status  Patient/Family wishes: Interventions discontinued/not started   Mechanical Ventilation      Patient Active Problem List   Diagnosis Date Noted  . Palliative care encounter   . Encounter for hospice care discussion   . Slow transit constipation   . Acute on chronic diastolic CHF (congestive heart failure) ([place]) 11/26/2015  . COPD exacerbation ([place]) 11/26/2015  . Pulmonary hypertension ([place]) 11/25/2015  . Acute diastolic heart failure ([place]) 11/25/2015  . Encephalopathy   . Thrombocytopenia ([place]) 10/26/2015  . Acute on chronic respiratory failure with hypoxia and hypercarbia ([place]) 10/26/2015  . Acute respiratory failure with hypercapnia ([place]) 10/26/2015  . Chronic respiratory failure with hypoxia ([place]) 08/28/2015  . UGI bleed 07/25/2015  .  Sinus drainage 02/27/2015  . Hyperlipidemia 01/11/2015  . Chronic anticoagulation-Xarelto prescribed 10/27/14 11/04/2014  . Hypercapnic respiratory failure, chronic ([place]) 03/21/2014  . Arteriosclerosis of coronary artery 01/10/2014  . Uncontrolled atrial fibrillation/flutter 01/10/2014  . Aftercare following surgery of the circulatory system, [place] 11/09/2013  . COPD (chronic obstructive  pulmonary disease) ([place]) 09/03/2013  . Hx of pneumonectomy 09/03/2013  . Movement disorder 09/02/2013  . Acute-on-chronic respiratory failure ([place]) 09/02/2013  . Occlusion and stenosis of carotid artery without mention of cerebral infarction 11/07/2011  . Numbness and tingling of left arm and leg 05/17/2011  . Diabetes mellitus ([place]) 05/17/2011  . HTN (hypertension)   . History of lung cancer   . Hypertriglyceridemia   . CAD (coronary artery disease), CABG 2007   . Atrial myxoma, resection 2007   . Carotid artery disease ([place])   . Hearing loss     Palliative Care Assessment & Plan   Patient Profile: 82 y.o. male  with past medical history of atrial fib/flutter, COPD s/p left pneumonectomy, lung cancer, chronic hypoxemic and hypercarbic respiratory failure on 2L home oxygen, diabetes, hypertension, pulmonary hypertension, CAD, chronic diastolic heart failure, dyslipidemia, and chronic thrombocytopenia. Patient admitted 11/25/2015 with increasing SOB. Wife called his Cardiologist asking to increase his home oxygen but was instructed to be evaluated in the ER. Patient was recently discharged on 7/8 after a diagnosis of respiratory failure requiring Bipap. Patient admitted with acute on chronic CHF and COPD exacerbation. He has been diuresed and continuing home medications.   Assessment:  Awake and alert. Comfortably sitting up in bed.   Recommendations/Plan:  Pending hospice evaluation from [facility].  Hx of constipation. LBM 11/28/15. Continue daily Miralax.  Dyspnea: Continue supportive oxygen via nasal cannula (2L).  Patient denies pain. Will follow-up as needed.   Plan for home with hospice.  Goals of Care and Additional Recommendations:  Limitations on Scope of Treatment: Avoid Hospitalization and Minimize Medications  Code Status: DNR   Code Status Orders        Start     Ordered   11/27/15 1119  Do not attempt resuscitation (DNR)  Continuous    Question  Answer Comment  In the event of cardiac or respiratory ARREST Do not call a "code blue"   In the event of cardiac or respiratory ARREST Do not perform Intubation, CPR, defibrillation or ACLS   In the event of cardiac or respiratory ARREST Use medication by any route, position, wound care, and other measures to relive pain and suffering. May use oxygen, suction and manual treatment of airway obstruction as needed for comfort.      11/27/15 1122    Code Status History    Date Active Date Inactive Code Status Order ID Comments User Context   11/25/2015  6:16 PM 11/27/2015 11:22 AM Full Code [id number]  Samella Parr, NP Inpatient   10/26/2015  3:24 PM 10/29/2015  4:34 PM Full Code [id number]  Samella Parr, NP ED   07/25/2015  7:37 PM 07/31/2015  4:52 PM Full Code [id number]  [name], DO ED   11/03/2014  5:42 PM 11/04/2014  4:09 PM Full Code [id number]  Isaiah Serge, NP ED   09/04/2013  9:29 AM 09/06/2013  6:45 PM DNR [id number]  Elsie Stain, MD Inpatient   09/02/2013  5:36 PM 09/04/2013  9:29 AM Full Code [id number]  Nita Sells, MD Inpatient       Prognosis:   < 6 months in the setting  of advanced COPD, acute on chronic CHF, and multiple hospitalizations.   Discharge Planning:  Home with Hospice  Care plan was discussed with Patient and wife.   Thank you for allowing the Palliative Medicine Team to assist in the care of this patient.   Time In: 1000 Time Out: 1030 Total Time 44mn Prolonged Time Billed  no       Greater than 50%  of this time was spent counseling and coordinating care related to the above assessment and plan.  MBasilio Cairo NP MImogene Burn PA-C  Please contact Palliative Medicine Team phone at [phone] questions and concerns.

## 2015-11-28 NOTE — Consult Note (Signed)
   [facility] CM Inpatient Consult   11/28/2015  Calvin [name] 01-13-1933 [id number]  Patient's chart reviewed and screened for [facility] services. Patient is eligible for services through his Medicare/ [employer] Registry benefits.  Patient is an 82 y.o. male  with history of atrial fib/flutter, COPD s/p left pneumonectomy, lung cancer, chronic hypoxemic and hypercarbic respiratory failure on 2L home oxygen, diabetes, hypertension, pulmonary hypertension, CAD, chronic diastolic heart failure, dyslipidemia, and chronic thrombocytopenia. Patient admitted on 11/25/2015 with increasing SOB. Wife had called his Cardiologist asking to increase his home oxygen but was instructed to be evaluated in the ER. Patient was recently discharged on 7/8 after a diagnosis of respiratory failure requiring Bipap. Patient was admitted with acute on chronic CHF and COPD exacerbation. He has been diuresed and continuing home medications. Chart review reveals that patient is going to be discharged with hospice services. No identified needs from [facility] at this time.  Roseanna Koplin A. Precious Segall, BSN, RN-BC [facility] PRIMARY CARE Navigator Cell: [phone]

## 2015-11-28 NOTE — Progress Notes (Signed)
PROGRESS NOTE  Calvin [name] EGB:[id number] DOB: 05/08/1932 DOA: 11/25/2015 PCP:  [name], MD  Brief History: 82 y/o male with history of lung cancer s/p Left pneumonectomy in 2000, COPD, CAD status post CABG 10 years ago, atrial fibrillation, chronic respiratory failure on 1.5 L, diabetesmellitus, hypertension, hyperlipidemia presented with two-week history of increasing shortness of breath. The patient was recently discharged from the hospital after a stay from 10/26/2015 through 10/28/2015 for acute on chronic respiratory failure secondary to COPD exacerbation and acute metabolic encephalopathy. He required BiPAP during that admission and was discharged home on 2 L nasal cannula. In the past week, the patient has noted increasing palpitations particularly with activity. Apparently his home health RN noted his heart rate up to the 130s, but improves with rest. On 11/22/2015 the patient began having increasing shortness of breath without improvement despite using his nebulizer treatments. Apparently the patient had a weight of 207and his primary care provider's office this past week. His discharge weight on 10/28/2015 was 201 pounds.Workup in the emergency department reveal a chest x-ray that showed vascular congestion. The patient was given furosemide 80 mg IV 1 and Solu-Medrol 125 mg IV 1. Medical records reviewed and summarized.  Assessment/Plan: Acute on chronic respiratory failure with hypoxia and hypercarbia -Although pulmonary edema/CHF may be playing a role in his shortness of breath I feel this is likely multifactorial including his A. fib with RVR and likely COPD exacerbation contributing -His admission weight is less than his discharge weight 1 month ago -Presently stable on 2 L nasal cannula--95-97 percent saturation -consult palliative medicine for GOC--Dr. Titus Mould previously stated pt would not do well if had to be intubated  Acute on chronic diastolic CHF -R>L  heart failure -NEG 3.4 L since admission. -10/28/2015 discharge weight 201 pounds -11/25/2015 admission weight 199 pounds -Monitor renal function -Daily weights -10/27/2015 echocardiogram EF 60-65%, grade 1 DD, PAP44 -11/27/15 personally reviewed CXR--persistent interstitial markings on R -switch to po lasix-->clinically euvolemic  COPD exacerbation -Continue by mouth prednisone--continue to wean ('50mg'$ -->40 in am) -Continue bronchodilators  Paroxysmal atrial fibrillation/flutter -HR up to 110-->hold d/c for today -Continue carvedilol and diltiazem -switch to short acting diltiazem and increase dose -Continue apixaban -10/26/2015 TSH 0.800 -EKG today  Atypical chest discomfort -EKG -cycle troponins -likely due to increased HR 8/8  Hypertension -Controlled -Continue carvedilol and losartan  Coronary artery disease -No anginal symptoms -personally reviewed EKG--atrial flutter, nonspecific ST changes -Continue aspirin  Diabetes mellitus type 2 -CBG is elevated secondary to steroids -Discontinue metformin -10/26/2015: A1c 6.2 -Allow for liberalglycemic control  Renal artery stenosis  -This has likely been a chronic issue  -Follow up with [facility] in outpt setting -this is contributing to his labile HTN  Hyperlipidemia  -Continue statin   Gait instability and headache -10/27/15--MRI brain--neg for acute findings  History lung cancer status post left pneumonectomy    Disposition Plan: Home 11/28/15 if stable  Family Communication: wife updated at bedside 11/28/15--Total time spent 35 minutes. Greater than 50% spent face to face counseling and coordinating care.   Consultants: none  Code Status: FULL  DVT Prophylaxis:apixaban   Procedures: As Listed in Progress Note Above  Antibiotics: None  Subjective: Patient complains of some chest discomfort this morning. States that his breathing is back to normal. Denies any fevers, chills,  headache, nausea, vomiting, diarrhea, abdominal pain. No dysuria or hematuria. No rashes.  Objective: Vitals:   11/27/15 2019 11/28/15 0534 11/28/15 1006 11/28/15  1142  BP:  (!) 105/51  116/67  Pulse:  75  (!) 109  Resp:  18  18  Temp:  98 F (36.7 C)  98.2 F (36.8 C)  TempSrc:  Oral  Oral  SpO2: 97% 100% 96% 95%  Weight:  89.6 kg (197 lb 8 oz)    Height:        Intake/Output Summary (Last 24 hours) at 11/28/15 1149 Last data filed at 11/28/15 1107  Gross per 24 hour  Intake              480 ml  Output             1525 ml  Net            -1045 ml   Weight change: -0.454 kg (-1 lb) Exam:   General:  Pt is alert, follows commands appropriately, not in acute distress  HEENT: No icterus, No thrush, No neck mass, [place]/AT  Cardiovascular: IRRR, S1/S2, no rubs, no gallops  Respiratory: Scattered bilateral basilar rales. No wheezing. Good air movement.  Abdomen: Soft/+BS, non tender, non distended, no guarding  Extremities: No edema, No lymphangitis, No petechiae, No rashes, no synovitis   Data Reviewed: I have personally reviewed following labs and imaging studies Basic Metabolic Panel:  Recent Labs Lab 11/25/15 1210 11/25/15 1835 11/26/15 0416 11/27/15 0328 11/28/15 0219  NA 136  --  138 134* 137  K 4.4  --  3.8 3.9 4.2  CL 90*  --  83* 84* 85*  CO2 41*  --  45* 47* 46*  GLUCOSE 141*  --  159* 176* 175*  BUN 18  --  26* 32* 39*  CREATININE 1.21  --  1.39* 1.27* 1.30*  CALCIUM 9.5  --  9.7 9.3 9.1  MG  --  2.1  --  2.6*  --    Liver Function Tests: No results for input(s): AST, ALT, ALKPHOS, BILITOT, PROT, ALBUMIN in the last 168 hours. No results for input(s): LIPASE, AMYLASE in the last 168 hours. No results for input(s): AMMONIA in the last 168 hours. Coagulation Profile: No results for input(s): INR, PROTIME in the last 168 hours. CBC:  Recent Labs Lab 11/25/15 1210  WBC 9.4  HGB 9.5*  HCT 32.8*  MCV 93.7  PLT 261   Cardiac Enzymes: No  results for input(s): CKTOTAL, CKMB, CKMBINDEX, TROPONINI in the last 168 hours. BNP: Invalid input(s): POCBNP CBG:  Recent Labs Lab 11/27/15 0600 11/27/15 1144 11/27/15 1636 11/27/15 2226 11/28/15 0526  GLUCAP 167* 198* 118* 164* 147*   HbA1C: No results for input(s): HGBA1C in the last 72 hours. Urine analysis:    Component Value Date/Time   COLORURINE YELLOW 10/26/2015 1031   APPEARANCEUR CLEAR 10/26/2015 1031   LABSPEC 1.024 10/26/2015 1031   PHURINE 6.5 10/26/2015 1031   GLUCOSEU NEGATIVE 10/26/2015 1031   HGBUR SMALL (A) 10/26/2015 1031   BILIRUBINUR NEGATIVE 10/26/2015 1031   KETONESUR NEGATIVE 10/26/2015 1031   PROTEINUR >300 (A) 10/26/2015 1031   UROBILINOGEN 0.2 06/02/2007 2336   NITRITE NEGATIVE 10/26/2015 1031   LEUKOCYTESUR NEGATIVE 10/26/2015 1031   Sepsis Labs: '@LABRCNTIP'$ (procalcitonin:4,lacticidven:4) )No results found for this or any previous visit (from the past 240 hour(s)).   Scheduled Meds: . apixaban  5 mg Oral BID  . aspirin  324 mg Oral Once  . carvedilol  12.5 mg Oral BID  . cholecalciferol  2,000 Units Oral BID  . diltiazem  30 mg Oral Q6H  . diltiazem  60 mg Oral Q6H  . docusate sodium  100 mg Oral BID  . furosemide  40 mg Oral Daily  . insulin aspart  0-5 Units Subcutaneous QHS  . insulin aspart  0-9 Units Subcutaneous TID WC  . ipratropium-albuterol  3 mL Nebulization TID  . losartan  25 mg Oral Daily  . magnesium oxide  400 mg Oral QHS  . multivitamin with minerals  1 tablet Oral Daily  . omega-3 acid ethyl esters  1 g Oral BID  . polyethylene glycol  17 g Oral Daily  . predniSONE  50 mg Oral Q breakfast  . sodium chloride flush  3 mL Intravenous Q12H   Continuous Infusions:   Procedures/Studies: Dg Chest 2 View  Result Date: 11/26/2015 CLINICAL DATA:  Shortness of breath. EXAM: CHEST  2 VIEW COMPARISON:  November 25, 2015 FINDINGS: The patient is status post left pneumonectomy. The left chest is stable. Hyperexpansion of the  right lung. No pneumothorax. Mild interstitial prominence with no focal infiltrate, nodule, or mass. No other interval changes. IMPRESSION: 1. Hyperexpansion of the right lung, stable. Suggested pulmonary venous congestion without overt edema. No focal infiltrate. Electronically Signed   By: Dorise Bullion III M.D   On: 11/26/2015 08:10   Dg Chest 2 View  Result Date: 11/25/2015 CLINICAL DATA:  Shortness of breath for several days. EXAM: CHEST  2 VIEW COMPARISON:  10/27/2015 FINDINGS: Previous median sternotomy and CABG procedure. Postoperative appearance of the left hemi thorax compatible with previous pneumonectomy. There is a small right pleural effusion and mild interstitial edema. IMPRESSION: 1. Suspect mild CHF. 2. Left pneumonectomy. Electronically Signed   By: Kerby Moors M.D.   On: 11/25/2015 13:46   Dg Chest Port 1 View  Result Date: 11/27/2015 CLINICAL DATA:  Shortness of breath for 2 days EXAM: PORTABLE CHEST 1 VIEW COMPARISON:  11/26/2015 FINDINGS: Right lung is again hyperinflated. The left lung again demonstrates significant calcific changes along the pleural margin with opacification consistent with prior pneumonectomy. No new focal abnormality is seen. IMPRESSION: Changes of prior left pneumonectomy. Hyper expansion of the right lung is noted. Electronically Signed   By: Inez Catalina M.D.   On: 11/27/2015 16:01    Eileen Kangas, DO  [facility] Pager [phone]  If 7PM-7AM, please contact night-coverage [email or web address] Password [email or web address] 11/28/2015, 11:49 AM   LOS: 2 days

## 2015-11-28 NOTE — Evaluation (Addendum)
Physical Therapy Evaluation Patient Details Name: Calvin [name] MRN: [id number] DOB: [birth year] Today's Date: 11/28/2015   History of Present Illness  82 y.o.malewith medical history significant forlong-standing atrial fib and atrial flutter, COPD status post left pneumonectomy lung cancer, chronic hypoxemic and hypercarbic respiratory failure on 2 L oxygen at home, diabetes, hypertension, pulmonary hypertension, CAD, chronic diastolic heart failure, and chronic thrombocytopenia. Patient was recently discharged on 7/8 after anadmission for acute respiratory failure with hypoxemia and hypercarbia requiring transient BiPAP treatment. Since discharge patient developing increasing edema symptoms. Patient and wife have noticed patient having palpitations especially with activity and home health RN has documented heart rates up into the 130s with activity but quickly reduces down with rest. Cardiologist was notified by home health RN and instructions were to increase patient's diltiazem dosage if the tachycardia was sustained for greater than or equal to 1 hour. Patient has had recurrent episodes of nonsustained tachycardia for the past several weeks. He was so short of breath today the wife called the cardiology office to ask if she could turn the oxygen up and instead was instructed to be evaluated at the ER.     Clinical Impression  Pt admitted with above diagnosis. Did not attempt ambulation as patient's HR increased to 150s sustained (max 158) x 5 minutes sitting EOB. Offered patient up to chair via stand-pivot and he stated he just wanted to lie down. Noted ?ability to discharge home with Hospice vs other disposition. If HR continues to be tachycardic, ? if patient will be primarily bedbound and if pt/wife would choose to return home at this level. Pt currently with functional limitations due to the deficits listed below (see PT Problem List).  Pt may benefit from skilled PT to increase their independence  and safety with mobility to allow discharge home with Hospice    Follow Up Recommendations Other (comment) (TBD as further function can be assessed)    Equipment Recommendations  None recommended by PT    Recommendations for Other Services       Precautions / Restrictions Precautions Precautions: Fall Precaution Comments: monitor HR for rapid afib Restrictions Weight Bearing Restrictions: No      Mobility  Bed Mobility Overal bed mobility: Modified Independent             General bed mobility comments: HOB 15 without rail; easily manipulated covers and came to EOB sitting; easily returned to supine  Transfers                 General transfer comment: unable due to HR sustained 150s  Ambulation/Gait             General Gait Details: unable due to HR sustained 150s  Stairs            Wheelchair Mobility    Modified Rankin (Stroke Patients Only)       Balance Overall balance assessment: Independent (only sittting assessed)                                           Pertinent Vitals/Pain HR max 158, sustained 150s  SaO2 97% on 2L with mild dyspnea  Pain Assessment: No/denies pain    Home Living Family/patient expects to be discharged to:: Private residence Living Arrangements: Spouse/significant other Available Help at Discharge: Family;Available 24 hours/day Type of Home: House Home Access: Stairs to enter Entrance Stairs-Rails: None  Entrance Stairs-Number of Steps: 1 Home Layout: One level Home Equipment: Grab bars - tub/shower;Cane - single point;Walker - 2 wheels;Toilet riser      Prior Function Level of Independence: Independent with assistive device(s)         Comments: Walking short distances in home with cane and 2L O2     Hand Dominance        Extremity/Trunk Assessment   Upper Extremity Assessment: Overall WFL for tasks assessed           Lower Extremity Assessment: Overall WFL for  tasks assessed      Cervical / Trunk Assessment: Normal  Communication   Communication: HOH  Cognition Arousal/Alertness: Awake/alert Behavior During Therapy: WFL for tasks assessed/performed Overall Cognitive Status: Within Functional Limits for tasks assessed                      General Comments      Exercises        Assessment/Plan    PT Assessment Patient needs continued PT services  PT Diagnosis Difficulty walking   PT Problem List Decreased activity tolerance;Decreased mobility;Cardiopulmonary status limiting activity;Obesity  PT Treatment Interventions DME instruction;Gait training;Functional mobility training;Therapeutic activities;Patient/family education   PT Goals (Current goals can be found in the Care Plan section) Acute Rehab PT Goals Patient Stated Goal: to go home PT Goal Formulation: With patient Time For Goal Achievement: 12/05/15 Potential to Achieve Goals: Fair    Frequency Min 3X/week   Barriers to discharge Decreased caregiver support elderly wife; considering home with Hospice support    Co-evaluation               End of Session Equipment Utilized During Treatment: Oxygen Activity Tolerance: Treatment limited secondary to medical complications (Comment) (HR up to 150s sustained; down to 130s after 20 minutes) Patient left: in bed;with call bell/phone within reach;with bed alarm set Nurse Communication: Other (comment) (elevated HR)         Time: 8867-7373 PT Time Calculation (min) (ACUTE ONLY): 16 min   Charges:   PT Evaluation $PT Eval Low Complexity: 1 Procedure     PT G Codes:        Calvin [name] 12-02-15, 9:31 AM  Pager [phone]

## 2015-11-29 DIAGNOSIS — I209 Angina pectoris, unspecified: Secondary | ICD-10-CM

## 2015-11-29 DIAGNOSIS — Z85118 Personal history of other malignant neoplasm of bronchus and lung: Secondary | ICD-10-CM

## 2015-11-29 DIAGNOSIS — I2510 Atherosclerotic heart disease of native coronary artery without angina pectoris: Secondary | ICD-10-CM

## 2015-11-29 LAB — GLUCOSE, CAPILLARY
GLUCOSE-CAPILLARY: 171 mg/dL — AB (ref 65–99)
GLUCOSE-CAPILLARY: 104 mg/dL — AB (ref 65–99)
GLUCOSE-CAPILLARY: 165 mg/dL — AB (ref 65–99)

## 2015-11-29 LAB — TROPONIN I: TROPONIN I: 1.02 ng/mL — AB (ref <0.03)

## 2015-11-29 MED ORDER — FUROSEMIDE 40 MG PO TABS: 40.0000 mg | ORAL_TABLET | Freq: Every day | ORAL | 2 refills | Status: AC

## 2015-11-29 MED ORDER — DILTIAZEM HCL 30 MG PO TABS: 30.0000 mg | ORAL_TABLET | Freq: Four times a day (QID) | ORAL | 2 refills | Status: AC | PRN

## 2015-11-29 MED ORDER — DILTIAZEM HCL ER COATED BEADS 120 MG PO CP24
120.0000 mg | ORAL_CAPSULE | Freq: Two times a day (BID) | ORAL | 2 refills | Status: AC
Start: 2015-11-29 — End: ?

## 2015-11-29 MED ORDER — PREDNISONE 10 MG PO TABS: ORAL_TABLET | ORAL | 0 refills | Status: AC

## 2015-11-29 MED ORDER — DILTIAZEM HCL ER COATED BEADS 120 MG PO CP24
120.0000 mg | ORAL_CAPSULE | Freq: Two times a day (BID) | ORAL | Status: DC
  Administered 2015-11-29: 120 mg via ORAL
  Filled 2015-11-29: qty 1

## 2015-11-29 MED ORDER — LOSARTAN POTASSIUM 25 MG PO TABS: 25.0000 mg | ORAL_TABLET | Freq: Every day | ORAL | 2 refills | Status: AC

## 2015-11-29 NOTE — Progress Notes (Addendum)
Patient Name: Calvin [name] Date of Encounter: 11/29/2015  Primary Cardiologist: Dr. Irish Lack  Pt. Profile:  82 y/o male with h/o CAD s/p remote CABG x 1, chronic diastolic HF, chronic atrial flutter on Eliquis, COPD, remote h/o lung cancer, chronic respiratory failure requiring home O2, admitted for a/c diastolic CHF exacerbation. During admit patient developed CP, in the setting of rapid Aflutter with elevated troponin of 0.95. Note: Patient recently made decision to be DNR and plans to be discharged home with Hospice Services. Consult placed per family request.     Principal Problem:   Acute diastolic heart failure ([place]) Active Problems:   HTN (hypertension)   History of lung cancer   CAD (coronary artery disease), CABG 2007   Diabetes mellitus ([facility])   COPD (chronic obstructive pulmonary disease) ([facility])   Hx of pneumonectomy   Uncontrolled atrial fibrillation/flutter   Hyperlipidemia   Thrombocytopenia ([facility])   Acute on chronic respiratory failure with hypoxia and hypercarbia ([facility])   Pulmonary hypertension ([facility])   Acute on chronic diastolic CHF (congestive heart failure) (HCC)   COPD exacerbation ([place])   Palliative care encounter   Encounter for hospice care discussion   Slow transit constipation   Pain in the chest    SUBJECTIVE  Denies any CP or SOB.   CURRENT MEDS . apixaban  5 mg Oral BID  . aspirin  324 mg Oral Once  . carvedilol  12.5 mg Oral BID  . cholecalciferol  2,000 Units Oral BID  . diltiazem  60 mg Oral Q6H  . docusate sodium  100 mg Oral BID  . furosemide  40 mg Oral Daily  . insulin aspart  0-5 Units Subcutaneous QHS  . insulin aspart  0-9 Units Subcutaneous TID WC  . ipratropium-albuterol  3 mL Nebulization TID  . losartan  25 mg Oral Daily  . magnesium oxide  400 mg Oral QHS  . multivitamin with minerals  1 tablet Oral Daily  . omega-3 acid ethyl esters  1 g Oral BID  . polyethylene glycol  17 g Oral Daily  . predniSONE  40 mg Oral Q breakfast    . sodium chloride flush  3 mL Intravenous Q12H    OBJECTIVE  Vitals:   11/28/15 1142 11/28/15 2010 11/28/15 2012 11/29/15 0505  BP: 116/67 (!) 103/59  (!) 150/67  Pulse: (!) 109 97  90  Resp: '18 18  18  '$ Temp: 98.2 F (36.8 C) 98.4 F (36.9 C)  98 F (36.7 C)  TempSrc: Oral Oral  Oral  SpO2: 95% 97% 97% 99%  Weight:    197 lb 12.8 oz (89.7 kg)  Height:        Intake/Output Summary (Last 24 hours) at 11/29/15 0943 Last data filed at 11/29/15 0909  Gross per 24 hour  Intake             1040 ml  Output             1116 ml  Net              -76 ml   Filed Weights   11/27/15 0600 11/28/15 0534 11/29/15 0505  Weight: 198 lb 8 oz (90 kg) 197 lb 8 oz (89.6 kg) 197 lb 12.8 oz (89.7 kg)    PHYSICAL EXAM  General: Pleasant, NAD. Neuro: Alert and oriented X 3. Moves all extremities spontaneously. Psych: Normal affect. HEENT:  Normal  Neck: Supple without bruits or JVD. Lungs:  Resp regular and unlabored, CTA.  Heart: regularly irregularly. no s3, s4, or murmurs. Abdomen: Soft, non-tender, non-distended, BS + x 4.  Extremities: No clubbing, cyanosis or edema. DP/PT/Radials 2+ and equal bilaterally.  Accessory Clinical Findings  CBC No results for input(s): WBC, NEUTROABS, HGB, HCT, MCV, PLT in the last 72 hours. Basic Metabolic Panel  Recent Labs  11/27/15 0328 11/28/15 0219  NA 134* 137  K 3.9 4.2  CL 84* 85*  CO2 47* 46*  GLUCOSE 176* 175*  BUN 32* 39*  CREATININE 1.27* 1.30*  CALCIUM 9.3 9.1  MG 2.6*  --    Cardiac Enzymes  Recent Labs  11/28/15 1356 11/28/15 1802 11/29/15 0024  TROPONINI 0.95* 0.97* 1.02*    TELE Aflutter with HR 70-80s, occasionally goes up to 120-140s    ECG  No new EKG  Echocardiogram 10/27/2015  LV EF: 60% -   65%  ------------------------------------------------------------------- Indications:      CHF - 428.0.  ------------------------------------------------------------------- History:   PMH:   Atrial flutter.   Coronary artery disease. Stroke.  Chronic obstructive pulmonary disease.  Risk factors: Hypertension. Diabetes mellitus.  ------------------------------------------------------------------- Study Conclusions  - Left ventricle: The cavity size was normal. Systolic function was   normal. The estimated ejection fraction was in the range of 60%   to 65%. Wall motion was normal; there were no regional wall   motion abnormalities. There was an increased relative   contribution of atrial contraction to ventricular filling.   Doppler parameters are consistent with abnormal left ventricular   relaxation (grade 1 diastolic dysfunction). Doppler parameters   are consistent with high ventricular filling pressure. - Aortic valve: Moderate focal thickening and calcification   involving the noncoronary cusp. - Aorta: Aortic root dimension: 44 mm (ED). - Aortic root: The aortic root was mildly dilated. - Mitral valve: Peak A-wave velocity: 157 cm/s. - Tricuspid valve: There was trivial regurgitation. - Pulmonic valve: There was trivial regurgitation. - Pulmonary arteries: PA peak pressure: 44 mm Hg (S).  Impressions:  - The right ventricular systolic pressure was increased consistent   with moderate pulmonary hypertension.    Radiology/Studies  Dg Chest 2 View  Result Date: 11/26/2015 CLINICAL DATA:  Shortness of breath. EXAM: CHEST  2 VIEW COMPARISON:  November 25, 2015 FINDINGS: The patient is status post left pneumonectomy. The left chest is stable. Hyperexpansion of the right lung. No pneumothorax. Mild interstitial prominence with no focal infiltrate, nodule, or mass. No other interval changes. IMPRESSION: 1. Hyperexpansion of the right lung, stable. Suggested pulmonary venous congestion without overt edema. No focal infiltrate. Electronically Signed   By: Dorise Bullion III M.D   On: 11/26/2015 08:10   Dg Chest 2 View  Result Date: 11/25/2015 CLINICAL DATA:  Shortness of breath for  several days. EXAM: CHEST  2 VIEW COMPARISON:  10/27/2015 FINDINGS: Previous median sternotomy and CABG procedure. Postoperative appearance of the left hemi thorax compatible with previous pneumonectomy. There is a small right pleural effusion and mild interstitial edema. IMPRESSION: 1. Suspect mild CHF. 2. Left pneumonectomy. Electronically Signed   By: Kerby Moors M.D.   On: 11/25/2015 13:46   Dg Chest Port 1 View  Result Date: 11/27/2015 CLINICAL DATA:  Shortness of breath for 2 days EXAM: PORTABLE CHEST 1 VIEW COMPARISON:  11/26/2015 FINDINGS: Right lung is again hyperinflated. The left lung again demonstrates significant calcific changes along the pleural margin with opacification consistent with prior pneumonectomy. No new focal abnormality is seen. IMPRESSION: Changes of prior left pneumonectomy. Hyper expansion of the right  lung is noted. Electronically Signed   By: Inez Catalina M.D.   On: 11/27/2015 16:01    ASSESSMENT AND PLAN  1. Chronic atrial flutter with RVR  - CHA2DS2 VASc score is 6 (HTN, Age >75, prior CVA and vascular disease. Previously on Xarelto, switched to eliquis after GI bleed  - Due to advanced age/ declining health, he has made decision to be DNR and to go home with Hospice Care. He and his family are clear that they do not want any invasive procedures.   - trop 0.95 --> 0.95 --> 1.02. Contiue rate control with careg, diltiazem and eliquis. I was going to change diltiazem to '240mg'$  diltiazem CD, but family is concerned that his previous cardizem CD was not giving him 24 hour HR control, will change cardizem CD to '120mg'$  BID for now. PRN short acting diltiazem '30mg'$  as needed for breakthrough tachycardia.   2. Elevated trop in the setting of tachycardia  3. CAD s/p remote CABG x 12 82 years old with resection of atrial myxoma  - last LHC was in 2011. He was found to have single vessel disease with an occluded RCA but patent SVG-RCA. EF was 65%.   - 2D echo 10/27/2015 EF  60-65%, grade 1 DD, PA peak pressure 32mg  4. COPD on chronic home O2  5. H/o carotid artery disease s/p R CEA 06/2005   Signed, HAlmyra DeforestPA-C Pager: [phone]  I have examined the patient and reviewed assessment and plan and discussed with patient.  Agree with above as stated.   Agree with above dosing changes, Cardizem CD 120 BID with additional med for breakthrough palpitations.  Can use short acting Cardizem 30 mg PO q 6 hours prn palpitations.  WIll sign off. Please call with questions.  JLarae Grooms

## 2015-11-29 NOTE — Discharge Summary (Signed)
Physician Discharge Summary  Calvin [name] LZJ:[id number] DOB: November 22, 1932 DOA: 11/25/2015  PCP:  [name], MD  Admit date: 11/25/2015 Discharge date: 11/29/2015  Time spent: 25* minutes  Recommendations for Outpatient Follow-up:  1. Follow up [facility] [place]   Discharge Diagnoses:  Principal Problem:   Acute diastolic heart failure ([place]) Active Problems:   HTN (hypertension)   History of lung cancer   CAD (coronary artery disease), CABG 2007   Diabetes mellitus (HCC)   COPD (chronic obstructive pulmonary disease) (HCC)   Hx of pneumonectomy   Uncontrolled atrial fibrillation/flutter   Hyperlipidemia   Thrombocytopenia (HCC)   Acute on chronic respiratory failure with hypoxia and hypercarbia (HCC)   Pulmonary hypertension (HCC)   Acute on chronic diastolic CHF (congestive heart failure) (HCC)   COPD exacerbation ([place])   Palliative care encounter   Encounter for hospice care discussion   Slow transit constipation   Pain in the chest   Discharge Condition: Stable  Diet recommendation: Low salt diet  Filed Weights   11/27/15 0600 11/28/15 0534 11/29/15 0505  Weight: 90 kg (198 lb 8 oz) 89.6 kg (197 lb 8 oz) 89.7 kg (197 lb 12.8 oz)    History of present illness:  82 y.o. male with medical history significant for long-standing atrial fib and atrial flutter, underlying COPD status post left pneumonectomy lung cancer, chronic hypoxemic and hypercarbic respiratory failure on 2 L oxygen at home, diabetes, hypertension, pulmonary hypertension, CAD, chronic diastolic heart failure, dyslipidemia and chronic thrombocytopenia. Patient was recently discharged on 7/8 after an admission for acute respiratory failure with hypoxemia and hypercarbia requiring transient BiPAP treatment. His hypercarbia was associated with metabolic encephalopathy. During previous hospitalization patient was instructed to decrease preadmission Losartan from 100/25-50/12.5 and he was resumed on his previous  dosages of Cardizem and carvedilol.   . Patient and wife have noticed patient having palpitations especially with activity and home health RN has documented heart rates up into the 130s with activity but quickly reduces down with rest. Cardiologist was notified by home health RN and instructions were to increase patient's diltiazem dosage if the tachycardia was sustained for greater than or equal to 1 hour. Patient has had recurrent episodes of nonsustained tachycardia for the past several weeks. Beginning this past Wednesday he began noticing increased shortness of breath. His nebulizer treatments were increased to 4 times a day without any improvement. His weight has remained stable at 207 pounds (physician office based weight), he has not had any dietary indiscretion. Because he was so short of breath today the wife called the cardiology office to ask if she could turn the oxygen up and instead was instructed to be evaluated at the ER. Concerns were that given his history of hypercarbic respiratory failure he needed to be evaluated prior to adjusting oxygen dosage.  Hospital Course:  Acute on chronic respiratory failure with hypoxia and hypercarbia -Although pulmonary edema/CHF may be playing a role in his shortness of breath I feel this is likely multifactorial including his A. fib with RVR and likely COPD exacerbation contributing - Improved, heart rate has improved with Cardizem 120 mg by mouth twice a day -Sent home on prednisone taper - Continue Lasix 40 mg daily  Acute on chronic diastolic CHF -R>L heart failure -NEG 3.4L since admission. -10/28/2015 discharge weight 201 pounds -11/25/2015 admission weight 199 pounds -Monitor renal function -Daily weights -10/27/2015 echocardiogram EF 60-65%, grade 1 DD, PAP44 -11/27/15 personally reviewed CXR--persistent interstitial markings on R -switch to po lasix-->clinically  euvolemic  COPD exacerbation -Continueby mouth prednisone--continue  to wean ('50mg'$ -->40 in am) -Continue bronchodilators - Prednisone taper  Paroxysmal atrial fibrillation/flutter -HR up to 110-->hold d/c for today -Continue carvedilol and diltiazem -switch to short acting diltiazem and increase dose -Continue apixaban -10/26/2015 TSH 0.800 - Cardiology saw the patient in the hospital, recommended to start Cardizem CD 120 mg by mouth twice a day -And short acting Cardizem 30 mg by mouth every 6 hours when necessary for heart rate greater than 100  Atypical chest discomfort -EKG -cycle troponins- elevated due to tachycardia -likely due to increased HR 8/8  Hypertension -Controlled -Continue carvedilol and losartan  Coronary artery disease -No anginal symptoms -personally reviewed EKG--atrial flutter, nonspecific ST changes -Continue aspirin  Diabetes mellitus type 2 -CBG is elevated secondary to steroids -10/26/2015: A1c 6.2 - continue metformin  Renal artery stenosis  -This has likely been a chronic issue  -Follow up with [facility] in outpt setting -this is contributing to his labile HTN  Hyperlipidemia  -Continue statin   Gait instability and headache -10/27/15--MRI brain--neg for acute findings  History lung cancer status post left pneumonectomy  Procedures:  None   Consultations:  Cardiology   Discharge Exam: Vitals:   11/28/15 2010 11/29/15 0505  BP: (!) 103/59 (!) 150/67  Pulse: 97 90  Resp: 18 18  Temp: 98.4 F (36.9 C) 98 F (36.7 C)    General: Appears in no acte distress Cardiovascular: S1S2 RRR Respiratory: Clear bilaterally  Discharge Instructions   Discharge Instructions    Diet - low sodium heart healthy    Complete by:  As directed   Increase activity slowly    Complete by:  As directed     Current Discharge Medication List    START taking these medications   Details  diltiazem (CARDIZEM) 30 MG tablet Take 1 tablet (30 mg total) by mouth every 6 (six) hours as needed (For HR >  100). Qty: 30 tablet, Refills: 2    furosemide (LASIX) 40 MG tablet Take 1 tablet (40 mg total) by mouth daily. Qty: 30 tablet, Refills: 2    losartan (COZAAR) 25 MG tablet Take 1 tablet (25 mg total) by mouth daily. Qty: 30 tablet, Refills: 2    predniSONE (DELTASONE) 10 MG tablet Prednisone 40 mg po daily x 1 day then Prednisone 30 mg po daily x 1 day then Prednisone 20 mg po daily x 1 day then Prednisone 10 mg daily x 1 day then stop... Qty: 10 tablet, Refills: 0      CONTINUE these medications which have CHANGED   Details  diltiazem (CARDIZEM CD) 120 MG 24 hr capsule Take 1 capsule (120 mg total) by mouth 2 (two) times daily. Qty: 60 capsule, Refills: 2      CONTINUE these medications which have NOT CHANGED   Details  acetaminophen (TYLENOL) 325 MG tablet Take 325 mg by mouth every 6 (six) hours as needed for mild pain, moderate pain or headache. For pain    albuterol (PROVENTIL HFA;VENTOLIN HFA) 108 (90 BASE) MCG/ACT inhaler Inhale 2 puffs into the lungs every 6 (six) hours as needed for wheezing.    apixaban (ELIQUIS) 5 MG TABS tablet Take 1 tablet (5 mg total) by mouth 2 (two) times daily. Qty: 60 tablet, Refills: 0    carvedilol (COREG) 12.5 MG tablet Take 1 tablet (12.5 mg total) by mouth 2 (two) times daily. Qty: 180 tablet, Refills: 1    cetirizine (ZYRTEC) 10 MG tablet Take 10 mg by  mouth daily as needed for allergies or rhinitis. For allergies    cholecalciferol (VITAMIN D) 1000 UNITS tablet Take 2,000 Units by mouth 2 (two) times daily.     docusate sodium (COLACE) 100 MG capsule Take 2 capsules (200 mg total) by mouth daily. Qty: 30 capsule, Refills: 0    EPIPEN 2-PAK 0.3 MG/0.3ML DEVI Inject 0.3 mg into the skin See admin instructions. Take as needed for severe allergic reaction    ipratropium-albuterol (DUONEB) 0.5-2.5 (3) MG/3ML SOLN Take 3 mLs by nebulization 3 (three) times daily. Qty: 270 mL, Refills: 11    magnesium oxide (MAG-OX) 400 MG tablet Take  400 mg by mouth at bedtime.     metFORMIN (GLUCOPHAGE) 500 MG tablet Take 500 mg by mouth daily with breakfast.     Multiple Vitamins-Minerals (MULTIVITAMINS THER. W/MINERALS) TABS Take 1 tablet by mouth daily.    NON FORMULARY Place 2 L into the nose continuous.    Omega-3 Fatty Acids (FISH OIL) 1200 MG CAPS Take 1 capsule by mouth 2 (two) times daily.    polyethylene glycol (MIRALAX / GLYCOLAX) packet Take 17 g by mouth 2 (two) times daily. Qty: 14 each, Refills: 0    triamcinolone cream (KENALOG) 0.1 % Apply 1 application topically 2 (two) times daily as needed (itchin).       STOP taking these medications     losartan-hydrochlorothiazide (HYZAAR) 100-25 MG tablet        Allergies  Allergen Reactions  . Bee Venom Anaphylaxis  . Sulfa Antibiotics   . Amoxicillin Rash  . Statins Other (See Comments)    Muscle aches   Follow-up Information    [facility] .   Why:  they will do your hospice at your home Contact information: [address] [place] [address] [phone]            The results of significant diagnostics from this hospitalization (including imaging, microbiology, ancillary and laboratory) are listed below for reference.    Significant Diagnostic Studies: Dg Chest 2 View  Result Date: 11/26/2015 CLINICAL DATA:  Shortness of breath. EXAM: CHEST  2 VIEW COMPARISON:  November 25, 2015 FINDINGS: The patient is status post left pneumonectomy. The left chest is stable. Hyperexpansion of the right lung. No pneumothorax. Mild interstitial prominence with no focal infiltrate, nodule, or mass. No other interval changes. IMPRESSION: 1. Hyperexpansion of the right lung, stable. Suggested pulmonary venous congestion without overt edema. No focal infiltrate. Electronically Signed   By: Dorise Bullion III M.D   On: 11/26/2015 08:10   Dg Chest 2 View  Result Date: 11/25/2015 CLINICAL DATA:  Shortness of breath for several days. EXAM: CHEST  2 VIEW  COMPARISON:  10/27/2015 FINDINGS: Previous median sternotomy and CABG procedure. Postoperative appearance of the left hemi thorax compatible with previous pneumonectomy. There is a small right pleural effusion and mild interstitial edema. IMPRESSION: 1. Suspect mild CHF. 2. Left pneumonectomy. Electronically Signed   By: Kerby Moors M.D.   On: 11/25/2015 13:46   Dg Chest Port 1 View  Result Date: 11/27/2015 CLINICAL DATA:  Shortness of breath for 2 days EXAM: PORTABLE CHEST 1 VIEW COMPARISON:  11/26/2015 FINDINGS: Right lung is again hyperinflated. The left lung again demonstrates significant calcific changes along the pleural margin with opacification consistent with prior pneumonectomy. No new focal abnormality is seen. IMPRESSION: Changes of prior left pneumonectomy. Hyper expansion of the right lung is noted. Electronically Signed   By: Linus Mako.D.  On: 11/27/2015 16:01    Microbiology: No results found for this or any previous visit (from the past 240 hour(s)).   Labs: Basic Metabolic Panel:  Recent Labs Lab 11/25/15 1210 11/25/15 1835 11/26/15 0416 11/27/15 0328 11/28/15 0219  NA 136  --  138 134* 137  K 4.4  --  3.8 3.9 4.2  CL 90*  --  83* 84* 85*  CO2 41*  --  45* 47* 46*  GLUCOSE 141*  --  159* 176* 175*  BUN 18  --  26* 32* 39*  CREATININE 1.21  --  1.39* 1.27* 1.30*  CALCIUM 9.5  --  9.7 9.3 9.1  MG  --  2.1  --  2.6*  --    CBC:  Recent Labs Lab 11/25/15 1210  WBC 9.4  HGB 9.5*  HCT 32.8*  MCV 93.7  PLT 261   Cardiac Enzymes:  Recent Labs Lab 11/28/15 1356 11/28/15 1802 11/29/15 0024  TROPONINI 0.95* 0.97* 1.02*   BNP: BNP (last 3 results)  Recent Labs  10/28/15 1126 11/25/15 1402 11/28/15 0219  BNP 164.7* 298.7* 235.3*    ProBNP (last 3 results) No results for input(s): PROBNP in the last 8760 hours.  CBG:  Recent Labs Lab 11/28/15 1140 11/28/15 1631 11/28/15 2134 11/29/15 0547 11/29/15 1113  GLUCAP 145* 171* 288* 104*  165*       Signed:  Eleonore Chiquito S MD.  [facility] 11/29/2015, 12:20 PM

## 2015-11-29 NOTE — Progress Notes (Signed)
Pt has orders to be discharged. Discharge instructions given and pt has no additional questions at this time. Medication regimen reviewed and pt educated. Pt verbalized understanding and has no additional questions. Telemetry box removed. IV removed and site in good condition. Pt stable and waiting for transportation.  Eshan Trupiano RN 

## 2015-12-05 ENCOUNTER — Other Ambulatory Visit: Payer: Self-pay

## 2015-12-05 NOTE — Patient Outreach (Signed)
Patient triggered RED on EMMI Heart failure dashboard, notification sent to : Quinn Plowman, RN

## 2015-12-05 NOTE — Patient Outreach (Signed)
[place] [facility]) Care Management  12/05/2015  Calvin [name] Nov 22, 1932 [id number]   REFERRAL DATE; 12/05/15 REFERRAL SOURCE; EMMI heart failure  REFERRAL REASON;  EMMI heart failure red alert:  Did not weigh themselves, new/worsening problem, new worsening shortness of breath  Telephone call to patient regarding EMMI red alert. Unable to reach patient. HIPAA compliant voice message left with call back phone number.    PLAN:  RNCM will attempt 2nd telephone call to patient within  1 week.    Quinn Plowman RN,BSN,CCM [facility] Telephonic  [phone]

## 2015-12-06 ENCOUNTER — Other Ambulatory Visit: Payer: Self-pay

## 2015-12-06 NOTE — Patient Outreach (Signed)
[place] [facility]) Care Management  12/06/2015  Calvin [name] 1932-09-17 [id number]   REFERRAL DATE; 12/05/15 REFERRAL SOURCE; EMMI heart failure  REFERRAL REASON;  EMMI heart failure red alert:  Did not weigh themselves, new/worsening problem, new worsening shortness of breath  Second telephone call to patient regarding EMMI  Heart failure RED alert.  Person answering phone states patient is not available and request call back.  PLAN; RNCM will attempt 3rd telephone call to patient within 1 week.  Quinn Plowman RN,BSN,CCM [facility] Telephonic  [phone]

## 2015-12-07 ENCOUNTER — Other Ambulatory Visit: Payer: Self-pay

## 2015-12-07 NOTE — Patient Outreach (Addendum)
[place] [facility]) Care Management  12/07/2015  ZAEVION PARKE [birth year] [id number]   REFERRAL SOURCE; EMMI heart failure REFERRAL REASON:  EMMI Heart failure red alert CONSENT  SUBJECTIVE: Third telephone call to patient regarding EMMI heart failure red alert. Person answering phone states patient is not available.  HIPAA compliant message left with call back phone number.   PLAN; RNCM will send patient outreach letter to attempt contact with "know before you go sheet."     Quinn Plowman RN,BSN,CCM [facility] Telephonic  [phone]

## 2015-12-14 ENCOUNTER — Telehealth: Payer: Self-pay | Admitting: Interventional Cardiology

## 2015-12-14 ENCOUNTER — Telehealth: Payer: Self-pay | Admitting: Internal Medicine

## 2015-12-14 NOTE — Telephone Encounter (Signed)
New Message:    Wife wanted Dr Irish Lack and Truitt Merle to know that Calvin [name] passed away on 2022-07-30.

## 2015-12-14 NOTE — Telephone Encounter (Addendum)
Will forward message to Dr Irish Lack and Truitt Merle, NP as an Juluis Rainier.

## 2015-12-14 NOTE — Telephone Encounter (Signed)
Dr. Chase Caller. Pt spouse calling to inform you that patient has passed away.

## 2015-12-18 NOTE — Telephone Encounter (Signed)
Please get condolence card

## 2015-12-18 NOTE — Telephone Encounter (Signed)
Per Daneil Dan - Will place card in MR's look at.

## 2015-12-22 NOTE — Telephone Encounter (Signed)
This encounter was created in error - please disregard.

## 2015-12-22 DEATH — deceased

## 2015-12-26 ENCOUNTER — Ambulatory Visit: Payer: Medicare Other | Admitting: Interventional Cardiology

## 2016-02-26 ENCOUNTER — Ambulatory Visit: Payer: Medicare Other | Admitting: Internal Medicine

## 2016-05-23 ENCOUNTER — Encounter: Payer: Medicare Other | Admitting: Cardiothoracic Surgery

## 2016-05-28 ENCOUNTER — Encounter (HOSPITAL_COMMUNITY): Payer: Medicare Other

## 2016-05-28 ENCOUNTER — Ambulatory Visit: Payer: Medicare Other | Admitting: Family

## 2018-02-25 IMAGING — MR MR HEAD W/O CM
11 of 17 series · 31 of 48 positions shown · non-contrast
Comparison: CT 10/26/2015.  MRI 05/18/2011

CLINICAL DATA: Altered mental status and headache. Extremity
weakness.

EXAM:
MRI HEAD WITHOUT CONTRAST
MRI CERVICAL SPINE WITHOUT CONTRAST
TECHNIQUE: Multiplanar, multiecho pulse sequences of the brain and surrounding
structures, and cervical spine, to include the craniocervical
junction and cervicothoracic junction, were obtained without
intravenous contrast.

[Series 4: DWI · axial · 3.0mm · 1.09mm/px · z∈[-15,+129]mm · 7 of 98 slices shown (1 of 4)]
[im 1/98]
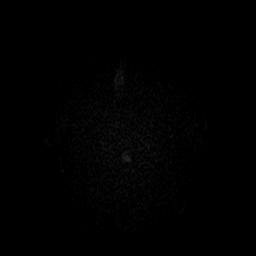
[im 17/98]
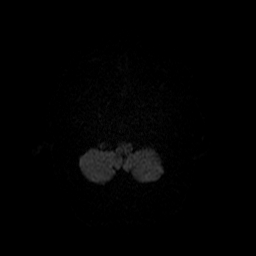
[im 33/98]
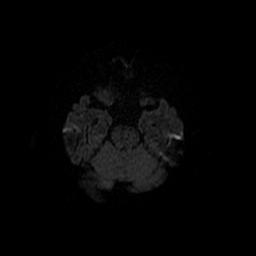
[im 49/98]
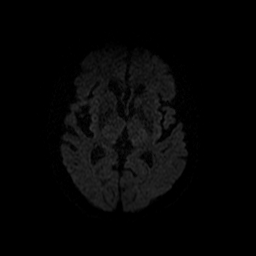
[im 65/98]
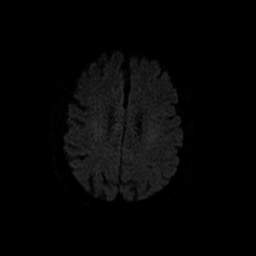
[im 81/98]
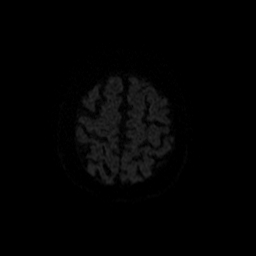
[im 98/98]
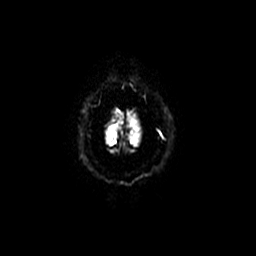

[Series 5: T2 · axial · 5.0mm · 0.47mm/px · z∈[-29,+127]mm · 2 of 27 slices shown (1 of 4)]
[im 1/27]
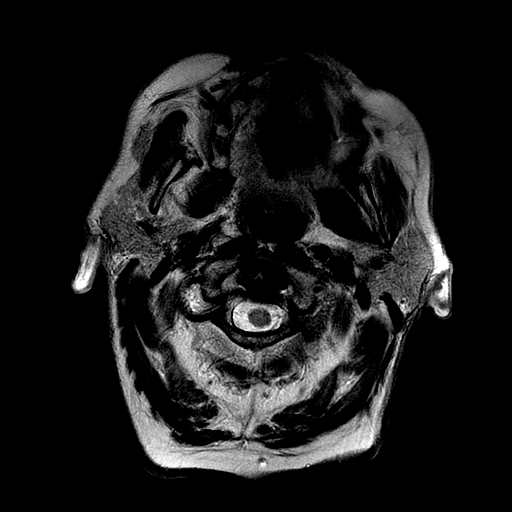
[im 27/27]
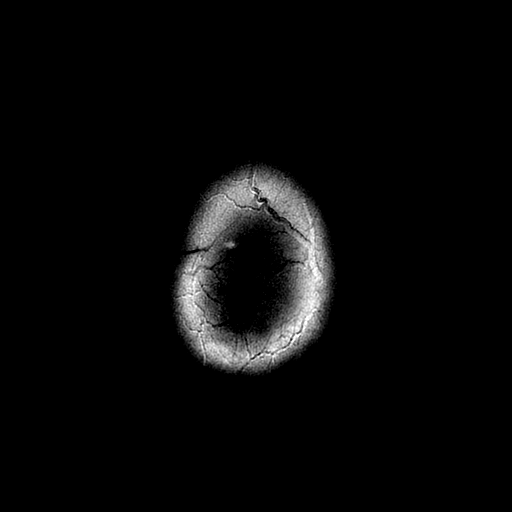

[Series 6: FLAIR · axial · 5.0mm · 0.43mm/px · z∈[-29,+127]mm · 2 of 27 slices shown (1 of 3)]
[im 1/27]
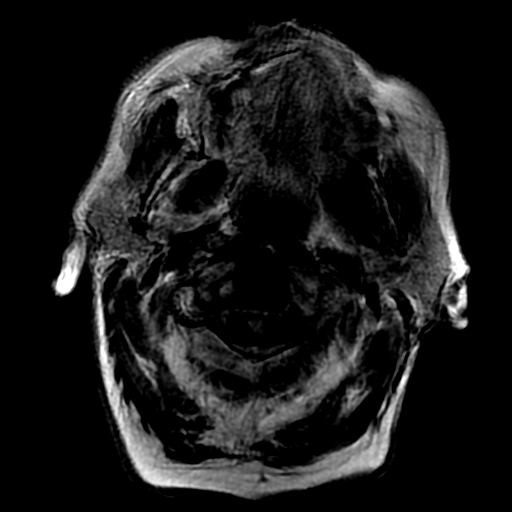
[im 27/27]
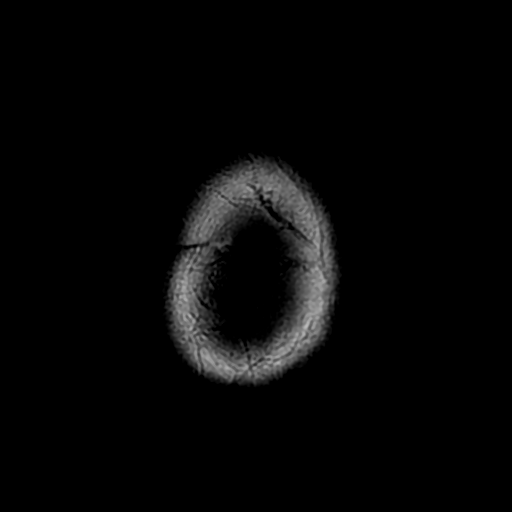

[Series 7: T2 · coronal · 5.0mm · 0.43mm/px · 2 of 31 slices shown (2 of 4)]
[im 1/31]
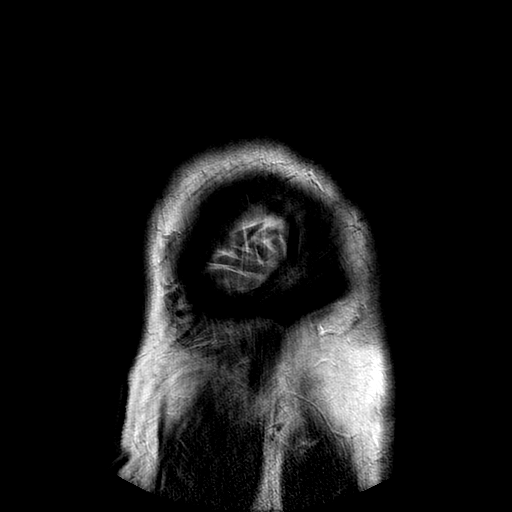
[im 31/31]
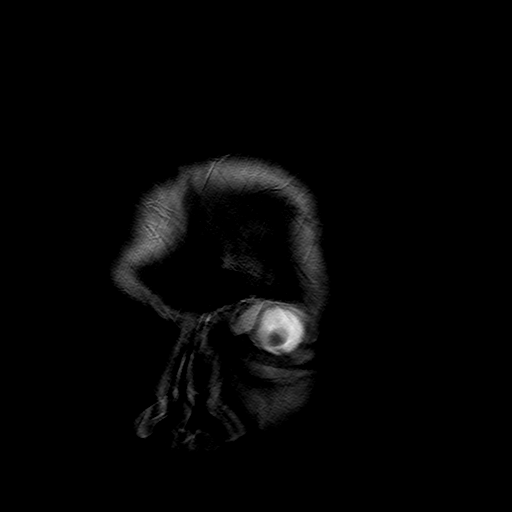

[Series 8: FLAIR · axial · 5.0mm · 0.43mm/px · 1 of 14 slices shown (2 of 3)]
[im 1/14]
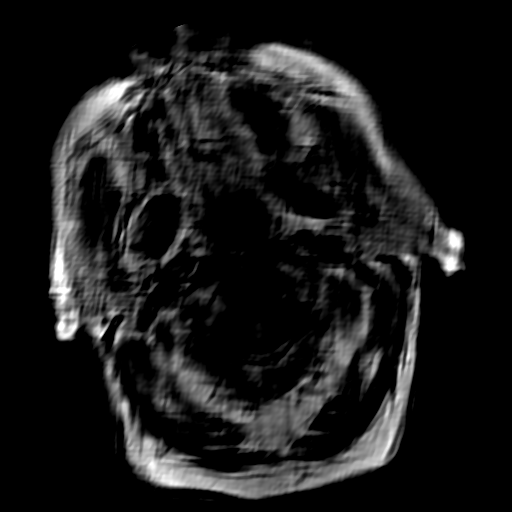

[Series 10: DWI · coronal · 5.0mm · 1.09mm/px · 5 of 72 slices shown (2 of 4)]
[im 1/72]
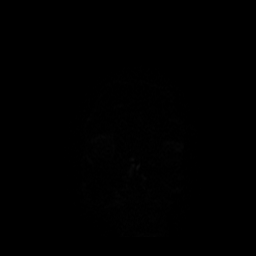
[im 18/72]
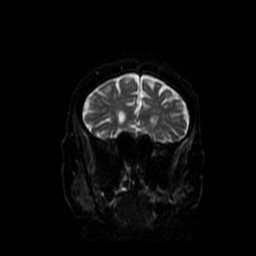
[im 36/72]
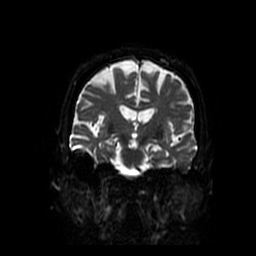
[im 54/72]
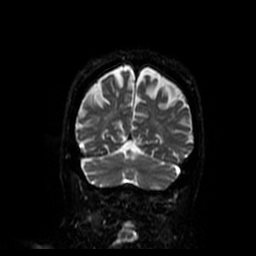
[im 72/72]
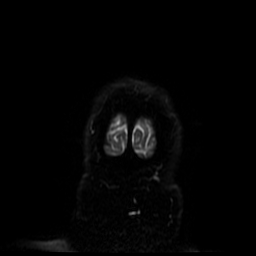

[Series 11: FLAIR · axial · 5.0mm · 0.43mm/px · z∈[-42,+112]mm · 2 of 27 slices shown (3 of 3)]
[im 1/27]
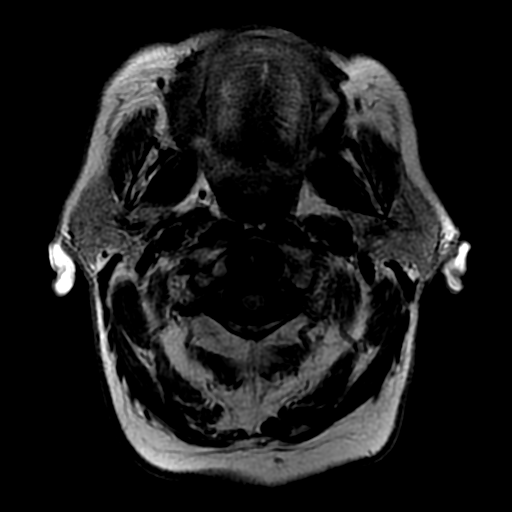
[im 27/27]
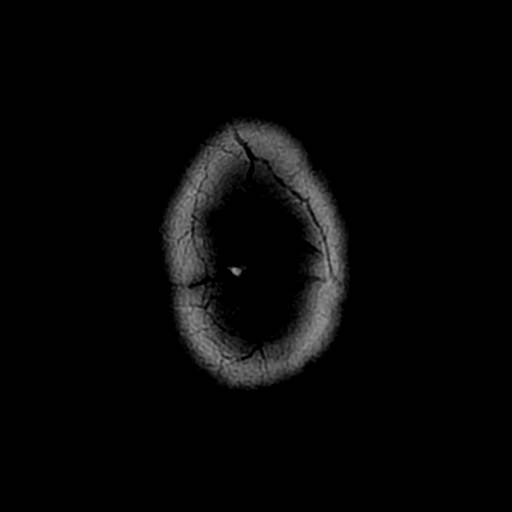

[Series 15: T2 · sagittal · 3.0mm · 0.43mm/px · 1 of 14 slices shown (3 of 4)]
[im 1/14]
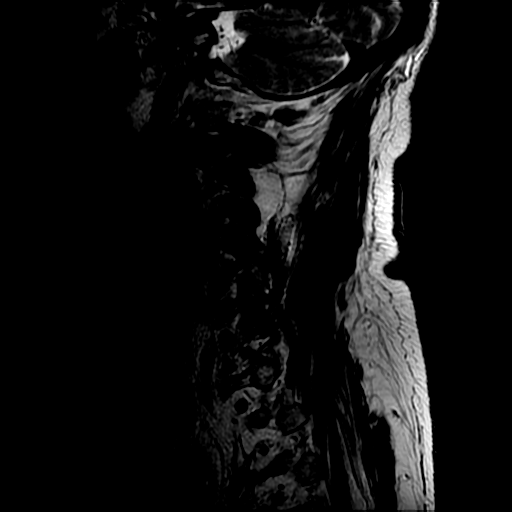

[Series 19: T2 · axial · 3.0mm · 0.39mm/px · z∈[-162,-69]mm · 2 of 30 slices shown (4 of 4)]
[im 1/30]
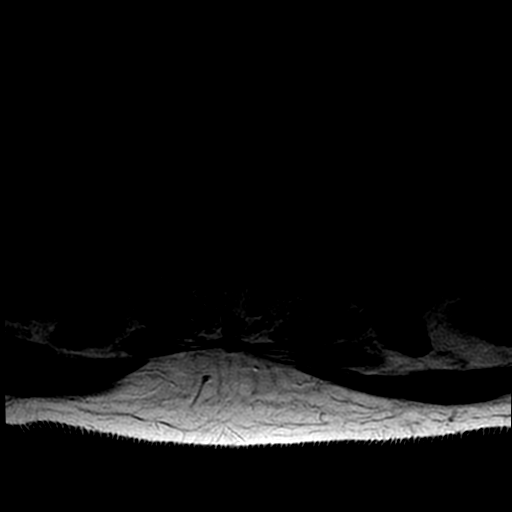
[im 30/30]
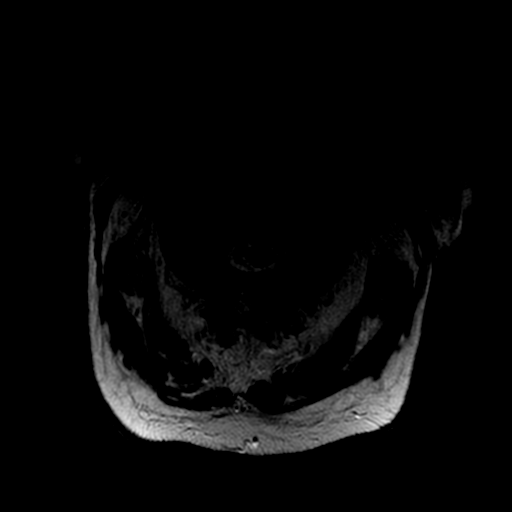

[Series 400: DWI · axial · 3.0mm · 1.09mm/px · z∈[-15,+129]mm · 4 of 49 slices shown (3 of 4)]
[im 1/49]
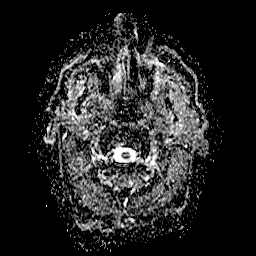
[im 17/49]
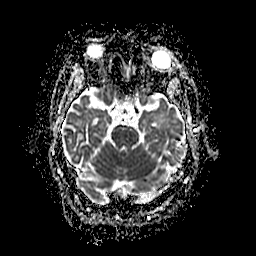
[im 33/49]
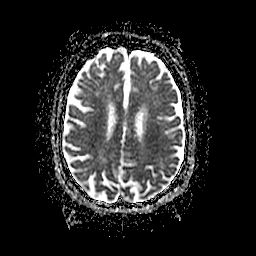
[im 49/49]
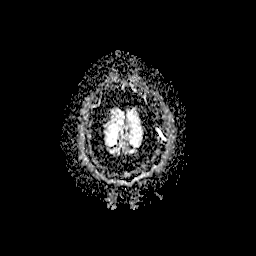

[Series 1000: DWI · coronal · 5.0mm · 1.09mm/px · 3 of 36 slices shown (4 of 4)]
[im 1/36]
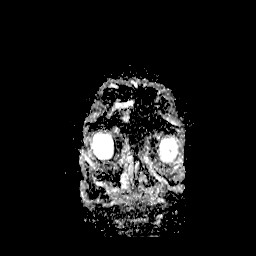
[im 18/36]
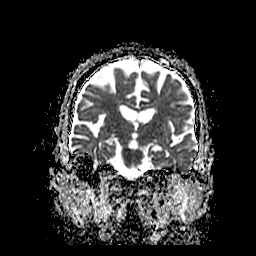
[im 36/36]
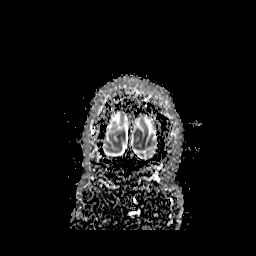

[31 of 48 positions shown; findings below may reference images not displayed]

FINDINGS: MRI HEAD FINDINGS

Image quality degraded by mild motion.

Generalized atrophy.  Negative for hydrocephalus

Negative for acute infarct.

Mild chronic microvascular ischemic changes in the white matter.
Small chronic infarct right caudate and left thalamus. Brainstem and
cerebellum intact.

Negative for intracranial hemorrhage. No mass or edema. No shift of
the midline structures

Paranasal sinuses clear. No orbital lesion. Pituitary not enlarged.

MRI CERVICAL SPINE FINDINGS

Alignment: Normal alignment. Reversal of the cervical lordosis with
mild kyphosis.

Vertebrae: Negative for fracture or mass.  Normal bone marrow.

Cord: No cord lesion identified. Motion degraded images with limited
evaluation of the cord.

Posterior Fossa, vertebral arteries, paraspinal tissues: Negative

Disc levels: C1-2: Degenerative changes C1-C2 without significant
stenosis.

C2-3: Mild disc and facet degeneration. No significant spinal or
foraminal stenosis

C3-4: Moderate disc degeneration and spondylosis with diffuse
uncinate spurring. Bilateral facet hypertrophy. Mild spinal stenosis
and mild foraminal stenosis bilaterally

C4-5: Disc degeneration and mild to moderate spondylosis left
greater than right. Bilateral facet hypertrophy. Moderate left
foraminal encroachment and mild right foraminal encroachment. Mild
spinal stenosis.

C5-6: Disc degeneration and moderate spondylosis. Mild spinal
stenosis and moderate foraminal encroachment bilaterally. Bilateral
facet degeneration

C6-7: Disc degeneration and spondylosis with diffuse uncinate
spurring and bilateral facet degeneration. Mild spinal stenosis and
moderate foraminal encroachment bilaterally

C7-T1:  Mild facet degeneration.
IMPRESSION: Atrophy and mild chronic ischemic change. No acute intracranial
abnormality

Cervical spondylosis. Multilevel disc and facet degeneration and
spurring causing spinal and foraminal encroachment at multiple
levels as described above.
